# Patient Record
Sex: Male | Born: 1935
Health system: Southern US, Community
[De-identification: ages and names within clinical notes are randomized; demographics above are authoritative.]

## PROBLEM LIST (undated history)

## (undated) DIAGNOSIS — I209 Angina pectoris, unspecified: Secondary | ICD-10-CM

## (undated) DIAGNOSIS — I714 Abdominal aortic aneurysm, without rupture, unspecified: Secondary | ICD-10-CM

## (undated) DIAGNOSIS — G4700 Insomnia, unspecified: Secondary | ICD-10-CM

## (undated) DIAGNOSIS — F419 Anxiety disorder, unspecified: Secondary | ICD-10-CM

## (undated) DIAGNOSIS — M1990 Unspecified osteoarthritis, unspecified site: Secondary | ICD-10-CM

## (undated) DIAGNOSIS — K635 Polyp of colon: Secondary | ICD-10-CM

## (undated) DIAGNOSIS — C801 Malignant (primary) neoplasm, unspecified: Secondary | ICD-10-CM

## (undated) DIAGNOSIS — K4090 Unilateral inguinal hernia, without obstruction or gangrene, not specified as recurrent: Secondary | ICD-10-CM

## (undated) DIAGNOSIS — Z8719 Personal history of other diseases of the digestive system: Secondary | ICD-10-CM

## (undated) DIAGNOSIS — E785 Hyperlipidemia, unspecified: Secondary | ICD-10-CM

## (undated) DIAGNOSIS — K5730 Diverticulosis of large intestine without perforation or abscess without bleeding: Secondary | ICD-10-CM

## (undated) DIAGNOSIS — J449 Chronic obstructive pulmonary disease, unspecified: Secondary | ICD-10-CM

## (undated) HISTORY — DX: Hyperlipidemia, unspecified: E78.5

## (undated) HISTORY — PX: HERNIA REPAIR: SHX51

## (undated) HISTORY — DX: Polyp of colon: K63.5

## (undated) HISTORY — DX: Diverticulosis of large intestine without perforation or abscess without bleeding: K57.30

## (undated) HISTORY — DX: Abdominal aortic aneurysm, without rupture: I71.4

## (undated) HISTORY — DX: Anxiety disorder, unspecified: F41.9

## (undated) HISTORY — DX: Insomnia, unspecified: G47.00

## (undated) HISTORY — PX: OTHER SURGICAL HISTORY: SHX169

## (undated) HISTORY — DX: Abdominal aortic aneurysm, without rupture, unspecified: I71.40

## (undated) HISTORY — DX: Unspecified osteoarthritis, unspecified site: M19.90

---

## 1960-10-23 HISTORY — PX: CHOLECYSTECTOMY: SHX55

## 1960-10-23 HISTORY — PX: VASECTOMY: SHX75

## 2005-10-23 HISTORY — PX: COLON SURGERY: SHX602

## 2009-10-23 HISTORY — PX: NASAL SEPTUM SURGERY: SHX37

## 2011-10-31 DIAGNOSIS — F329 Major depressive disorder, single episode, unspecified: Secondary | ICD-10-CM | POA: Diagnosis not present

## 2011-11-09 DIAGNOSIS — F329 Major depressive disorder, single episode, unspecified: Secondary | ICD-10-CM | POA: Diagnosis not present

## 2011-11-24 DIAGNOSIS — F329 Major depressive disorder, single episode, unspecified: Secondary | ICD-10-CM | POA: Diagnosis not present

## 2011-12-08 DIAGNOSIS — F329 Major depressive disorder, single episode, unspecified: Secondary | ICD-10-CM | POA: Diagnosis not present

## 2011-12-14 DIAGNOSIS — H26499 Other secondary cataract, unspecified eye: Secondary | ICD-10-CM | POA: Diagnosis not present

## 2011-12-22 DIAGNOSIS — F329 Major depressive disorder, single episode, unspecified: Secondary | ICD-10-CM | POA: Diagnosis not present

## 2012-01-05 DIAGNOSIS — F329 Major depressive disorder, single episode, unspecified: Secondary | ICD-10-CM | POA: Diagnosis not present

## 2012-01-12 DIAGNOSIS — F329 Major depressive disorder, single episode, unspecified: Secondary | ICD-10-CM | POA: Diagnosis not present

## 2012-03-28 DIAGNOSIS — F329 Major depressive disorder, single episode, unspecified: Secondary | ICD-10-CM | POA: Diagnosis not present

## 2012-04-08 DIAGNOSIS — I6529 Occlusion and stenosis of unspecified carotid artery: Secondary | ICD-10-CM | POA: Diagnosis not present

## 2012-04-08 DIAGNOSIS — I714 Abdominal aortic aneurysm, without rupture: Secondary | ICD-10-CM | POA: Diagnosis not present

## 2012-04-15 DIAGNOSIS — I2510 Atherosclerotic heart disease of native coronary artery without angina pectoris: Secondary | ICD-10-CM | POA: Diagnosis not present

## 2012-04-23 DIAGNOSIS — S6990XA Unspecified injury of unspecified wrist, hand and finger(s), initial encounter: Secondary | ICD-10-CM | POA: Diagnosis not present

## 2012-04-23 DIAGNOSIS — S6980XA Other specified injuries of unspecified wrist, hand and finger(s), initial encounter: Secondary | ICD-10-CM | POA: Diagnosis not present

## 2012-04-23 DIAGNOSIS — S61409A Unspecified open wound of unspecified hand, initial encounter: Secondary | ICD-10-CM | POA: Diagnosis not present

## 2012-04-25 DIAGNOSIS — Z4801 Encounter for change or removal of surgical wound dressing: Secondary | ICD-10-CM | POA: Diagnosis not present

## 2012-05-03 DIAGNOSIS — Z4802 Encounter for removal of sutures: Secondary | ICD-10-CM | POA: Diagnosis not present

## 2012-06-12 DIAGNOSIS — L219 Seborrheic dermatitis, unspecified: Secondary | ICD-10-CM | POA: Diagnosis not present

## 2012-06-12 DIAGNOSIS — D485 Neoplasm of uncertain behavior of skin: Secondary | ICD-10-CM | POA: Diagnosis not present

## 2012-06-12 DIAGNOSIS — L821 Other seborrheic keratosis: Secondary | ICD-10-CM | POA: Diagnosis not present

## 2012-06-12 DIAGNOSIS — B079 Viral wart, unspecified: Secondary | ICD-10-CM | POA: Diagnosis not present

## 2012-06-13 DIAGNOSIS — H26499 Other secondary cataract, unspecified eye: Secondary | ICD-10-CM | POA: Diagnosis not present

## 2012-06-18 DIAGNOSIS — E7800 Pure hypercholesterolemia, unspecified: Secondary | ICD-10-CM | POA: Diagnosis not present

## 2012-06-18 DIAGNOSIS — N181 Chronic kidney disease, stage 1: Secondary | ICD-10-CM | POA: Diagnosis not present

## 2012-06-27 DIAGNOSIS — Z6832 Body mass index (BMI) 32.0-32.9, adult: Secondary | ICD-10-CM | POA: Diagnosis not present

## 2012-06-27 DIAGNOSIS — Z0000 Encounter for general adult medical examination without abnormal findings: Secondary | ICD-10-CM | POA: Diagnosis not present

## 2012-06-27 DIAGNOSIS — H6120 Impacted cerumen, unspecified ear: Secondary | ICD-10-CM | POA: Diagnosis not present

## 2012-06-27 DIAGNOSIS — R9431 Abnormal electrocardiogram [ECG] [EKG]: Secondary | ICD-10-CM | POA: Diagnosis not present

## 2012-06-28 DIAGNOSIS — M19049 Primary osteoarthritis, unspecified hand: Secondary | ICD-10-CM | POA: Diagnosis not present

## 2012-07-11 DIAGNOSIS — Z23 Encounter for immunization: Secondary | ICD-10-CM | POA: Diagnosis not present

## 2012-07-29 DIAGNOSIS — L821 Other seborrheic keratosis: Secondary | ICD-10-CM | POA: Diagnosis not present

## 2012-07-29 DIAGNOSIS — L219 Seborrheic dermatitis, unspecified: Secondary | ICD-10-CM | POA: Diagnosis not present

## 2012-09-03 DIAGNOSIS — L219 Seborrheic dermatitis, unspecified: Secondary | ICD-10-CM | POA: Diagnosis not present

## 2012-09-04 DIAGNOSIS — M7120 Synovial cyst of popliteal space [Baker], unspecified knee: Secondary | ICD-10-CM | POA: Diagnosis not present

## 2012-09-05 DIAGNOSIS — M79609 Pain in unspecified limb: Secondary | ICD-10-CM | POA: Diagnosis not present

## 2012-11-22 ENCOUNTER — Encounter (HOSPITAL_COMMUNITY): Payer: Self-pay | Admitting: *Deleted

## 2012-11-22 ENCOUNTER — Emergency Department (INDEPENDENT_AMBULATORY_CARE_PROVIDER_SITE_OTHER)
Admission: EM | Admit: 2012-11-22 | Discharge: 2012-11-22 | Disposition: A | Discharge status: Home / Self Care | Payer: Medicare Other | Source: Home / Self Care | Attending: Family Medicine | Admitting: Family Medicine

## 2012-11-22 DIAGNOSIS — S335XXA Sprain of ligaments of lumbar spine, initial encounter: Secondary | ICD-10-CM | POA: Diagnosis not present

## 2012-11-22 MED ORDER — IBUPROFEN 800 MG PO TABS: 800.0000 mg | ORAL_TABLET | Freq: Two times a day (BID) | ORAL | Status: DC

## 2012-11-22 MED ORDER — CYCLOBENZAPRINE HCL 5 MG PO TABS: 5.0000 mg | ORAL_TABLET | Freq: Three times a day (TID) | ORAL | Status: DC | PRN

## 2012-11-22 NOTE — ED Notes (Signed)
Pt  Reports  Low  Back   Pain     X  1  Week   He     Reports   He          Lifted  Heavy  Object  1  Week  Ago  Pain on  Movement  And  posistion

## 2012-11-22 NOTE — ED Provider Notes (Signed)
History     CSN: [id number]  Arrival date & time 11/22/12  1107   First MD Initiated Contact with Patient 11/22/12 1153      Chief Complaint  Patient presents with  . Back Pain    (Consider location/radiation/quality/duration/timing/severity/associated sxs/prior treatment) Patient is a 79 y.o. male presenting with back pain. The history is provided by the patient.  Back Pain  This is a new problem. The current episode started more than 2 days ago. The problem has not changed since onset.The pain is associated with lifting heavy objects (reaching nito closet to lift fireproof box and felt sudden low back pain, no gi or gu sx, no radiation of pain.). The pain is present in the lumbar spine. The quality of the pain is described as stabbing. The pain does not radiate. The pain is mild. The symptoms are aggravated by certain positions, twisting and bending. Pertinent negatives include no chest pain, no fever, no numbness, no abdominal pain, no bowel incontinence, no perianal numbness, no bladder incontinence, no pelvic pain, no leg pain, no paresthesias and no tingling. The treatment provided mild relief. Risk factors include lack of exercise.    History reviewed. No pertinent past medical history.  History reviewed. No pertinent past surgical history.  No family history on file.  History  Substance Use Topics  . Smoking status: Not on file  . Smokeless tobacco: Not on file  . Alcohol Use: Not on file      Review of Systems  Constitutional: Negative.  Negative for fever.  Cardiovascular: Negative for chest pain.  Gastrointestinal: Negative.  Negative for abdominal pain and bowel incontinence.  Genitourinary: Negative.  Negative for bladder incontinence and pelvic pain.  Musculoskeletal: Positive for back pain. Negative for myalgias, joint swelling and gait problem.  Neurological: Negative for tingling, numbness and paresthesias.    Allergies  Review of patient's allergies  indicates no known allergies.  Home Medications   Current Outpatient Rx  Name  Route  Sig  Dispense  Refill  . CITALOPRAM HYDROBROMIDE 10 MG PO TABS   Oral   Take 10 mg by mouth daily.         Marland Kitchen LORAZEPAM PO   Oral   Take by mouth.         Marland Kitchen SIMVASTATIN PO   Oral   Take by mouth.         . CYCLOBENZAPRINE HCL 5 MG PO TABS   Oral   Take 1 tablet (5 mg total) by mouth 3 (three) times daily as needed for muscle spasms.   30 tablet   0   . IBUPROFEN 800 MG PO TABS   Oral   Take 1 tablet (800 mg total) by mouth 2 (two) times daily with a meal.   30 tablet   0     BP 146/84  Pulse 74  Temp 98.5 F (36.9 C) (Oral)  Resp 22  SpO2 96%  Physical Exam  Nursing note and vitals reviewed. Constitutional: He is oriented to person, place, and time. He appears well-developed and well-nourished.  Abdominal: Soft. He exhibits no distension. There is no tenderness. There is no rebound and no guarding.  Musculoskeletal: He exhibits tenderness.       Lumbar back: He exhibits decreased range of motion, tenderness, pain and spasm. He exhibits no bony tenderness, no deformity and normal pulse.  Neurological: He is alert and oriented to person, place, and time.  Skin: Skin is warm and dry.  ED Course  Procedures (including critical care time)  Labs Reviewed - No data to display No results found.   1. Lumbar back sprain       MDM  Pt agrees that x-rays not indicated.        Linna Hoff, MD 11/22/12 1242

## 2012-12-18 DIAGNOSIS — E785 Hyperlipidemia, unspecified: Secondary | ICD-10-CM | POA: Diagnosis not present

## 2013-01-12 ENCOUNTER — Other Ambulatory Visit: Payer: Self-pay | Admitting: Nurse Practitioner

## 2013-01-13 ENCOUNTER — Other Ambulatory Visit: Payer: Self-pay | Admitting: *Deleted

## 2013-01-13 ENCOUNTER — Other Ambulatory Visit: Payer: Self-pay | Admitting: Geriatric Medicine

## 2013-01-13 ENCOUNTER — Other Ambulatory Visit: Payer: Self-pay | Admitting: Nurse Practitioner

## 2013-01-13 MED ORDER — TEMAZEPAM 30 MG PO CAPS: ORAL_CAPSULE | ORAL | Status: DC

## 2013-01-13 MED ORDER — CITALOPRAM HYDROBROMIDE 40 MG PO TABS: ORAL_TABLET | ORAL | Status: DC

## 2013-01-17 ENCOUNTER — Other Ambulatory Visit: Payer: Self-pay | Admitting: *Deleted

## 2013-01-17 DIAGNOSIS — E785 Hyperlipidemia, unspecified: Secondary | ICD-10-CM

## 2013-01-17 DIAGNOSIS — G4700 Insomnia, unspecified: Secondary | ICD-10-CM

## 2013-01-27 ENCOUNTER — Other Ambulatory Visit: Payer: Self-pay

## 2013-01-29 ENCOUNTER — Encounter: Payer: Self-pay | Admitting: Internal Medicine

## 2013-01-29 ENCOUNTER — Ambulatory Visit (INDEPENDENT_AMBULATORY_CARE_PROVIDER_SITE_OTHER): Payer: Medicare Other | Admitting: Internal Medicine

## 2013-01-29 VITALS — BP 118/78 | HR 60 | Temp 97.8°F | Resp 16 | Ht 71.0 in | Wt 197.0 lb

## 2013-01-29 DIAGNOSIS — N62 Hypertrophy of breast: Secondary | ICD-10-CM | POA: Diagnosis not present

## 2013-01-29 DIAGNOSIS — E785 Hyperlipidemia, unspecified: Secondary | ICD-10-CM | POA: Diagnosis not present

## 2013-01-29 DIAGNOSIS — F329 Major depressive disorder, single episode, unspecified: Secondary | ICD-10-CM | POA: Diagnosis not present

## 2013-01-29 DIAGNOSIS — F411 Generalized anxiety disorder: Secondary | ICD-10-CM

## 2013-01-29 DIAGNOSIS — F3289 Other specified depressive episodes: Secondary | ICD-10-CM

## 2013-01-29 NOTE — Progress Notes (Signed)
  Subjective:    Patient ID: Andrew Vega, male    DOB: 1926/01/21, 79 y.o.   MRN: [id number]  Chief Complaint  Patient presents with  . Anxiety  . Hyperlipidemia   HPI Patient here for routine follow up. Follows with Candelaria Celeste, NP.  He has noticed his left breast to be enlarging. Denies any pain or redness. No discharge reported. No known trauma. Denies any other complaints this visit. Reviewed his medications.   Review of Systems  Constitutional: Negative for fever and chills.  HENT: Positive for postnasal drip. Negative for congestion and rhinorrhea.   Eyes: Negative for visual disturbance.  Respiratory: Negative for cough, shortness of breath and wheezing.   Cardiovascular: Negative for chest pain, palpitations and leg swelling.  Gastrointestinal: Negative for nausea, vomiting, abdominal pain and constipation.  Genitourinary: Positive for urgency. Negative for dysuria, frequency and difficulty urinating.       Known history of enlarged prostate  Musculoskeletal: Negative for arthralgias.  Skin: Negative for rash and wound.  Neurological: Negative for dizziness, seizures and syncope.  Hematological: Negative for adenopathy.  Psychiatric/Behavioral: Negative for suicidal ideas, sleep disturbance, self-injury and agitation. The patient is nervous/anxious.        Objective:   Physical Exam  Constitutional: He is oriented to person, place, and time. He appears well-developed and well-nourished. No distress.  HENT:  Head: Normocephalic and atraumatic.  Mouth/Throat: Oropharynx is clear and moist.  Eyes: Conjunctivae are normal. Pupils are equal, round, and reactive to light.  Neck: Normal range of motion. Neck supple.  Cardiovascular: Normal rate and regular rhythm.   Pulmonary/Chest: Effort normal and breath sounds normal.  gynaecomastia in left breast. No redness. No tenderness  Abdominal: Soft. Bowel sounds are normal.  Musculoskeletal: Normal range of motion. He  exhibits no edema and no tenderness.  Neurological: He is alert and oriented to person, place, and time.  Skin: Skin is warm and dry. No rash noted. He is not diaphoretic.  Psychiatric: He has a normal mood and affect.   BP 118/78  Pulse 60  Temp(Src) 97.8 F (36.6 C) (Oral)  Resp 16  Ht 5\' 11"  (1.803 m)  Wt 197 lb (89.359 kg)  BMI 27.49 kg/m2  SpO2 97%       Assessment & Plan:   Anxiety- continue current regimen on temazepam and lorazepam prn.  Depression- on celexa and tolerating it well. Continue current regimen  gynaecomastia- new finding. No medication to contribute to this. Will get left breast usg to rule out mass/ cyst  Hyperlipidemia- continue simvastatin for now. Will need fasting labs prior to next visit for review

## 2013-01-31 NOTE — Addendum Note (Signed)
Addended by: Buena Irish A on: 01/31/2013 04:30 PM   Modules accepted: Orders

## 2013-02-13 ENCOUNTER — Ambulatory Visit
Admission: RE | Admit: 2013-02-13 | Discharge: 2013-02-13 | Disposition: A | Discharge status: Home / Self Care | Payer: Medicare Other | Source: Ambulatory Visit | Attending: Internal Medicine | Admitting: Internal Medicine

## 2013-02-13 DIAGNOSIS — N62 Hypertrophy of breast: Secondary | ICD-10-CM

## 2013-03-03 DIAGNOSIS — F4322 Adjustment disorder with anxiety: Secondary | ICD-10-CM | POA: Diagnosis not present

## 2013-03-12 ENCOUNTER — Other Ambulatory Visit: Payer: Self-pay | Admitting: *Deleted

## 2013-03-12 DIAGNOSIS — F4322 Adjustment disorder with anxiety: Secondary | ICD-10-CM | POA: Diagnosis not present

## 2013-03-12 MED ORDER — SIMVASTATIN 40 MG PO TABS: ORAL_TABLET | ORAL | Status: DC

## 2013-03-19 DIAGNOSIS — F4322 Adjustment disorder with anxiety: Secondary | ICD-10-CM | POA: Diagnosis not present

## 2013-03-20 ENCOUNTER — Ambulatory Visit (INDEPENDENT_AMBULATORY_CARE_PROVIDER_SITE_OTHER): Payer: Medicare Other | Admitting: Nurse Practitioner

## 2013-03-20 ENCOUNTER — Encounter: Payer: Self-pay | Admitting: Nurse Practitioner

## 2013-03-20 VITALS — BP 138/90 | HR 76 | Temp 97.0°F | Resp 14 | Ht 71.0 in | Wt 199.0 lb

## 2013-03-20 DIAGNOSIS — G4700 Insomnia, unspecified: Secondary | ICD-10-CM

## 2013-03-20 DIAGNOSIS — R5381 Other malaise: Secondary | ICD-10-CM

## 2013-03-20 DIAGNOSIS — F411 Generalized anxiety disorder: Secondary | ICD-10-CM

## 2013-03-20 DIAGNOSIS — E785 Hyperlipidemia, unspecified: Secondary | ICD-10-CM | POA: Insufficient documentation

## 2013-03-20 DIAGNOSIS — R5383 Other fatigue: Secondary | ICD-10-CM | POA: Diagnosis not present

## 2013-03-20 DIAGNOSIS — J309 Allergic rhinitis, unspecified: Secondary | ICD-10-CM

## 2013-03-20 DIAGNOSIS — I714 Abdominal aortic aneurysm, without rupture, unspecified: Secondary | ICD-10-CM

## 2013-03-20 DIAGNOSIS — Z1211 Encounter for screening for malignant neoplasm of colon: Secondary | ICD-10-CM

## 2013-03-20 MED ORDER — MOMETASONE FUROATE 50 MCG/ACT NA SUSP: 2.0000 | Freq: Every day | NASAL | Status: DC

## 2013-03-20 MED ORDER — SILDENAFIL CITRATE 50 MG PO TABS: ORAL_TABLET | ORAL | Status: DC

## 2013-03-20 NOTE — Patient Instructions (Signed)
Will order [place] of aneurysm.  Will get referral to GI to do follow up colonoscopy  Will decreased celexa to 1/2 tablet for 1 week then stop Will start Viibryd titration pack for anxiety  To come back and get fasting blood work- nothing to eat or drink except water and black coffee after midnight the night before.   To follow up in 1 month regarding anxiety

## 2013-03-20 NOTE — Assessment & Plan Note (Signed)
History of AAA- however have not seen records from previous office-- will get follow up US to cont to monitor aneurysm

## 2013-03-20 NOTE — Progress Notes (Signed)
Patient ID: Andrew Vega, male   DOB: 03-05-1926, 79 y.o.   MRN: [id number]   No Known Allergies  Chief Complaint  Patient presents with  . Medical Managment of Chronic Issues    sleeping problems    HPI: Patient is a 79 y.o. male seen in the office today for extended visit.  Anxiety is worse due to taking care of wife and her advanced dementia with behaviors. Reports he feels overwhelmed by her and the care. Has caregivers which help. Is now seeing a therapist to help him cope.  Has a history of AA and yearly screening was due jan 2014 Blood pressure normally 120s/80s  Has been having increased seasonal allergies- OTCs were not effective has used nasonex in the past Review of Systems:  Review of Systems  Constitutional: Negative for fever, chills and malaise/fatigue.  HENT: Negative for ear pain, nosebleeds, congestion and sore throat.   Eyes: Negative for blurred vision.  Respiratory: Negative for cough, sputum production and shortness of breath.   Cardiovascular: Negative for chest pain and palpitations.  Gastrointestinal: Negative for heartburn, abdominal pain, diarrhea and constipation.  Genitourinary: Negative for dysuria, urgency and frequency.  Musculoskeletal: Negative for myalgias and joint pain.  Skin: Negative for itching and rash.  Neurological: Negative for dizziness, tingling, tremors, weakness and headaches.  Endo/Heme/Allergies: Positive for environmental allergies.  Psychiatric/Behavioral: Negative for depression. The patient is nervous/anxious and has insomnia.      Past Medical History  Diagnosis Date  . Anxiety   . Hyperlipidemia   . Abdominal aneurysm without mention of rupture   . Insomnia, unspecified    Past Surgical History  Procedure Laterality Date  . Vasectomy    . Cholecystectomy    . Colon surgery     Social History:   reports that he has quit smoking. He does not have any smokeless tobacco history on file. He reports that he drinks about  0.6 ounces of alcohol per week. He reports that he does not use illicit drugs.  No family history on file.  Medications: Patient's Medications  New Prescriptions   MOMETASONE (NASONEX) 50 MCG/ACT NASAL SPRAY    Place 2 sprays into the nose daily.   SILDENAFIL (VIAGRA) 50 MG TABLET    1/2 tablet to 1 tablet as needed daily for ED  Previous Medications   ASPIRIN 81 MG TABLET    Take 81 mg by mouth daily.   CITALOPRAM (CELEXA) 40 MG TABLET    Take one tablet by mouth daily.   LORAZEPAM PO    Take 0.5 mg by mouth.    NIACIN 100 MG TABLET    Take 100 mg by mouth. Take 3 tablets by mouth every morning with breakfast.   SIMVASTATIN (ZOCOR) 40 MG TABLET    Take one tablet once a day at bedtime for cholesterol supplement   TEMAZEPAM (RESTORIL) 30 MG CAPSULE    Take one capsule by mouth once daily at bedtime for rest  Modified Medications   No medications on file  Discontinued Medications   IBUPROFEN (ADVIL,MOTRIN) 800 MG TABLET    Take 1 tablet (800 mg total) by mouth 2 (two) times daily with a meal.     Physical Exam:  Filed Vitals:   03/20/13 1137  BP: 138/90  Pulse: 76  Temp: 97 F (36.1 C)  TempSrc: Oral  Resp: 14  Height: 5\' 11"  (1.803 m)  Weight: 199 lb (90.266 kg)    Physical Exam  Constitutional: He is oriented to person,  place, and time. He appears well-developed and well-nourished. No distress.  HENT:  Head: Normocephalic and atraumatic.  Right Ear: External ear normal.  Left Ear: External ear normal.  Nose: Nose normal.  Mouth/Throat: No oropharyngeal exudate.  Eyes: Conjunctivae and EOM are normal. Pupils are equal, round, and reactive to light.  Neck: Normal range of motion. Neck supple. No thyromegaly present.  Cardiovascular: Normal rate, regular rhythm, normal heart sounds and intact distal pulses.   Pulmonary/Chest: Effort normal and breath sounds normal.  Abdominal: Soft. Bowel sounds are normal. He exhibits no distension. There is no tenderness.   Genitourinary: Rectum normal. Prostate is enlarged.  Musculoskeletal: Normal range of motion. He exhibits no edema and no tenderness.  Neurological: He is alert and oriented to person, place, and time. He has normal reflexes. No cranial nerve deficit. Coordination normal.  Skin: Skin is warm and dry. He is not diaphoretic.  Psychiatric: He has a normal mood and affect.       Assessment/Plan Insomnia Waking up in the middle of the night and watching TV without sound and just staying up for about 30 mins. Worse due to increase in anxiety. Pt can take Melatoin 3 mg qhs for sleep  Generalized anxiety disorder Wife with advanced dementia with behviors and pt has a lot of anxiety and stress due to this-- decrease energy level; started going to therapy to help with the stress and fatigue. He reports his anxiety has improved due to this however would like to try different medications. Will titrate celexa off and start viibryd titration pack. To follow up in 4 weeks  Other and unspecified hyperlipidemia Not fasting- Will have him come for fasting labs next week  AAA (abdominal aortic aneurysm) without rupture History of AAA- however have not seen records from previous office-- will get follow up US to cont to monitor aneurysm   Allergic rhinitis Has uses nasonex in the past with good result. Will give Rx at this time. OTCs were not effective     Other malaise and fatigue 780.79   -- due to caregiver role strain and increase anxiety- will check labs and changed medications to help anxiety  Screening for colon cancer - will refer to GI for follow up colonoscopy   Labs/tests ordered TSH, fasting lipids, CMP, CBC

## 2013-03-20 NOTE — Assessment & Plan Note (Addendum)
Waking up in the middle of the night and watching TV without sound and just staying up for about 30 mins. Worse due to increase in anxiety. Pt can take Melatoin 3 mg qhs for sleep

## 2013-03-20 NOTE — Assessment & Plan Note (Signed)
Has uses nasonex in the past with good result. Will give Rx at this time. OTCs were not effective

## 2013-03-20 NOTE — Assessment & Plan Note (Addendum)
Wife with advanced dementia with behviors and pt has a lot of anxiety and stress due to this-- decrease energy level; started going to therapy to help with the stress and fatigue. He reports his anxiety has improved due to this however would like to try different medications. Will titrate celexa off and start viibryd titration pack. To follow up in 4 weeks

## 2013-03-20 NOTE — Assessment & Plan Note (Signed)
Not fasting- Will have him come for fasting labs next week

## 2013-03-20 NOTE — Progress Notes (Signed)
Passed clock drawing. Given by Salem Caster, RMA.

## 2013-03-21 ENCOUNTER — Encounter: Payer: Self-pay | Admitting: Gastroenterology

## 2013-04-02 ENCOUNTER — Telehealth: Payer: Self-pay

## 2013-04-02 NOTE — Telephone Encounter (Signed)
Pt appt left as scheduled

## 2013-04-02 NOTE — Telephone Encounter (Signed)
Message copied by Donata Duff on Wed Apr 02, 2013 11:45 AM ------      Message from: Rob Bunting P      Created: Wed Apr 02, 2013 11:37 AM       Happy to see him in office to discuss whether or not colon cancer screening is still relevant for him.                  ----- Message -----         From: Donata Duff, CMA         Sent: 04/02/2013  11:27 AM           To: Rachael Fee, MD            Dr Christella Hartigan this pt has been scheduled for eval for screening colon he is 57 does he need this appt?       ------

## 2013-04-03 DIAGNOSIS — F4322 Adjustment disorder with anxiety: Secondary | ICD-10-CM | POA: Diagnosis not present

## 2013-04-07 ENCOUNTER — Encounter (INDEPENDENT_AMBULATORY_CARE_PROVIDER_SITE_OTHER): Payer: Medicare Other

## 2013-04-07 DIAGNOSIS — I714 Abdominal aortic aneurysm, without rupture, unspecified: Secondary | ICD-10-CM

## 2013-04-07 DIAGNOSIS — I723 Aneurysm of iliac artery: Secondary | ICD-10-CM | POA: Diagnosis not present

## 2013-04-10 DIAGNOSIS — F4322 Adjustment disorder with anxiety: Secondary | ICD-10-CM | POA: Diagnosis not present

## 2013-04-11 ENCOUNTER — Other Ambulatory Visit: Payer: Medicare Other

## 2013-04-11 DIAGNOSIS — R5383 Other fatigue: Secondary | ICD-10-CM

## 2013-04-11 DIAGNOSIS — E785 Hyperlipidemia, unspecified: Secondary | ICD-10-CM

## 2013-04-12 LAB — LIPID PANEL
Chol/HDL Ratio: 3.2 ratio units (ref 0.0–5.0)
HDL: 50 mg/dL (ref >39)
LDL Calculated: 86 mg/dL (ref 0–99)
Triglycerides: 121 mg/dL (ref 0–149)
VLDL Cholesterol Cal: 24 mg/dL (ref 5–40)

## 2013-04-12 LAB — CBC WITH DIFFERENTIAL/PLATELET
Basophils Absolute: 0.1 10*3/uL (ref 0.0–0.2)
Eos: 6 % — ABNORMAL HIGH (ref 0–5)
HCT: 40.8 % (ref 37.5–51.0)
Immature Grans (Abs): 0 10*3/uL (ref 0.0–0.1)
Immature Granulocytes: 0 % (ref 0–2)
Lymphocytes Absolute: 1.9 10*3/uL (ref 0.7–3.1)
MCHC: 35.5 g/dL (ref 31.5–35.7)
Monocytes: 9 % (ref 4–12)
Neutrophils Relative %: 59 % (ref 40–74)
RDW: 14.4 % (ref 12.3–15.4)
WBC: 7.6 10*3/uL (ref 3.4–10.8)

## 2013-04-12 LAB — COMPREHENSIVE METABOLIC PANEL
AST: 24 IU/L (ref 0–40)
Albumin: 4.3 g/dL (ref 3.5–4.7)
Alkaline Phosphatase: 73 IU/L (ref 39–117)
BUN/Creatinine Ratio: 12 (ref 10–22)
BUN: 17 mg/dL (ref 8–27)
CO2: 26 mmol/L (ref 18–29)
Calcium: 9.3 mg/dL (ref 8.6–10.2)
Chloride: 104 mmol/L (ref 97–108)
Creatinine, Ser: 1.4 mg/dL — ABNORMAL HIGH (ref 0.76–1.27)
Sodium: 143 mmol/L (ref 134–144)

## 2013-04-14 ENCOUNTER — Ambulatory Visit (INDEPENDENT_AMBULATORY_CARE_PROVIDER_SITE_OTHER): Payer: Medicare Other | Admitting: Gastroenterology

## 2013-04-14 ENCOUNTER — Encounter: Payer: Self-pay | Admitting: Gastroenterology

## 2013-04-14 VITALS — BP 140/80 | HR 70 | Ht 67.0 in | Wt 202.0 lb

## 2013-04-14 DIAGNOSIS — Z86010 Personal history of colonic polyps: Secondary | ICD-10-CM | POA: Diagnosis not present

## 2013-04-14 NOTE — Patient Instructions (Addendum)
We will get records sent from your previous gastroenterologist for review (Dr. Estelle Grumbles in [facility], [place] [phone].  This will include any endoscopic (colonoscopy or upper endoscopy) procedures and any associated pathology reports.   Will decide on timing of colonoscopy after that review. Start fiber supplement powder every day.                                              We are excited to introduce MyChart, a new best-in-class service that provides you online access to important information in your electronic medical record. We want to make it easier for you to view your health information - all in one secure location - when and where you need it. We expect MyChart will enhance the quality of care and service we provide.  When you register for MyChart, you can:    View your test results.    Request appointments and receive appointment reminders via email.    Request medication renewals.    View your medical history, allergies, medications and immunizations.    Communicate with your physician's office through a password-protected site.    Conveniently print information such as your medication lists.  To find out if MyChart is right for you, please talk to a member of our clinical staff today. We will gladly answer your questions about this free health and wellness tool.  If you are age 33 or older and want a member of your family to have access to your record, you must provide written consent by completing a proxy form available at our office. Please speak to our clinical staff about guidelines regarding accounts for patients younger than age 87.  As you activate your MyChart account and need any technical assistance, please call the MyChart technical support line at [phone]) or email your question to [email or web address]. If you email your question(s), please include your name, a return phone number and the best time to reach you.  If you have  non-urgent health-related questions, you can send a message to our office through MyChart at [email or web address]. If you have a medical emergency, call 911.  Thank you for using MyChart as your new health and wellness resource!   MyChart licensed from [employer],  6213-0865. Patents Pending.

## 2013-04-14 NOTE — Progress Notes (Signed)
HPI: This is a   very pleasant 79 year old man whom I am meeting for the first time today.  He recently moved from [place] with his wife who has Alzheimer's disease to be closer to family   He was recently seen by his primary care physician and was recommended to consider colon cancer screening and was sent here. Labs in the last month show that he is not anemic.  He had colonoscopies in   Colon resection surgically, long time ago for polyp that could not be removed endoscopically..  This was about 2007 years ago in [facility].  Multiple colonoscopies, last was 2010, p    Review of systems: Pertinent positive and negative review of systems were noted in the above HPI section. Complete review of systems was performed and was otherwise normal.    Past Medical History  Diagnosis Date  . Anxiety   . Hyperlipidemia   . Abdominal aneurysm without mention of rupture   . Insomnia, unspecified   . Colon polyps   . Arthritis   . Diverticulosis of colon (without mention of hemorrhage)   . Sleep apnea     Past Surgical History  Procedure Laterality Date  . Vasectomy    . Cholecystectomy    . Colon surgery      Current Outpatient Prescriptions  Medication Sig Dispense Refill  . aspirin 81 MG tablet Take 81 mg by mouth daily.      . B Complex-C (SUPER B COMPLEX PO) Take by mouth daily.      . Cholecalciferol (VITAMIN D) 2000 UNITS CAPS Take by mouth daily.      . citalopram (CELEXA) 40 MG tablet Take one tablet by mouth daily.  30 tablet  3  . Glucosamine-Chondroit-Vit C-Mn (GLUCOSAMINE CHONDR 1500 COMPLX PO) Take 1 capsule by mouth daily.      Marland Kitchen LORAZEPAM PO Take 0.5 mg by mouth.       . mometasone (NASONEX) 50 MCG/ACT nasal spray Place 2 sprays into the nose daily.  17 g  12  . Multiple Vitamin (MULTIVITAMIN) tablet Take 1 tablet by mouth daily.      . niacin 100 MG tablet Take 100 mg by mouth. Take 3 tablets by mouth every morning with breakfast.      . Omega-3 Fatty Acids (FISH  OIL) 1000 MG CAPS Take 1 capsule by mouth daily.      . sildenafil (VIAGRA) 50 MG tablet 1/2 tablet to 1 tablet as needed daily for ED  15 tablet  0  . simvastatin (ZOCOR) 40 MG tablet Take one tablet once a day at bedtime for cholesterol supplement  90 tablet  3  . temazepam (RESTORIL) 30 MG capsule Take one capsule by mouth once daily at bedtime for rest  30 capsule  0  . Vilazodone HCl (VIIBRYD) 40 MG TABS Take 40 mg by mouth daily.      . vitamin A 45409 UNIT capsule Take 10,000 Units by mouth daily.      . vitamin E 400 UNIT capsule Take 400 Units by mouth daily.       No current facility-administered medications for this visit.    Allergies as of 04/14/2013 - Review Complete 04/14/2013  Allergen Reaction Noted  . Codeine  04/14/2013    Family History  Problem Relation Age of Onset  . Colon cancer Neg Hx     History   Social History  . Marital Status: Married    Spouse Name: N/A  Number of Children: N/A  . Years of Education: N/A   Occupational History  . Not on file.   Social History Main Topics  . Smoking status: Former Games developer  . Smokeless tobacco: Never Used  . Alcohol Use: 0.6 oz/week    1 Glasses of wine per week  . Drug Use: No  . Sexually Active: Not on file   Other Topics Concern  . Not on file   Social History Narrative  . No narrative on file       Physical Exam: BP 140/80  Pulse 70  Ht 5\' 7"  (1.702 m)  Wt 202 lb (91.627 kg)  BMI 31.63 kg/m2 Constitutional: generally well-appearing Psychiatric: alert and oriented x3 Eyes: extraocular movements intact Mouth: oral pharynx moist, no lesions Neck: supple no lymphadenopathy Cardiovascular: heart regular rate and rhythm Lungs: clear to auscultation bilaterally Abdomen: soft, nontender, nondistended, no obvious ascites, no peritoneal signs, normal bowel sounds Extremities: no lower extremity edema bilaterally Skin: no lesions on visible extremities    Assessment and plan: 79 y.o. male  with  personal history of colon polyps, unclear pathology  We will get records sent over from his previous gastroenterologist. His last colonoscopy was in 2010 and he recalls that it was normal, no polyps were found. He did have previous polyps that I believe were adenomatous however I do not have that documentation here for review. He would not generally be due for polyp surveillance colonoscopy until 5 years after his last procedure which feet 2015. He is 79 years old but he is remarkably vibrant and vital. His mother's side of the family lived well into their 90s usually and so I think colon cancer screening, colon polyp surveillance is still a relevant question for him.

## 2013-04-15 ENCOUNTER — Ambulatory Visit (INDEPENDENT_AMBULATORY_CARE_PROVIDER_SITE_OTHER): Payer: Medicare Other | Admitting: Nurse Practitioner

## 2013-04-15 ENCOUNTER — Encounter: Payer: Self-pay | Admitting: Nurse Practitioner

## 2013-04-15 VITALS — BP 130/88 | HR 67 | Temp 97.7°F | Resp 14 | Ht 67.0 in | Wt 201.0 lb

## 2013-04-15 DIAGNOSIS — J309 Allergic rhinitis, unspecified: Secondary | ICD-10-CM

## 2013-04-15 DIAGNOSIS — H1045 Other chronic allergic conjunctivitis: Secondary | ICD-10-CM

## 2013-04-15 DIAGNOSIS — G4700 Insomnia, unspecified: Secondary | ICD-10-CM

## 2013-04-15 DIAGNOSIS — I714 Abdominal aortic aneurysm, without rupture: Secondary | ICD-10-CM | POA: Diagnosis not present

## 2013-04-15 DIAGNOSIS — F411 Generalized anxiety disorder: Secondary | ICD-10-CM

## 2013-04-15 DIAGNOSIS — H1013 Acute atopic conjunctivitis, bilateral: Secondary | ICD-10-CM

## 2013-04-15 MED ORDER — SILDENAFIL CITRATE 50 MG PO TABS: ORAL_TABLET | ORAL | Status: DC

## 2013-04-15 MED ORDER — OLOPATADINE HCL 0.2 % OP SOLN: 1.0000 [drp] | Freq: Every day | OPHTHALMIC | Status: DC

## 2013-04-15 MED ORDER — VILAZODONE HCL 40 MG PO TABS: 40.0000 mg | ORAL_TABLET | Freq: Every day | ORAL | Status: DC

## 2013-04-15 NOTE — Patient Instructions (Signed)
Cont Viibryd 40 mg daily for anxiety  Melatonin 3 mg at night to help with sleep  For allergies: Start eye drop daily claritin (loratadine) 10 mg daily  mucinex DM 1 tablet twice a day with a full glass of water to help with congestion  Hay Fever  Hay fever is a type of allergy that people have to things like grass, animals, or pollen from plants and flowers. It cannot be passed from one person to another. You cannot cure hay fever, but there are things that may help relieve your problems (symptoms). HOME CARE  Avoid the things that may be causing your problems.  Take all medicine as told by your doctor. GET HELP RIGHT AWAY IF:  You have asthma, a cough, and you start making whistling sounds when breathing (wheezing).  Your tongue or lips are puffy (swollen).  You have trouble breathing.  You feel lightheaded or like you will pass out (faint).  You have a fever.  Your problems are getting worse and your medicine is not helping.  Your treatment was working, but your problems have come back.  You are stuffed up (congested) and have pressure in your face.  You have a headache.  You have cold sweats. MAKE SURE YOU:  Understand these instructions.  Will watch your condition.  Will get help right away if you are not doing well or get worse. Document Released: 02/08/2011 Document Revised: 01/01/2012 Document Reviewed: 02/08/2011 [facility] Patient Information 2014 [place], [place].

## 2013-04-15 NOTE — Progress Notes (Signed)
Patient ID: Andrew Vega, male   DOB: 03-08-1926, 79 y.o.   MRN: [id number]   Allergies  Allergen Reactions  . Codeine     Severe stomach pain     Chief Complaint  Patient presents with  . Medical Managment of Chronic Issues    skin irritation on left ear, eyes burning/discharge, chest congestion    HPI: Patient is a 79 y.o. male seen in the office today for follow up on anxiety however he reports he is having several issues.  Red eye that has been going on several weeks. Eyes slightly burn; no changes in vision; minimal amounts of discharge -- no notable color; minimal itching previously had many allergies but is now having cough that is nonproductive and some nasal and congestion. No sore throat, fevers, chills or shortness of breath  Rough dry skin spot on left ear  Never got melatonin for sleep-- still awaking at night; Started viibryd which has been helping with his anxiety and mood   Review of Systems:  Review of Systems  Constitutional: Negative for fever, chills and malaise/fatigue.  HENT: Positive for congestion. Negative for hearing loss, sore throat and tinnitus.   Eyes: Negative for blurred vision, double vision and pain.  Respiratory: Positive for cough. Negative for sputum production, shortness of breath and wheezing.   Cardiovascular: Negative for chest pain.  Gastrointestinal: Negative for abdominal pain, diarrhea and constipation.  Genitourinary: Negative for dysuria.  Musculoskeletal: Negative for myalgias.  Skin: Positive for itching.       Itchy flaking skin on inside of penna of left ear  Neurological: Negative for dizziness, weakness and headaches.  Psychiatric/Behavioral: Positive for depression. Negative for suicidal ideas. The patient is nervous/anxious and has insomnia.      Past Medical History  Diagnosis Date  . Anxiety   . Hyperlipidemia   . Abdominal aneurysm without mention of rupture   . Insomnia, unspecified   . Colon polyps   .  Arthritis   . Diverticulosis of colon (without mention of hemorrhage)   . Sleep apnea    Past Surgical History  Procedure Laterality Date  . Vasectomy    . Cholecystectomy    . Colon surgery     Social History:   reports that he has quit smoking. He has never used smokeless tobacco. He reports that he drinks about 0.6 ounces of alcohol per week. He reports that he does not use illicit drugs.  Family History  Problem Relation Age of Onset  . Colon cancer Neg Hx     Medications: Patient's Medications  New Prescriptions   No medications on file  Previous Medications   ASPIRIN 81 MG TABLET    Take 81 mg by mouth daily.   B COMPLEX-C (SUPER B COMPLEX PO)    Take by mouth daily.   CHOLECALCIFEROL (VITAMIN D) 2000 UNITS CAPS    Take by mouth daily.   GLUCOSAMINE-CHONDROIT-VIT C-MN (GLUCOSAMINE CHONDR 1500 COMPLX PO)    Take 1 capsule by mouth daily.   LORAZEPAM PO    Take 0.5 mg by mouth.    MOMETASONE (NASONEX) 50 MCG/ACT NASAL SPRAY    Place 2 sprays into the nose daily.   MULTIPLE VITAMIN (MULTIVITAMIN) TABLET    Take 1 tablet by mouth daily.   NIACIN 100 MG TABLET    Take 250 mg by mouth daily with breakfast.    OMEGA-3 FATTY ACIDS (FISH OIL) 1000 MG CAPS    Take 1 capsule by mouth daily.  SIMVASTATIN (ZOCOR) 40 MG TABLET    Take one tablet once a day at bedtime for cholesterol supplement   TEMAZEPAM (RESTORIL) 30 MG CAPSULE    Take one capsule by mouth once daily at bedtime for rest   VILAZODONE HCL (VIIBRYD) 40 MG TABS    Take 40 mg by mouth daily.   VITAMIN A 19147 UNIT CAPSULE    Take 10,000 Units by mouth daily.   VITAMIN E 400 UNIT CAPSULE    Take 400 Units by mouth daily.  Modified Medications   Modified Medication Previous Medication   SILDENAFIL (VIAGRA) 50 MG TABLET sildenafil (VIAGRA) 50 MG tablet      1/2 tablet to 1 tablet as needed daily for ED    1/2 tablet to 1 tablet as needed daily for ED  Discontinued Medications   CITALOPRAM (CELEXA) 40 MG TABLET    Take  one tablet by mouth daily.     Physical Exam:  Filed Vitals:   04/15/13 0856  BP: 130/88  Pulse: 67  Temp: 97.7 F (36.5 C)  TempSrc: Oral  Resp: 14  Height: 5\' 7"  (1.702 m)  Weight: 201 lb (91.173 kg)  SpO2: 96%    Physical Exam  Nursing note and vitals reviewed. Constitutional: He is oriented to person, place, and time and well-developed, well-nourished, and in no distress. No distress.  HENT:  Head: Normocephalic and atraumatic.  Eyes: EOM are normal. Pupils are equal, round, and reactive to light. No foreign bodies found. Right eye exhibits no discharge. Left eye exhibits no discharge. Right conjunctiva is injected. Left conjunctiva is injected.  Neck: Normal range of motion. Neck supple.  Cardiovascular: Normal rate, regular rhythm and normal heart sounds.   Pulmonary/Chest: Effort normal and breath sounds normal. No respiratory distress.  Abdominal: Soft. Bowel sounds are normal.  Neurological: He is alert and oriented to person, place, and time.  Skin: Skin is warm and dry. He is not diaphoretic.  Dry skin on inside of penna  Psychiatric: Affect normal.     Labs reviewed: Basic Metabolic Panel:  Recent Labs  82/95/62 0916  NA 143  K 4.2  CL 104  CO2 26  GLUCOSE 101*  BUN 17  CREATININE 1.40*  CALCIUM 9.3  TSH 1.680   Liver Function Tests:  Recent Labs  04/11/13 0916  AST 24  ALT 21  ALKPHOS 73  BILITOT 0.5  PROT 7.1   No results found for this basename: LIPASE, AMYLASE,  in the last 8760 hours No results found for this basename: AMMONIA,  in the last 8760 hours CBC:  Recent Labs  04/11/13 0916  WBC 7.6  NEUTROABS 4.5  HGB 14.5  HCT 40.8  MCV 90   Lipid Panel:  Recent Labs  04/11/13 0916  HDL 50  LDLCALC 86  TRIG 121  CHOLHDL 3.2     Assessment/Plan    1.   AAA (abdominal aortic aneurysm) without rupture 441.4     Had ultrasound- remains stable- follow up in 6 month   2.   Allergic rhinitis with allergic  conjunctiva  To take claritin 10 mg daily   Pataday eye drops 1 drop into both eyes daily   3.   Generalized anxiety disorder   Improved on viibryd- to cont Viibryd    4.   Insomnia   To get melatonin 3 mg q hs

## 2013-04-17 DIAGNOSIS — F4322 Adjustment disorder with anxiety: Secondary | ICD-10-CM | POA: Diagnosis not present

## 2013-04-28 ENCOUNTER — Other Ambulatory Visit: Payer: Medicare Other

## 2013-04-29 ENCOUNTER — Other Ambulatory Visit: Payer: Self-pay | Admitting: Nurse Practitioner

## 2013-04-30 ENCOUNTER — Ambulatory Visit (INDEPENDENT_AMBULATORY_CARE_PROVIDER_SITE_OTHER): Payer: Medicare Other | Admitting: Nurse Practitioner

## 2013-04-30 ENCOUNTER — Ambulatory Visit: Payer: Medicare Other | Admitting: Internal Medicine

## 2013-04-30 VITALS — BP 130/82 | HR 101 | Temp 98.4°F | Resp 18 | Ht 67.0 in | Wt 197.6 lb

## 2013-04-30 DIAGNOSIS — G4700 Insomnia, unspecified: Secondary | ICD-10-CM | POA: Diagnosis not present

## 2013-04-30 DIAGNOSIS — F411 Generalized anxiety disorder: Secondary | ICD-10-CM | POA: Diagnosis not present

## 2013-04-30 MED ORDER — TEMAZEPAM 15 MG PO CAPS: 15.0000 mg | ORAL_CAPSULE | Freq: Every evening | ORAL | Status: DC | PRN

## 2013-04-30 MED ORDER — TRAZODONE HCL 50 MG PO TABS: 25.0000 mg | ORAL_TABLET | Freq: Every day | ORAL | Status: DC

## 2013-04-30 NOTE — Progress Notes (Signed)
Patient ID: Andrew Vega, male   DOB: 04-Oct-1926, 79 y.o.   MRN: [id number]   Allergies  Allergen Reactions  . Codeine     Severe stomach pain     Chief Complaint  Patient presents with  . Follow-up    medication issue, vein question    HPI: Patient is a 79 y.o. male seen in the office today because he is not sleeping at night He has been on temazepam for many years to help sleep. Started taking this when his wife got diagnosis with dementia.  Still waking up around 2-3 in the morning and be awake for around an hr before he go back to sleep or he gets up at 5 am and can not go back to sleep Increased stress due to progression of wife's dementia and behaviors   viibryd has been helping with his anxiety; he is feeling calmer with increase stress and tension Review of Systems:  Review of Systems  Constitutional: Negative for fever, chills, malaise/fatigue and diaphoresis.  Respiratory: Negative for shortness of breath.   Cardiovascular: Negative for chest pain.  Gastrointestinal: Negative for heartburn, diarrhea and constipation.  Skin: Negative for itching and rash.  Neurological: Negative for weakness.  Psychiatric/Behavioral: The patient is nervous/anxious and has insomnia.      Past Medical History  Diagnosis Date  . Anxiety   . Hyperlipidemia   . Abdominal aneurysm without mention of rupture   . Insomnia, unspecified   . Colon polyps   . Arthritis   . Diverticulosis of colon (without mention of hemorrhage)   . Sleep apnea    Past Surgical History  Procedure Laterality Date  . Vasectomy    . Cholecystectomy    . Colon surgery     Social History:   reports that he has quit smoking. He has never used smokeless tobacco. He reports that he drinks about 0.6 ounces of alcohol per week. He reports that he does not use illicit drugs.  Family History  Problem Relation Age of Onset  . Colon cancer Neg Hx     Medications: Patient's Medications  New Prescriptions    No medications on file  Previous Medications   ASPIRIN 81 MG TABLET    Take 81 mg by mouth daily.   B COMPLEX-C (SUPER B COMPLEX PO)    Take by mouth daily.   CHOLECALCIFEROL (VITAMIN D) 2000 UNITS CAPS    Take by mouth daily.   GLUCOSAMINE-CHONDROIT-VIT C-MN (GLUCOSAMINE CHONDR 1500 COMPLX PO)    Take 1 capsule by mouth daily.   LORAZEPAM PO    Take 0.25 mg by mouth as needed.    MOMETASONE (NASONEX) 50 MCG/ACT NASAL SPRAY    Place 2 sprays into the nose daily.   MULTIPLE VITAMIN (MULTIVITAMIN) TABLET    Take 1 tablet by mouth daily.   NIACIN 100 MG TABLET    Take 250 mg by mouth daily with breakfast.    OLOPATADINE HCL 0.2 % SOLN    Apply 1 drop to eye daily.   OMEGA-3 FATTY ACIDS (FISH OIL) 1000 MG CAPS    Take 1 capsule by mouth daily.   SILDENAFIL (VIAGRA) 50 MG TABLET    1/2 tablet to 1 tablet as needed daily for ED   SIMVASTATIN (ZOCOR) 40 MG TABLET    Take one tablet once a day at bedtime for cholesterol supplement   TEMAZEPAM (RESTORIL) 30 MG CAPSULE    TAKE 1 CAPSULE BY MOUTH EVERY NIGHT AT BEDTIME   VILAZODONE  HCL (VIIBRYD) 40 MG TABS    Take 1 tablet (40 mg total) by mouth daily.   VITAMIN E 400 UNIT CAPSULE    Take 400 Units by mouth daily.  Modified Medications   No medications on file  Discontinued Medications   VITAMIN A 16109 UNIT CAPSULE    Take 10,000 Units by mouth daily.     Physical Exam:  Filed Vitals:   04/30/13 1418  BP: 130/82  Pulse: 101  Temp: 98.4 F (36.9 C)  TempSrc: Oral  Resp: 18  Height: 5\' 7"  (1.702 m)  Weight: 197 lb 9.6 oz (89.631 kg)  SpO2: 95%    Physical Exam  Constitutional: He is oriented to person, place, and time and well-developed, well-nourished, and in no distress. No distress.  HENT:  Head: Normocephalic and atraumatic.  Eyes: Conjunctivae and EOM are normal. Pupils are equal, round, and reactive to light.  Neck: Normal range of motion. Neck supple.  Cardiovascular: Normal rate, regular rhythm and normal heart sounds.    Pulmonary/Chest: Effort normal and breath sounds normal. No respiratory distress.  Abdominal: Soft. Bowel sounds are normal.  Neurological: He is alert and oriented to person, place, and time.  Skin: Skin is warm and dry. He is not diaphoretic.  Psychiatric: Memory and affect normal.     Labs reviewed: Basic Metabolic Panel:  Recent Labs  60/45/40 0916  NA 143  K 4.2  CL 104  CO2 26  GLUCOSE 101*  BUN 17  CREATININE 1.40*  CALCIUM 9.3  TSH 1.680   Liver Function Tests:  Recent Labs  04/11/13 0916  AST 24  ALT 21  ALKPHOS 73  BILITOT 0.5  PROT 7.1   No results found for this basename: LIPASE, AMYLASE,  in the last 8760 hours No results found for this basename: AMMONIA,  in the last 8760 hours CBC:  Recent Labs  04/11/13 0916  WBC 7.6  NEUTROABS 4.5  HGB 14.5  HCT 40.8  MCV 90   Lipid Panel:  Recent Labs  04/11/13 0916  HDL 50  LDLCALC 86  TRIG 121  CHOLHDL 3.2      Assessment/Plan   1.   Insomnia 780.52     Worse; will start trazodone 25 mg qhs and may increase to 50 mg nightly   On temazepam which is not effective- will decrease to 15 mg qhs and then dc after 2 weeks   2.   Generalized anxiety disorder   Improved on viibryd

## 2013-04-30 NOTE — Patient Instructions (Addendum)
Will start trazodone 25-50 mg nightly for sleep (start with 25 mg nightly and if needed increase to 50)   Decrease temazepam down from 30 mg to 15 mg nightly for 2 weeks then stop  Keep follow up with Dr Renato Gails but follow up before if needed

## 2013-05-01 ENCOUNTER — Ambulatory Visit: Payer: Medicare Other | Admitting: Internal Medicine

## 2013-05-05 ENCOUNTER — Other Ambulatory Visit: Payer: Self-pay | Admitting: Geriatric Medicine

## 2013-05-05 DIAGNOSIS — F4322 Adjustment disorder with anxiety: Secondary | ICD-10-CM | POA: Diagnosis not present

## 2013-05-05 MED ORDER — TEMAZEPAM 15 MG PO CAPS: 15.0000 mg | ORAL_CAPSULE | Freq: Every evening | ORAL | Status: DC | PRN

## 2013-05-13 DIAGNOSIS — F4322 Adjustment disorder with anxiety: Secondary | ICD-10-CM | POA: Diagnosis not present

## 2013-05-14 ENCOUNTER — Telehealth: Payer: Self-pay | Admitting: Geriatric Medicine

## 2013-05-14 NOTE — Telephone Encounter (Signed)
What dose is he taking

## 2013-05-14 NOTE — Telephone Encounter (Signed)
Trazodone and temazepam are not helping for sleep. Can you prescribe something else?

## 2013-05-21 DIAGNOSIS — F4322 Adjustment disorder with anxiety: Secondary | ICD-10-CM | POA: Diagnosis not present

## 2013-05-23 ENCOUNTER — Other Ambulatory Visit: Payer: Self-pay | Admitting: Geriatric Medicine

## 2013-05-23 MED ORDER — LORAZEPAM 1 MG PO TABS: 1.0000 mg | ORAL_TABLET | Freq: Every day | ORAL | Status: DC

## 2013-05-26 DIAGNOSIS — F4322 Adjustment disorder with anxiety: Secondary | ICD-10-CM | POA: Diagnosis not present

## 2013-05-30 ENCOUNTER — Telehealth: Payer: Self-pay | Admitting: Geriatric Medicine

## 2013-05-30 NOTE — Progress Notes (Signed)
Patient called and left me a message with some medication questions and concerns. I tried to call him back and I did not get an answer.

## 2013-06-03 ENCOUNTER — Encounter: Payer: Self-pay | Admitting: Internal Medicine

## 2013-06-03 ENCOUNTER — Ambulatory Visit (INDEPENDENT_AMBULATORY_CARE_PROVIDER_SITE_OTHER): Payer: Medicare Other | Admitting: Internal Medicine

## 2013-06-03 VITALS — BP 124/68 | HR 63 | Temp 96.8°F | Resp 14 | Wt 201.0 lb

## 2013-06-03 DIAGNOSIS — F411 Generalized anxiety disorder: Secondary | ICD-10-CM | POA: Diagnosis not present

## 2013-06-03 DIAGNOSIS — G4700 Insomnia, unspecified: Secondary | ICD-10-CM

## 2013-06-03 MED ORDER — LORAZEPAM 1 MG PO TABS: 1.0000 mg | ORAL_TABLET | Freq: Every day | ORAL | Status: DC | PRN

## 2013-06-03 NOTE — Progress Notes (Signed)
Patient ID: Andrew Vega, male   DOB: [birth year], 79 y.o.   MRN: [id number]  Chief Complaint  Patient presents with  . Sleeping Problem    HPI 79 y/o male patient is here with complaints of problem with her sleep Not taking temazepam- tapered off it Not taking vibryid- due to cost issue Taking trazodone for a month now  He goes to bed at 10-10:30 pm. He goes to sleep within half an hour of lying in bed and gets up by 4- 4:30 am. He is able to sleep through but is concerned with early awakening. After he wakes up he feels stressed about his current living situation, taking care of his wife with dementia and other stuffs and this keeps him restless and tired. He would like to sleep for longer duration  ROS Denies fever or chills Denies nausea or vomiting Denies increased urinary frequency Denies abdominal pain  Allergies  Allergen Reactions  . Codeine     Severe stomach pain    Past Medical History  Diagnosis Date  . Anxiety   . Hyperlipidemia   . Abdominal aneurysm without mention of rupture   . Insomnia, unspecified   . Colon polyps   . Arthritis   . Diverticulosis of colon (without mention of hemorrhage)   . Sleep apnea    BP 124/68  Pulse 63  Temp(Src) 96.8 F (36 C) (Oral)  Resp 14  Wt 201 lb (91.173 kg)  BMI 31.47 kg/m2  Physical Exam  Constitutional: He is oriented to person, place, and time and well-developed, well-nourished, and in no distress. No distress.  HENT:   Head: Normocephalic and atraumatic.  Eyes: Conjunctivae and EOM are normal. Pupils are equal, round, and reactive to light.  Neck: Normal range of motion. Neck supple.  Cardiovascular: Normal rate, regular rhythm and normal heart sounds.   Pulmonary/Chest: Effort normal and breath sounds normal. No respiratory distress.  Abdominal: Soft. Bowel sounds are normal.  Neurological: He is alert and oriented to person, place, and time.  Skin: Skin is warm and dry. He is not diaphoretic.   Psychiatric: Memory and affect normal.    Assessment/plan  Early awakening- continue trazodone for now to help with his mood and sleep. Sleep hygiene discussed in detail. Also talked about decreased REM sleep cycle with age and that this is somewhat expected. Will tr to avoid BZD to prevent clouding of his thinking during daytime and fall risk increase  Anxiety- lost his ativan script. Will provide script for ativan 1 mg daily as needed for his anxiety for now

## 2013-06-04 ENCOUNTER — Ambulatory Visit: Payer: Self-pay | Admitting: Nurse Practitioner

## 2013-06-05 DIAGNOSIS — B351 Tinea unguium: Secondary | ICD-10-CM | POA: Diagnosis not present

## 2013-06-05 DIAGNOSIS — M79609 Pain in unspecified limb: Secondary | ICD-10-CM | POA: Diagnosis not present

## 2013-06-09 DIAGNOSIS — F4322 Adjustment disorder with anxiety: Secondary | ICD-10-CM | POA: Diagnosis not present

## 2013-06-16 DIAGNOSIS — F4322 Adjustment disorder with anxiety: Secondary | ICD-10-CM | POA: Diagnosis not present

## 2013-06-25 ENCOUNTER — Other Ambulatory Visit: Payer: Self-pay

## 2013-06-25 MED ORDER — LORAZEPAM 1 MG PO TABS: 1.0000 mg | ORAL_TABLET | Freq: Every day | ORAL | Status: DC | PRN

## 2013-06-25 NOTE — Telephone Encounter (Signed)
Patient stopped by the office to request new rx for Ativan, patient would like dispense number of #90 vs #30, patient states prior to last refill he always received #90.  Patient aware I will send message to PCP to advise on dispense number change of a controlled medication  Please advise (pharmacy verified), last OV 06/03/13, last filled 05/23/2013 #30

## 2013-06-26 DIAGNOSIS — F4322 Adjustment disorder with anxiety: Secondary | ICD-10-CM | POA: Diagnosis not present

## 2013-06-30 DIAGNOSIS — F4322 Adjustment disorder with anxiety: Secondary | ICD-10-CM | POA: Diagnosis not present

## 2013-07-07 DIAGNOSIS — F4322 Adjustment disorder with anxiety: Secondary | ICD-10-CM | POA: Diagnosis not present

## 2013-07-10 ENCOUNTER — Ambulatory Visit (INDEPENDENT_AMBULATORY_CARE_PROVIDER_SITE_OTHER): Payer: Medicare Other | Admitting: Internal Medicine

## 2013-07-10 ENCOUNTER — Encounter: Payer: Self-pay | Admitting: Internal Medicine

## 2013-07-10 VITALS — BP 132/80 | HR 76 | Wt 198.0 lb

## 2013-07-10 DIAGNOSIS — Z23 Encounter for immunization: Secondary | ICD-10-CM | POA: Diagnosis not present

## 2013-07-10 DIAGNOSIS — G4700 Insomnia, unspecified: Secondary | ICD-10-CM

## 2013-07-10 DIAGNOSIS — F411 Generalized anxiety disorder: Secondary | ICD-10-CM

## 2013-07-10 MED ORDER — LORAZEPAM 1 MG PO TABS: ORAL_TABLET | ORAL | Status: DC

## 2013-07-10 MED ORDER — CITALOPRAM HYDROBROMIDE 20 MG PO TABS: 20.0000 mg | ORAL_TABLET | Freq: Every day | ORAL | Status: DC

## 2013-07-10 NOTE — Patient Instructions (Signed)
Stop trazodone Start citalopram (celexa) 20mg  nightly.

## 2013-07-10 NOTE — Progress Notes (Signed)
Patient ID: Andrew Vega, male   DOB: 1926-04-08, 79 y.o.   MRN: [id number] Location:  [facility]   Allergies  Allergen Reactions  . Celebrex [Celecoxib]     Raised B/P   . Codeine     Severe stomach pain     Chief Complaint  Patient presents with  . Medical Managment of Chronic Issues    3 month follow-up   . Sleeping Problem    problem staying asleep, usually wakes up about 4 am     HPI: Patient is a 79 y.o. white male seen in the office today for insomnia.   Has stress of his wife's AD Has interrupted sleep nightly--falls asleep easily at 1030-11am, then awakens at 4am.  Sits and watches tv or sneaks a piece of tylenol 1/4 tablet, then sleeps until 7am.  Energy level is affected dramatically int he day time.   Has just started going to the fitness center where he lives.  Doing a little bit of weights.  Needs more training on this. Does not have sleep apnea diagnosed.  Used to snore, but does not anymore.   Rarely takes naps only if exhausted.   Very limited caffeine--has decaf coffee or soda.   Does not have to get up because of urination.   Takes lorazepam 1mg , trazodone and melatonin 3mg , but still not sleeping.   Viibryd was too expensive. Did take celexa at one point.  Does still have it, but stopped it.    Review of Systems:  ROS   Past Medical History  Diagnosis Date  . Anxiety   . Hyperlipidemia   . Abdominal aneurysm without mention of rupture   . Insomnia, unspecified   . Colon polyps   . Arthritis   . Diverticulosis of colon (without mention of hemorrhage)   . Sleep apnea     Past Surgical History  Procedure Laterality Date  . Vasectomy    . Cholecystectomy    . Colon surgery      Social History:   reports that he has quit smoking. He started smoking about 28 years ago. He has never used smokeless tobacco. He reports that he drinks about 0.6 ounces of alcohol per week. He reports that he does not use  illicit drugs.  Family History  Problem Relation Age of Onset  . Colon cancer Neg Hx     Medications: Patient's Medications  New Prescriptions   No medications on file  Previous Medications   ASPIRIN 81 MG TABLET    Take 81 mg by mouth daily.   B COMPLEX-C (SUPER B COMPLEX PO)    Take by mouth daily.   CHOLECALCIFEROL (VITAMIN D) 2000 UNITS CAPS    Take by mouth daily.   GLUCOSAMINE-CHONDROIT-VIT C-MN (GLUCOSAMINE CHONDR 1500 COMPLX PO)    Take 1 capsule by mouth daily.   MELATONIN 3 MG TABS    Take by mouth at bedtime.   MOMETASONE (NASONEX) 50 MCG/ACT NASAL SPRAY    Place 2 sprays into the nose daily.   MULTIPLE VITAMIN (MULTIVITAMIN) TABLET    Take 1 tablet by mouth daily.   NIACIN 100 MG TABLET    Take 250 mg by mouth daily with breakfast.    OLOPATADINE HCL 0.2 % SOLN    Apply 1 drop to eye daily.   OMEGA-3 FATTY ACIDS (FISH OIL) 1000 MG CAPS    Take 1 capsule by mouth daily.   SILDENAFIL (VIAGRA) 50 MG TABLET  1/2 tablet to 1 tablet as needed daily for ED   SIMVASTATIN (ZOCOR) 40 MG TABLET    Take one tablet once a day at bedtime for cholesterol supplement   TRAZODONE (DESYREL) 50 MG TABLET    Take 0.5-1 tablets (25-50 mg total) by mouth at bedtime.   VITAMIN E 400 UNIT CAPSULE    Take 400 Units by mouth daily.  Modified Medications   Modified Medication Previous Medication   LORAZEPAM (ATIVAN) 1 MG TABLET LORazepam (ATIVAN) 1 MG tablet      Take 1 mg by mouth. 1 by mouth daily at night and during the day "AS NEEDED"    Take 1 tablet (1 mg total) by mouth daily as needed for anxiety.  Discontinued Medications   No medications on file     Physical Exam: Filed Vitals:   07/10/13 0958  BP: 132/80  Pulse: 76  Weight: 198 lb (89.812 kg)  Physical Exam  Labs reviewed: Basic Metabolic Panel:  Recent Labs  16/10/96 0916  NA 143  K 4.2  CL 104  CO2 26  GLUCOSE 101*  BUN 17  CREATININE 1.40*  CALCIUM 9.3  TSH 1.680   Liver Function Tests:  Recent Labs   04/11/13 0916  AST 24  ALT 21  ALKPHOS 73  BILITOT 0.5  PROT 7.1  CBC:  Recent Labs  04/11/13 0916  WBC 7.6  NEUTROABS 4.5  HGB 14.5  HCT 40.8  MCV 90   Lipid Panel:  Recent Labs  04/11/13 0916  HDL 50  LDLCALC 86  TRIG 121  CHOLHDL 3.2    Assessment/Plan 1. Need for prophylactic vaccination and inoculation against influenza -given flu vaccine today  2. Anxiety state, unspecified - LORazepam (ATIVAN) 1 MG tablet; 1 by mouth daily at night and 1/2 during the day "AS NEEDED"  Dispense: 45 tablet; Refill: 0--wants 90 day supply of this when he refills  -script not given today--I just changed instruction to how he takes it - citalopram (CELEXA) 20 MG tablet; Take 1 tablet (20 mg total) by mouth daily.  Dispense: 90 tablet; Refill: 3  3. Early awakening - likely a normal change of aging -explained that this should improve as he works out at [employer] -also resumed citalopram to help with his anxiety and stress from his wife's dementia -advised it may take one month for full effects of citalopram  Next appt: 3 mos with Shanda Bumps

## 2013-07-14 DIAGNOSIS — F4322 Adjustment disorder with anxiety: Secondary | ICD-10-CM | POA: Diagnosis not present

## 2013-07-28 DIAGNOSIS — F4322 Adjustment disorder with anxiety: Secondary | ICD-10-CM | POA: Diagnosis not present

## 2013-08-04 DIAGNOSIS — F4322 Adjustment disorder with anxiety: Secondary | ICD-10-CM | POA: Diagnosis not present

## 2013-08-11 DIAGNOSIS — F4322 Adjustment disorder with anxiety: Secondary | ICD-10-CM | POA: Diagnosis not present

## 2013-08-18 DIAGNOSIS — F4322 Adjustment disorder with anxiety: Secondary | ICD-10-CM | POA: Diagnosis not present

## 2013-08-22 ENCOUNTER — Telehealth: Payer: Self-pay | Admitting: Gastroenterology

## 2013-08-22 NOTE — Telephone Encounter (Signed)
Pt had chest pain and lower abd pain last night that lasted several hours.  Has a history of aneurysm.  No pain today.  He says that Dr Christella Hartigan advised him to let him know if he had any symptoms.

## 2013-08-22 NOTE — Telephone Encounter (Signed)
I reviewed note from this past summer.  Dont see mention of aneurysm discussion. He should go to ER if pain is severe or rov this afternoon with extender here.

## 2013-08-22 NOTE — Telephone Encounter (Signed)
Left message on machine to call back  

## 2013-08-23 ENCOUNTER — Encounter (HOSPITAL_COMMUNITY): Payer: Self-pay | Admitting: Emergency Medicine

## 2013-08-23 ENCOUNTER — Observation Stay (HOSPITAL_COMMUNITY): Payer: Medicare Other

## 2013-08-23 ENCOUNTER — Observation Stay (HOSPITAL_COMMUNITY)
Admission: EM | Admit: 2013-08-23 | Discharge: 2013-08-24 | Disposition: A | Discharge status: Home / Self Care | Payer: Medicare Other | Attending: Internal Medicine | Admitting: Internal Medicine

## 2013-08-23 ENCOUNTER — Emergency Department (HOSPITAL_COMMUNITY): Payer: Medicare Other

## 2013-08-23 DIAGNOSIS — R079 Chest pain, unspecified: Secondary | ICD-10-CM

## 2013-08-23 DIAGNOSIS — G479 Sleep disorder, unspecified: Secondary | ICD-10-CM | POA: Diagnosis not present

## 2013-08-23 DIAGNOSIS — Z888 Allergy status to other drugs, medicaments and biological substances status: Secondary | ICD-10-CM | POA: Diagnosis not present

## 2013-08-23 DIAGNOSIS — Z8719 Personal history of other diseases of the digestive system: Secondary | ICD-10-CM | POA: Diagnosis not present

## 2013-08-23 DIAGNOSIS — F411 Generalized anxiety disorder: Secondary | ICD-10-CM | POA: Diagnosis present

## 2013-08-23 DIAGNOSIS — R0789 Other chest pain: Principal | ICD-10-CM

## 2013-08-23 DIAGNOSIS — I714 Abdominal aortic aneurysm, without rupture, unspecified: Secondary | ICD-10-CM

## 2013-08-23 DIAGNOSIS — IMO0002 Reserved for concepts with insufficient information to code with codable children: Secondary | ICD-10-CM | POA: Insufficient documentation

## 2013-08-23 DIAGNOSIS — Z859 Personal history of malignant neoplasm, unspecified: Secondary | ICD-10-CM | POA: Diagnosis not present

## 2013-08-23 DIAGNOSIS — Z87891 Personal history of nicotine dependence: Secondary | ICD-10-CM | POA: Diagnosis not present

## 2013-08-23 DIAGNOSIS — Z7982 Long term (current) use of aspirin: Secondary | ICD-10-CM | POA: Insufficient documentation

## 2013-08-23 DIAGNOSIS — Z886 Allergy status to analgesic agent status: Secondary | ICD-10-CM | POA: Diagnosis not present

## 2013-08-23 DIAGNOSIS — Z860100 Personal history of colon polyps, unspecified: Secondary | ICD-10-CM | POA: Insufficient documentation

## 2013-08-23 DIAGNOSIS — J449 Chronic obstructive pulmonary disease, unspecified: Secondary | ICD-10-CM | POA: Diagnosis not present

## 2013-08-23 DIAGNOSIS — Z8679 Personal history of other diseases of the circulatory system: Secondary | ICD-10-CM | POA: Diagnosis not present

## 2013-08-23 DIAGNOSIS — J438 Other emphysema: Secondary | ICD-10-CM | POA: Diagnosis not present

## 2013-08-23 DIAGNOSIS — G4730 Sleep apnea, unspecified: Secondary | ICD-10-CM | POA: Diagnosis not present

## 2013-08-23 DIAGNOSIS — Z79899 Other long term (current) drug therapy: Secondary | ICD-10-CM | POA: Insufficient documentation

## 2013-08-23 DIAGNOSIS — J4489 Other specified chronic obstructive pulmonary disease: Secondary | ICD-10-CM | POA: Insufficient documentation

## 2013-08-23 DIAGNOSIS — M129 Arthropathy, unspecified: Secondary | ICD-10-CM | POA: Insufficient documentation

## 2013-08-23 DIAGNOSIS — E785 Hyperlipidemia, unspecified: Secondary | ICD-10-CM | POA: Diagnosis present

## 2013-08-23 HISTORY — DX: Unilateral inguinal hernia, without obstruction or gangrene, not specified as recurrent: K40.90

## 2013-08-23 HISTORY — DX: Malignant (primary) neoplasm, unspecified: C80.1

## 2013-08-23 HISTORY — DX: Chronic obstructive pulmonary disease, unspecified: J44.9

## 2013-08-23 LAB — CBC
HCT: 41.1 % (ref 39.0–52.0)
MCV: 90.9 fL (ref 78.0–100.0)
Platelets: 172 10*3/uL (ref 150–400)
RBC: 4.52 MIL/uL (ref 4.22–5.81)
WBC: 7.5 10*3/uL (ref 4.0–10.5)

## 2013-08-23 LAB — POCT I-STAT TROPONIN I: Troponin i, poc: 0 ng/mL (ref 0.00–0.08)

## 2013-08-23 LAB — BASIC METABOLIC PANEL
CO2: 26 mEq/L (ref 19–32)
Chloride: 103 mEq/L (ref 96–112)
Creatinine, Ser: 1.21 mg/dL (ref 0.50–1.35)
Sodium: 139 mEq/L (ref 135–145)

## 2013-08-23 LAB — TROPONIN I
Troponin I: <0.3 ng/mL (ref <0.30)
Troponin I: <0.3 ng/mL (ref <0.30)

## 2013-08-23 MED ORDER — NITROGLYCERIN 0.4 MG SL SUBL: 0.4000 mg | SUBLINGUAL_TABLET | SUBLINGUAL | Status: DC | PRN

## 2013-08-23 MED ORDER — ASPIRIN 81 MG PO CHEW
324.0000 mg | CHEWABLE_TABLET | Freq: Once | ORAL | Status: AC
  Administered 2013-08-23: 324 mg via ORAL
  Filled 2013-08-23: qty 4

## 2013-08-23 MED ORDER — NITROGLYCERIN 0.4 MG SL SUBL
0.4000 mg | SUBLINGUAL_TABLET | SUBLINGUAL | Status: DC | PRN
  Administered 2013-08-23: 0.4 mg via SUBLINGUAL
  Filled 2013-08-23: qty 25

## 2013-08-23 MED ORDER — ONDANSETRON HCL 4 MG/2ML IJ SOLN: 4.0000 mg | Freq: Four times a day (QID) | INTRAMUSCULAR | Status: DC | PRN

## 2013-08-23 MED ORDER — ADULT MULTIVITAMIN W/MINERALS CH
1.0000 | ORAL_TABLET | Freq: Every day | ORAL | Status: DC
  Administered 2013-08-23 – 2013-08-24 (×2): 1 via ORAL
  Filled 2013-08-23 (×2): qty 1

## 2013-08-23 MED ORDER — SIMVASTATIN 20 MG PO TABS
20.0000 mg | ORAL_TABLET | Freq: Every day | ORAL | Status: DC
  Administered 2013-08-23: 20 mg via ORAL
  Filled 2013-08-23 (×2): qty 1

## 2013-08-23 MED ORDER — ACETAMINOPHEN 650 MG RE SUPP: 650.0000 mg | Freq: Four times a day (QID) | RECTAL | Status: DC | PRN

## 2013-08-23 MED ORDER — SODIUM CHLORIDE 0.9 % IV SOLN
INTRAVENOUS | Status: DC
  Administered 2013-08-23: 12:00:00 via INTRAVENOUS

## 2013-08-23 MED ORDER — ACETAMINOPHEN 325 MG PO TABS: 650.0000 mg | ORAL_TABLET | Freq: Four times a day (QID) | ORAL | Status: DC | PRN

## 2013-08-23 MED ORDER — PANTOPRAZOLE SODIUM 40 MG PO TBEC
40.0000 mg | DELAYED_RELEASE_TABLET | Freq: Two times a day (BID) | ORAL | Status: DC
  Administered 2013-08-23 – 2013-08-24 (×3): 40 mg via ORAL
  Filled 2013-08-23 (×3): qty 1

## 2013-08-23 MED ORDER — SODIUM CHLORIDE 0.9 % IJ SOLN
3.0000 mL | Freq: Two times a day (BID) | INTRAMUSCULAR | Status: DC
  Administered 2013-08-23 – 2013-08-24 (×3): 3 mL via INTRAVENOUS

## 2013-08-23 MED ORDER — IOHEXOL 350 MG/ML SOLN
75.0000 mL | Freq: Once | INTRAVENOUS | Status: AC | PRN
  Administered 2013-08-23: 75 mL via INTRAVENOUS

## 2013-08-23 MED ORDER — ONDANSETRON HCL 4 MG PO TABS: 4.0000 mg | ORAL_TABLET | Freq: Four times a day (QID) | ORAL | Status: DC | PRN

## 2013-08-23 MED ORDER — HYDROCODONE-ACETAMINOPHEN 5-325 MG PO TABS: 1.0000 | ORAL_TABLET | ORAL | Status: DC | PRN

## 2013-08-23 MED ORDER — CITALOPRAM HYDROBROMIDE 20 MG PO TABS
20.0000 mg | ORAL_TABLET | Freq: Every day | ORAL | Status: DC
  Administered 2013-08-23 – 2013-08-24 (×2): 20 mg via ORAL
  Filled 2013-08-23 (×2): qty 1

## 2013-08-23 MED ORDER — MORPHINE SULFATE 2 MG/ML IJ SOLN: 1.0000 mg | INTRAMUSCULAR | Status: DC | PRN

## 2013-08-23 MED ORDER — LORAZEPAM 1 MG PO TABS
1.0000 mg | ORAL_TABLET | Freq: Two times a day (BID) | ORAL | Status: DC | PRN
  Administered 2013-08-23: 1 mg via ORAL
  Filled 2013-08-23 (×2): qty 1

## 2013-08-23 NOTE — ED Notes (Signed)
Pt reports chest pain that started on Thursday, went away and then returned last night. Denies sob or nausea. ekg done at triage.

## 2013-08-23 NOTE — H&P (Signed)
[facility] History and Physical  Paxon Propes WUJ:[id number] DOB: June 09, 1926 DOA: 08/23/2013  Referring physician: Dr Rosario Jacks PCP: Bufford Spikes, DO  Specialists: None  Chief Complaint: Chest pain.   HPI: Andrew Vega is a 79 y.o. male with PMH significant for Hyperlipidemia, AAA, who presents to the ED complaining of chest pain that started 3 days prior to admission, intermittent, last for 1 hour, is a discomfort, pressure in quality, intensity 5/10, non radiating. Not associated with exertion. He has had 2 to 3 episodes. He had chest pain last night and this morning. Is pleuritic. Not associated with dyspnea, nausea or vomiting.   There was a note (10-31) from a phone call to  Dr Christella Hartigan office where Patient during that phone call relay abdominal pain also. Patient was refer to the ED for evaluation. Patient denies abdominal pain.   Review of Systems: Negative except as per HPI.   Past Medical History  Diagnosis Date  . Anxiety   . Hyperlipidemia   . Abdominal aneurysm without mention of rupture   . Insomnia, unspecified   . Colon polyps   . Arthritis   . Diverticulosis of colon (without mention of hemorrhage)   . Sleep apnea    Past Surgical History  Procedure Laterality Date  . Vasectomy    . Cholecystectomy    . Colon surgery     Social History:  reports that he has quit smoking. He started smoking about 28 years ago. He has never used smokeless tobacco. He reports that he drinks about 0.6 ounces of alcohol per week. He reports that he does not use illicit drugs.   Allergies  Allergen Reactions  . Celebrex [Celecoxib]     Raised B/P   . Codeine     Severe stomach pain     Family History  Problem Relation Age of Onset  . Colon cancer Neg Hx     Prior to Admission medications   Medication Sig Start Date End Date Taking? Authorizing Provider  aspirin 81 MG tablet Take 81 mg by mouth daily.   Yes Historical Provider, MD  B Complex-C (SUPER B COMPLEX PO)  Take by mouth daily.   Yes Historical Provider, MD  Cholecalciferol (VITAMIN D) 2000 UNITS CAPS Take by mouth daily.   Yes Historical Provider, MD  citalopram (CELEXA) 20 MG tablet Take 1 tablet (20 mg total) by mouth daily. 07/10/13  Yes Tiffany L Reed, DO  Glucosamine-Chondroit-Vit C-Mn (GLUCOSAMINE CHONDR 1500 COMPLX PO) Take 1 capsule by mouth daily.   Yes Historical Provider, MD  LORazepam (ATIVAN) 1 MG tablet 1 by mouth daily at night and 1/2 during the day "AS NEEDED" 07/10/13  Yes Tiffany L Reed, DO  Melatonin 3 MG TABS Take by mouth at bedtime.   Yes Historical Provider, MD  mometasone (NASONEX) 50 MCG/ACT nasal spray Place 2 sprays into the nose daily. 03/20/13  Yes Claudie Revering, NP  Multiple Vitamin (MULTIVITAMIN) tablet Take 1 tablet by mouth daily.   Yes Historical Provider, MD  niacin 100 MG tablet Take 250 mg by mouth daily with breakfast.    Yes Historical Provider, MD  Olopatadine HCl 0.2 % SOLN Apply 1 drop to eye daily. 04/15/13  Yes Claudie Revering, NP  Omega-3 Fatty Acids (FISH OIL) 1000 MG CAPS Take 1 capsule by mouth daily.   Yes Historical Provider, MD  sildenafil (VIAGRA) 50 MG tablet 1/2 tablet to 1 tablet as needed daily for ED 04/15/13  Yes Claudie Revering, NP  simvastatin (ZOCOR) 40 MG tablet Take one tablet once a day at bedtime for cholesterol supplement 03/12/13  Yes Kimber Relic, MD  vitamin E 400 UNIT capsule Take 400 Units by mouth daily.   Yes Historical Provider, MD   Physical Exam: Filed Vitals:   08/23/13 1026  BP: 111/72  Pulse: 66  Temp:   Resp: 14   General Appearance:    Alert, cooperative, no distress, appears stated age  Head:    Normocephalic, without obvious abnormality, atraumatic  Eyes:    PERRL, conjunctiva/corneas clear, EOM's intact.       Ears:    Normal TM's and external ear canals, both ears  Nose:   Nares normal, septum midline, mucosa normal, no drainage    or sinus tenderness  Throat:   Lips, mucosa, and tongue normal; teeth and  gums normal  Neck:   Supple, symmetrical, trachea midline, no adenopathy;       thyroid:  No enlargement/tenderness/nodules; no carotid   bruit or JVD  Back:     Symmetric, no curvature, ROM normal, no CVA tenderness  Lungs:     Clear to auscultation bilaterally, respirations unlabored  Chest wall:    No tenderness or deformity  Heart:    Regular rate and rhythm, S1 and S2 normal, no murmur, rub   or gallop  Abdomen:     Soft, non-tender, bowel sounds active all four quadrants,    no masses, no organomegaly  Genitalia:    Deferred.   Rectal:    Deferred.   Extremities:   Extremities normal, atraumatic, no cyanosis or edema  Pulses:   2+ and symmetric all extremities  Skin:   Skin color, texture, turgor normal, no rashes or lesions  Lymph nodes:   Cervical, supraclavicular, and axillary nodes normal  Neurologic:   CNII-XII intact. Normal strength, sensation and reflexes      throughout      Labs on Admission:  Basic Metabolic Panel:  Recent Labs Lab 08/23/13 0924  NA 139  K 3.9  CL 103  CO2 26  GLUCOSE 94  BUN 17  CREATININE 1.21  CALCIUM 9.2   Liver Function Tests: No results found for this basename: AST, ALT, ALKPHOS, BILITOT, PROT, ALBUMIN,  in the last 168 hours No results found for this basename: LIPASE, AMYLASE,  in the last 168 hours No results found for this basename: AMMONIA,  in the last 168 hours CBC:  Recent Labs Lab 08/23/13 0924  WBC 7.5  HGB 14.5  HCT 41.1  MCV 90.9  PLT 172   Cardiac Enzymes: No results found for this basename: CKTOTAL, CKMB, CKMBINDEX, TROPONINI,  in the last 168 hours  BNP (last 3 results) No results found for this basename: PROBNP,  in the last 8760 hours CBG: No results found for this basename: GLUCAP,  in the last 168 hours  Radiological Exams on Admission: Dg Chest 2 View  08/23/2013   CLINICAL DATA:  Sternal chest pain. Cardiac history.  EXAM: CHEST  2 VIEW  COMPARISON:  None.  FINDINGS: Emphysema is present. The  cardiopericardial silhouette is within normal limits. Tortuous thoracic aorta. Hyperinflation is noted on the lateral view. Basilar atelectasis and chronic bronchitic changes are noted. There is no airspace disease. No pleural effusion. There is a pulmonary nodule and the scars the calcified granuloma is present in the right middle lobe, seen on both frontal and lateral views. Multilevel thoracic spondylosis.  IMPRESSION: Emphysema without acute cardiopulmonary disease. Old granulomatous disease.  Electronically Signed   By: Andreas Newport M.D.   On: 08/23/2013 10:22    EKG: Independently reviewed. Sinus, incomplete RBBB.    Assessment/Plan Principal Problem:   Chest pain Active Problems:   Generalized anxiety disorder   Other and unspecified hyperlipidemia   AAA (abdominal aortic aneurysm) without rupture  1-Chest pain: pleuritic. Will admit patient to telemetry to rule out ACS. Will cycle cardiac enzymes. ECHO ordered. Nitroglycerin PRN. Repeat EKG in am. EKG with incomplete RBBB, prior EKG 2008: show RBBB. Will order CT chest angio and include abdomen due to prior history of AAA.   2-Hyperlipidemia: Continue with Statin.  3-Generalized anxiety disorder, Continue with Ativan PRN.  4-History of AAA: FU CT angio result.   Code Status: Full Code.  Family Communication: Care discussed with Patient.  Disposition Plan: Admit under observation, expect one midnight.   Time spent: 75 minutes.   REGALADO,BELKYS [facility] Pager [phone]  If 7PM-7AM, please contact night-coverage [email or web address] Password [email or web address] 08/23/2013, 11:40 AM

## 2013-08-23 NOTE — ED Provider Notes (Signed)
CSN: [id number]     Arrival date & time 08/23/13  0849 History   First MD Initiated Contact with Patient 08/23/13 0919     Chief Complaint  Patient presents with  . Chest Pain   (Consider location/radiation/quality/duration/timing/severity/associated sxs/prior Treatment) HPI Comments: 79 year old male presents with intermittent chest pain. He states it started 2 nights ago last about an hour and a half and then resolved. He started having again last night while he is sitting on the couch. He went to bed and then when he woke up he was still having a dull chest pain in his left chest. States does not radiate. He has no nausea or vomiting. He states he does have a history of a 4 cm abdominal aortic aneurysm but states he is not having any abdominal pain or weakness or numbness. States the pain is not in his back and is not sharp in intensity. He states he has not any shortness of breath and has not noticed worsening with exertion. Has not tried anything for the pain. Is currently a 3/10 in intensity. When reviewing his medications he states he's been prescribed Viagra but has not used it.   Past Medical History  Diagnosis Date  . Anxiety   . Hyperlipidemia   . Abdominal aneurysm without mention of rupture   . Insomnia, unspecified   . Colon polyps   . Arthritis   . Diverticulosis of colon (without mention of hemorrhage)   . Sleep apnea    Past Surgical History  Procedure Laterality Date  . Vasectomy    . Cholecystectomy    . Colon surgery     Family History  Problem Relation Age of Onset  . Colon cancer Neg Hx    History  Substance Use Topics  . Smoking status: Former Smoker    Start date: 10/23/1984  . Smokeless tobacco: Never Used  . Alcohol Use: 0.6 oz/week    1 Glasses of wine per week    Review of Systems  Constitutional: Negative for fever.  Respiratory: Negative for shortness of breath.   Cardiovascular: Positive for chest pain. Negative for leg swelling.   Gastrointestinal: Negative for nausea, vomiting and abdominal pain.  Musculoskeletal: Negative for back pain.  Neurological: Negative for weakness.  All other systems reviewed and are negative.    Allergies  Celebrex and Codeine  Home Medications   Current Outpatient Rx  Name  Route  Sig  Dispense  Refill  . aspirin 81 MG tablet   Oral   Take 81 mg by mouth daily.         . B Complex-C (SUPER B COMPLEX PO)   Oral   Take by mouth daily.         . Cholecalciferol (VITAMIN D) 2000 UNITS CAPS   Oral   Take by mouth daily.         . citalopram (CELEXA) 20 MG tablet   Oral   Take 1 tablet (20 mg total) by mouth daily.   90 tablet   3   . Glucosamine-Chondroit-Vit C-Mn (GLUCOSAMINE CHONDR 1500 COMPLX PO)   Oral   Take 1 capsule by mouth daily.         Marland Kitchen LORazepam (ATIVAN) 1 MG tablet      1 by mouth daily at night and 1/2 during the day "AS NEEDED"   45 tablet   0   . Melatonin 3 MG TABS   Oral   Take by mouth at bedtime.         Marland Kitchen  mometasone (NASONEX) 50 MCG/ACT nasal spray   Nasal   Place 2 sprays into the nose daily.   17 g   12   . Multiple Vitamin (MULTIVITAMIN) tablet   Oral   Take 1 tablet by mouth daily.         . niacin 100 MG tablet   Oral   Take 250 mg by mouth daily with breakfast.          . Olopatadine HCl 0.2 % SOLN   Ophthalmic   Apply 1 drop to eye daily.   2.5 mL   3   . Omega-3 Fatty Acids (FISH OIL) 1000 MG CAPS   Oral   Take 1 capsule by mouth daily.         . sildenafil (VIAGRA) 50 MG tablet      1/2 tablet to 1 tablet as needed daily for ED   15 tablet   0   . simvastatin (ZOCOR) 40 MG tablet      Take one tablet once a day at bedtime for cholesterol supplement   90 tablet   3   . vitamin E 400 UNIT capsule   Oral   Take 400 Units by mouth daily.          BP 143/80  Pulse 84  Temp(Src) 98.3 F (36.8 C) (Oral)  Resp 18  Ht 5\' 9"  (1.753 m)  Wt 195 lb (88.451 kg)  BMI 28.78 kg/m2  SpO2  96% Physical Exam  Nursing note and vitals reviewed. Constitutional: He is oriented to person, place, and time. He appears well-developed and well-nourished.  HENT:  Head: Normocephalic and atraumatic.  Right Ear: External ear normal.  Left Ear: External ear normal.  Nose: Nose normal.  Eyes: Right eye exhibits no discharge. Left eye exhibits no discharge.  Neck: Neck supple.  Cardiovascular: Normal rate, regular rhythm, normal heart sounds and intact distal pulses.   Pulmonary/Chest: Effort normal and breath sounds normal.  Abdominal: Soft. There is no tenderness.  Musculoskeletal: He exhibits no edema.  Neurological: He is alert and oriented to person, place, and time.  Skin: Skin is warm and dry.    ED Course  Procedures (including critical care time) Labs Review Labs Reviewed  CBC  BASIC METABOLIC PANEL   Imaging Review No results found.  EKG Interpretation     Ventricular Rate:  77 PR Interval:  166 QRS Duration: 110 QT Interval:  426 QTC Calculation: 482 R Axis:   -34 Text Interpretation:  Normal sinus rhythm Left axis deviation Incomplete right bundle branch block Nonspecific ST abnormality Prolonged QT Abnormal ECG No old tracing to compare            MDM   1. Atypical chest pain    His chest pain is atypical. There is no severe component to it, no back pain, and no neurologic symptoms. He does have a history of a AAA but I feel that an aortic etiology is very unlikely. CP resolved with nitro. Due to risk factors (age, hyperlipidemia) will obs for ACS r/o and risk stratification.    Audree Camel, MD 08/23/13 1321

## 2013-08-24 DIAGNOSIS — R0789 Other chest pain: Secondary | ICD-10-CM | POA: Diagnosis not present

## 2013-08-24 DIAGNOSIS — R079 Chest pain, unspecified: Secondary | ICD-10-CM | POA: Diagnosis not present

## 2013-08-24 DIAGNOSIS — I714 Abdominal aortic aneurysm, without rupture: Secondary | ICD-10-CM | POA: Diagnosis not present

## 2013-08-24 DIAGNOSIS — I369 Nonrheumatic tricuspid valve disorder, unspecified: Secondary | ICD-10-CM | POA: Diagnosis not present

## 2013-08-24 LAB — BASIC METABOLIC PANEL
CO2: 25 mEq/L (ref 19–32)
Calcium: 9.1 mg/dL (ref 8.4–10.5)
Creatinine, Ser: 1.22 mg/dL (ref 0.50–1.35)
GFR calc Af Amer: 61 mL/min — ABNORMAL LOW (ref >90)
GFR calc non Af Amer: 52 mL/min — ABNORMAL LOW (ref >90)
Potassium: 3.7 mEq/L (ref 3.5–5.1)
Sodium: 140 mEq/L (ref 135–145)

## 2013-08-24 LAB — TROPONIN I: Troponin I: <0.3 ng/mL (ref <0.30)

## 2013-08-24 LAB — CBC
Hemoglobin: 14.8 g/dL (ref 13.0–17.0)
Platelets: 161 10*3/uL (ref 150–400)
RBC: 4.57 MIL/uL (ref 4.22–5.81)

## 2013-08-24 MED ORDER — LORAZEPAM 1 MG PO TABS
1.0000 mg | ORAL_TABLET | Freq: Once | ORAL | Status: AC
  Administered 2013-08-24: 1 mg via ORAL

## 2013-08-24 MED ORDER — PANTOPRAZOLE SODIUM 40 MG PO TBEC: 40.0000 mg | DELAYED_RELEASE_TABLET | Freq: Two times a day (BID) | ORAL | Status: DC

## 2013-08-24 NOTE — Discharge Summary (Signed)
Physician Discharge Summary  Andrew Vega AOZ:[id number] DOB: 11-Jan-1926 DOA: 08/23/2013  PCP: Bufford Spikes, DO  Admit date: 08/23/2013 Discharge date: 08/24/2013  Time spent: 35 minutes  Recommendations for Outpatient Follow-up:  1. Needs to follow up with cardio for further evaluation of episode of chest pain and clearance for surgery.  2. Needs to follow up with Vascular   Discharge Diagnoses:    Chest pain    Generalized anxiety disorder   hyperlipidemia   AAA (abdominal aortic aneurysm) without rupture   Discharge Condition: Stable  Diet recommendation: Heart Healthy  Filed Weights   08/23/13 0911 08/24/13 0500  Weight: 88.451 kg (195 lb) 88.361 kg (194 lb 12.8 oz)    History of present illness:  Andrew Vega is a 79 y.o. male with PMH significant for Hyperlipidemia, AAA, who presents to the ED complaining of chest pain that started 3 days prior to admission, intermittent, last for 1 hour, is a discomfort, pressure in quality, intensity 5/10, non radiating. Not associated with exertion. He has had 2 to 3 episodes. He had chest pain last night and this morning. Is pleuritic. Not associated with dyspnea, nausea or vomiting.  There was a note (10-31) from a phone call to Dr Christella Hartigan office where Patient during that phone call relay abdominal pain also. Patient was refer to the ED for evaluation. Patient denies abdominal pain.    Hospital Course:  1-Chest pain: pleuritic; Patient was admitted to telemetry to rule out ACS. cycled cardiac enzymes times 3 negative. Patient is chest pain free. ECHO with EF 60 %, no wall motion abnormalities.  Nitroglycerin PRN. . EKG with old  incomplete RBBB.  CT chest angio negative for PE or aneurism. I will arrange outpatient follow up with cardio for further evaluation.  2-Hyperlipidemia: Continue with Statin.  3-Generalized anxiety disorder, Continue with Ativan PRN.  4-History of AAA: CT angio: No evidence of aortic dissection or other acute  findings.  5.3 cm infrarenal abdominal aortic aneurysm, and bilateral common iliac pseudo or aneurysms measuring 3.0 cm on the left and 2.4 cm on the right. Recommend follow up by abdomen and pelvis CTA in 3-6 months. I have arrange follow up with vascular. Dr Hart [place] office will call patient.    Procedures: ECHO: Left ventricle: The cavity size was normal. Wall thickness was increased in a pattern of moderate LVH. The estimated ejection fraction was 60%. Wall motion was normal; there were no regional wall motion abnormalities. Doppler parameters are consistent with abnormal left ventricular relaxation (grade 1 diastolic dysfunction). - Aortic valve: Sclerosis without stenosis. Trivial regurgitation. - Aorta: Slight dilitation aortic root. Aortic root dimension: 40mm (ED). - Mitral valve: Mildly calcified annulus. - Left atrium: The atrium was mildly dilated. - Pulmonary arteries: PA peak pressure: 35mm Hg (S). Transthoracic echocardiography. M-mode, complete 2D, spectral Doppler, and color Doppler. Height: Height: 175.3cm. Height: 69in. Weight: Weight: 88kg. Weight: 193.6lb. Body mass index: BMI: 28.6kg/m^2. Body surface area: BSA: 2.62m^2. Blood pressure: 116/56. Patient status: Inpatient. Location: Bedside.      Consultations:  none  Discharge Exam: Filed Vitals:   08/24/13 0500  BP: 116/56  Pulse: 62  Temp: 98.2 F (36.8 C)  Resp: 16    General: no distress. Cardiovascular: S 1, S 2 RRR Respiratory: CTA  Discharge Instructions   Future Appointments Provider Department Dept Phone   10/02/2013 10:30 AM Claudie Revering, NP [facility] [phone]       Medication List    ASK your doctor about these  medications       aspirin 81 MG tablet  Take 81 mg by mouth daily.     citalopram 20 MG tablet  Commonly known as:  CELEXA  Take 1 tablet (20 mg total) by mouth daily.     Fish Oil 1000 MG Caps  Take 1 capsule by mouth daily.     GLUCOSAMINE  CHONDR 1500 COMPLX PO  Take 1 capsule by mouth daily.     LORazepam 1 MG tablet  Commonly known as:  ATIVAN  1 by mouth daily at night and 1/2 during the day "AS NEEDED"     Melatonin 3 MG Tabs  Take by mouth at bedtime.     mometasone 50 MCG/ACT nasal spray  Commonly known as:  NASONEX  Place 2 sprays into the nose daily.     multivitamin tablet  Take 1 tablet by mouth daily.     niacin 100 MG tablet  Take 250 mg by mouth daily with breakfast.     Olopatadine HCl 0.2 % Soln  Apply 1 drop to eye daily.     sildenafil 50 MG tablet  Commonly known as:  VIAGRA  1/2 tablet to 1 tablet as needed daily for ED     simvastatin 40 MG tablet  Commonly known as:  ZOCOR  Take one tablet once a day at bedtime for cholesterol supplement     SUPER B COMPLEX PO  Take by mouth daily.     Vitamin D 2000 UNITS Caps  Take by mouth daily.     vitamin E 400 UNIT capsule  Take 400 Units by mouth daily.       Allergies  Allergen Reactions  . Celebrex [Celecoxib]     Raised B/P   . Codeine     Severe stomach pain        Follow-up Information   Follow up with Josephina Gip, MD. (his office will call you. )    Specialty:  Vascular Surgery   Contact information:   [address] [place] [address] [phone]       Follow up with Charlton Haws, MD. (the office will call you with an appointment. )    Specialty:  Cardiology   Contact information:   [address] [place] [address] [phone]        The results of significant diagnostics from this hospitalization (including imaging, microbiology, ancillary and laboratory) are listed below for reference.    Significant Diagnostic Studies: Dg Chest 2 View  08/23/2013   CLINICAL DATA:  Sternal chest pain. Cardiac history.  EXAM: CHEST  2 VIEW  COMPARISON:  None.  FINDINGS: Emphysema is present. The cardiopericardial silhouette is within normal limits. Tortuous thoracic aorta. Hyperinflation is noted on  the lateral view. Basilar atelectasis and chronic bronchitic changes are noted. There is no airspace disease. No pleural effusion. There is a pulmonary nodule and the scars the calcified granuloma is present in the right middle lobe, seen on both frontal and lateral views. Multilevel thoracic spondylosis.  IMPRESSION: Emphysema without acute cardiopulmonary disease. Old granulomatous disease.   Electronically Signed   By: Andreas Newport M.D.   On: 08/23/2013 10:22   Ct Angio Abdomen W/cm &/or Wo Contrast  08/23/2013   CLINICAL DATA:  Severe chest and abdominal pain. Nausea and vomiting. Known aortic aneurysm. Clinical suspicion for aortic dissection.  EXAM: CT ANGIOGRAPHY OF CHEST AND ABDOMEN  TECHNIQUE: Multidetector CT imaging of the chest and abdomen was performed  during bolus injection of intravenous contrast. Multiplanar CT angiographic image reconstructions including MIPs were also generated to evaluate the vascular anatomy.  CONTRAST:  75mL OMNIPAQUE IOHEXOL 350 MG/ML SOLN  COMPARISON:  None.  FINDINGS: CTA CHEST FINDINGS  No evidence of thoracic aortic dissection or aneurysm. No evidence of mediastinal hematoma or mass. No lymphadenopathy identified within the thorax.  No evidence of pleural or pericardial effusion. Mild bibasilar scarring is noted. No evidence of pulmonary infiltrate or central endobronchial lesion. No suspicious pulmonary nodules or masses are identified.  Review of the MIP images confirms the above findings.  CTA ABDOMEN FINDINGS  Infrarenal abdominal aortic aneurysm is seen which measures 5.3 by 4.9 cm in maximum diameter. No evidence of aneurysm leak or rupture. No evidence of abdominal aortic dissection.  Pseudoaneurysms are also seen involving the common iliac arteries bilaterally. These measure 3.0 cm on the left and 2.4 cm on the right.  The liver, spleen, pancreas, and adrenal glands are normal in appearance. Small renal cysts are noted bilaterally, however there is no evidence  of renal masses or hydronephrosis. No evidence of abdominal lymphadenopathy. Colonic diverticulosis is noted, however there is no evidence of inflammatory process or abnormal fluid collections.  Review of the MIP images confirms the above findings.  IMPRESSION: No evidence of aortic dissection or other acute findings.  5.3 cm infrarenal abdominal aortic aneurysm, and bilateral common iliac pseudo or aneurysms measuring 3.0 cm on the left and 2.4 cm on the right. Recommend follow up by abdomen and pelvis CTA in 3-6 months, and consider vascular surgery referral/consultation if not already obtained. This recommendation follows ACR consensus guidelines: White Paper of the ACR Incidental Findings Committee II on Vascular Findings. J Am Coll Radiol 2013; 10:789-794.   Electronically Signed   By: Myles Rosenthal M.D.   On: 08/23/2013 15:53   Ct Angio Chest Aortic Dissect W &/or W/o  08/23/2013   CLINICAL DATA:  Severe chest and abdominal pain. Nausea and vomiting. Known aortic aneurysm. Clinical suspicion for aortic dissection.  EXAM: CT ANGIOGRAPHY OF CHEST AND ABDOMEN  TECHNIQUE: Multidetector CT imaging of the chest and abdomen was performed during bolus injection of intravenous contrast. Multiplanar CT angiographic image reconstructions including MIPs were also generated to evaluate the vascular anatomy.  CONTRAST:  75mL OMNIPAQUE IOHEXOL 350 MG/ML SOLN  COMPARISON:  None.  FINDINGS: CTA CHEST FINDINGS  No evidence of thoracic aortic dissection or aneurysm. No evidence of mediastinal hematoma or mass. No lymphadenopathy identified within the thorax.  No evidence of pleural or pericardial effusion. Mild bibasilar scarring is noted. No evidence of pulmonary infiltrate or central endobronchial lesion. No suspicious pulmonary nodules or masses are identified.  Review of the MIP images confirms the above findings.  CTA ABDOMEN FINDINGS  Infrarenal abdominal aortic aneurysm is seen which measures 5.3 by 4.9 cm in maximum  diameter. No evidence of aneurysm leak or rupture. No evidence of abdominal aortic dissection.  Pseudoaneurysms are also seen involving the common iliac arteries bilaterally. These measure 3.0 cm on the left and 2.4 cm on the right.  The liver, spleen, pancreas, and adrenal glands are normal in appearance. Small renal cysts are noted bilaterally, however there is no evidence of renal masses or hydronephrosis. No evidence of abdominal lymphadenopathy. Colonic diverticulosis is noted, however there is no evidence of inflammatory process or abnormal fluid collections.  Review of the MIP images confirms the above findings.  IMPRESSION: No evidence of aortic dissection or other acute findings.  5.3 cm  infrarenal abdominal aortic aneurysm, and bilateral common iliac pseudo or aneurysms measuring 3.0 cm on the left and 2.4 cm on the right. Recommend follow up by abdomen and pelvis CTA in 3-6 months, and consider vascular surgery referral/consultation if not already obtained. This recommendation follows ACR consensus guidelines: White Paper of the ACR Incidental Findings Committee II on Vascular Findings. J Am Coll Radiol 2013; 10:789-794.   Electronically Signed   By: Myles Rosenthal M.D.   On: 08/23/2013 15:53    Microbiology: No results found for this or any previous visit (from the past 240 hour(s)).   Labs: Basic Metabolic Panel:  Recent Labs Lab 08/23/13 0924 08/24/13 0810  NA 139 140  K 3.9 3.7  CL 103 106  CO2 26 25  GLUCOSE 94 140*  BUN 17 16  CREATININE 1.21 1.22  CALCIUM 9.2 9.1   Liver Function Tests: No results found for this basename: AST, ALT, ALKPHOS, BILITOT, PROT, ALBUMIN,  in the last 168 hours No results found for this basename: LIPASE, AMYLASE,  in the last 168 hours No results found for this basename: AMMONIA,  in the last 168 hours CBC:  Recent Labs Lab 08/23/13 0924 08/24/13 0810  WBC 7.5 6.8  HGB 14.5 14.8  HCT 41.1 41.0  MCV 90.9 89.7  PLT 172 161   Cardiac  Enzymes:  Recent Labs Lab 08/23/13 0020 08/23/13 1205 08/23/13 1810  TROPONINI <0.30 <0.30 <0.30   BNP: BNP (last 3 results) No results found for this basename: PROBNP,  in the last 8760 hours CBG: No results found for this basename: GLUCAP,  in the last 168 hours     Signed:  Santresa Levett  [facility] 08/24/2013, 12:47 PM

## 2013-08-24 NOTE — Progress Notes (Signed)
  Echocardiogram 2D Echocardiogram has been performed.  Andrew Vega 08/24/2013, 9:57 AM

## 2013-08-24 NOTE — Progress Notes (Signed)
Utilization Review Completed.Andrew Vega T11/11/2012  

## 2013-08-24 NOTE — Progress Notes (Signed)
Dr. Sunnie Nielsen requests outpt f/u to determine what further cardiac testing is necessary (ie stress). I have left a message on our office's scheduling voicemail requesting a follow-up appointment, and our office will call the patient with this appointment. Symphany Fleissner PA-C

## 2013-08-24 NOTE — Progress Notes (Signed)
Pt provided with dc instructions and education. Pt verbalized understanding. Pt aware of new medications and hwo to take them. No questions at this time. Wallet returned to patient from security. IV removed with tip itnact. Heart monitor cleaned and returned to front. Levonne Spiller, RN

## 2013-08-25 ENCOUNTER — Telehealth: Payer: Self-pay | Admitting: Cardiovascular Disease

## 2013-08-25 NOTE — Telephone Encounter (Signed)
Please review PA  [facility] schedule this week, two app available.

## 2013-08-25 NOTE — Telephone Encounter (Signed)
New Problem  Per after Hours pt needs an appt this week// No appts available please advise//SR

## 2013-08-25 NOTE — Telephone Encounter (Signed)
Pt aware and has appt with vascular surgeon

## 2013-08-25 NOTE — Telephone Encounter (Signed)
Left message on machine to call back  

## 2013-08-28 DIAGNOSIS — B351 Tinea unguium: Secondary | ICD-10-CM | POA: Diagnosis not present

## 2013-08-28 DIAGNOSIS — M79609 Pain in unspecified limb: Secondary | ICD-10-CM | POA: Diagnosis not present

## 2013-09-01 ENCOUNTER — Other Ambulatory Visit: Payer: Self-pay | Admitting: *Deleted

## 2013-09-01 DIAGNOSIS — F411 Generalized anxiety disorder: Secondary | ICD-10-CM

## 2013-09-01 MED ORDER — LORAZEPAM 1 MG PO TABS: ORAL_TABLET | ORAL | Status: DC

## 2013-09-03 ENCOUNTER — Encounter: Payer: Self-pay | Admitting: Internal Medicine

## 2013-09-03 ENCOUNTER — Ambulatory Visit (INDEPENDENT_AMBULATORY_CARE_PROVIDER_SITE_OTHER): Payer: Medicare Other | Admitting: Internal Medicine

## 2013-09-03 VITALS — BP 138/82 | HR 80 | Ht 69.0 in | Wt 194.7 lb

## 2013-09-03 DIAGNOSIS — E785 Hyperlipidemia, unspecified: Secondary | ICD-10-CM | POA: Insufficient documentation

## 2013-09-03 DIAGNOSIS — I714 Abdominal aortic aneurysm, without rupture: Secondary | ICD-10-CM | POA: Diagnosis not present

## 2013-09-03 DIAGNOSIS — R079 Chest pain, unspecified: Secondary | ICD-10-CM

## 2013-09-03 NOTE — Progress Notes (Signed)
OFFICE NOTE  Chief Complaint:  Chest pain, hospital follow-up  Primary Care Physician: Bufford Spikes, DO  HPI:  Andrew Vega is a pleasant 79 year old male who is originally from [place]. He is wife are living in [place] and recently he moved up here as she developed Alzheimer's. She is unfortunately required full-time care and he has been doing fairly well except for recently when he developed chest pain in the left upper chest. He reported being a dull ache it did not go away after several hours he elected to come to the emergency room. He was admitted and ruled out for MI. He had a CT pulmonary angiogram which was negative for PE. Unfortunately the gating of the contrast filled the coronaries so it was difficult to determine whether he has coronary calcium or not. As mentioned he did not have a PE. He also underwent serial blood tests which were negative for troponin an echocardiogram which demonstrated an EF of 60% with normal wall motion, aortic sclerosis and mild aortic insufficiency.  He also has a known AAA and recently had an abdominal ultrasound showed dilated increased up to 4.9 cm. A CT scan performed on hospital showed at one section it was up to 5.3 cm. He was seen by vascular surgery and has a followup appointment with Dr. Hart [place] in December.  He has no cardiac history that is known to was previously seen by cardiologist named Dr. Adelfa Koh in [facility].  He thinks he had a stress test within the last 2 years.  He also has dyslipidemia on a statin and no significant history of hypertension.  He was a former smoker but quit in 1986. In addition there is heart disease in his father who had heart attack age 41 and died.  PMHx:  Past Medical History  Diagnosis Date  . Anxiety   . Hyperlipidemia   . Abdominal aneurysm without mention of rupture   . Insomnia, unspecified   . Colon polyps   . Arthritis   . Diverticulosis of colon (without mention of hemorrhage)   . COPD  (chronic obstructive pulmonary disease)   . Sleep apnea   . Inguinal hernia   . Cancer     skin cancer removal    Past Surgical History  Procedure Laterality Date  . Vasectomy    . Cholecystectomy    . Colon surgery    . Bilateral shoulder repair      FAMHx:  Family History  Problem Relation Age of Onset  . Colon cancer Neg Hx   . Heart attack Mother   . Heart attack Father     SOCHx:   reports that he quit smoking about 28 years ago. He has never used smokeless tobacco. He reports that he drinks about 0.6 ounces of alcohol per week. He reports that he does not use illicit drugs.  ALLERGIES:  Allergies  Allergen Reactions  . Celebrex [Celecoxib]     Raised B/P   . Codeine     Severe stomach pain     ROS: A comprehensive review of systems was negative except for: Cardiovascular: positive for chest pain  HOME MEDS: Current Outpatient Prescriptions  Medication Sig Dispense Refill  . aspirin 81 MG tablet Take 81 mg by mouth daily.      . B Complex-C (SUPER B COMPLEX PO) Take by mouth daily.      . Cholecalciferol (VITAMIN D) 2000 UNITS CAPS Take by mouth daily.      Marland Kitchen  citalopram (CELEXA) 20 MG tablet Take 1 tablet (20 mg total) by mouth daily.  90 tablet  3  . Glucosamine-Chondroit-Vit C-Mn (GLUCOSAMINE CHONDR 1500 COMPLX PO) Take 1 capsule by mouth daily.      Marland Kitchen LORazepam (ATIVAN) 1 MG tablet 1 by mouth daily at night and 1/2 during the day "AS NEEDED"  45 tablet  0  . Melatonin 3 MG TABS Take by mouth at bedtime.      . mometasone (NASONEX) 50 MCG/ACT nasal spray Place 2 sprays into the nose daily.  17 g  12  . Multiple Vitamin (MULTIVITAMIN) tablet Take 1 tablet by mouth daily.      . niacin 100 MG tablet Take 250 mg by mouth daily with breakfast.       . Olopatadine HCl 0.2 % SOLN Apply 1 drop to eye daily.  2.5 mL  3  . Omega-3 Fatty Acids (FISH OIL) 1000 MG CAPS Take 1 capsule by mouth daily.      . pantoprazole (PROTONIX) 40 MG tablet Take 1 tablet (40 mg  total) by mouth 2 (two) times daily.  30 tablet  0  . simvastatin (ZOCOR) 40 MG tablet Take one tablet once a day at bedtime for cholesterol supplement  90 tablet  3  . vitamin E 400 UNIT capsule Take 400 Units by mouth daily.       No current facility-administered medications for this visit.    LABS/IMAGING: No results found for this or any previous visit (from the past 48 hour(s)). No results found.  VITALS: BP 138/82  Pulse 80  Ht 5\' 9"  (1.753 m)  Wt 194 lb 11.2 oz (88.315 kg)  BMI 28.74 kg/m2  EXAM: General appearance: alert and no distress Neck: no carotid bruit and no JVD Lungs: clear to auscultation bilaterally Heart: regular rate and rhythm, S1, S2 normal and diastolic murmur: mid diastolic 2/6, crescendo at 2nd left intercostal space Abdomen: soft, non-tender; bowel sounds normal; no masses,  no organomegaly and no pulsatile mass appreciated Extremities: extremities normal, atraumatic, no cyanosis or edema Pulses: 2+ and symmetric Skin: Skin color, texture, turgor normal. No rashes or lesions Neurologic: Grossly normal Psych: Mood, affect normal  EKG: Normal sinus rhythm at 80  ASSESSMENT: 1. Chest pain, with cardiac risk factors 2. AAA with recent enlargement 3. Dyslipidemia 4. Family history premature coronary disease  PLAN: 1.   Mr. Ballweg had a recent episode of chest pain which sounded like it could be cardiac in nature. He does have risk factors for coronary disease and may have had a stress test within the last couple of years. He has had enlargement of his abdominal aortic aneurysm and it is getting close to repair. It would be reasonable to consider retesting for ischemia, at least as a preoperative evaluation if he were possibly to undergo abdominal aneurysm surgery in the near future. We'll go ahead and schedule a lax skin of her stress test and obtain results from his cardiologist and primary care provider.  Thank you again for the kind  referral.  Chrystie Nose, MD, [facility] Attending Cardiologist [facility]  Ociel Retherford C 09/03/2013, 6:21 PM

## 2013-09-03 NOTE — Patient Instructions (Signed)
Your physician has requested that you have a lexiscan myoview. For further information please visit [email or web address] Please follow instruction sheet, as given.  Your physician has requested that you have an abdominal aorta duplex. During this test, an ultrasound is used to evaluate the aorta. Allow 30 minutes for this exam. Do not eat after midnight the day before and avoid carbonated beverages  We will call you with the results.   Your physician wants you to follow-up in: 6 months. You will receive a reminder letter in the mail two months in advance. If you don't receive a letter, please call our office to schedule the follow-up appointment.

## 2013-09-04 ENCOUNTER — Encounter: Payer: Self-pay | Admitting: Internal Medicine

## 2013-09-04 ENCOUNTER — Other Ambulatory Visit: Payer: Self-pay | Admitting: *Deleted

## 2013-09-04 DIAGNOSIS — F411 Generalized anxiety disorder: Secondary | ICD-10-CM

## 2013-09-04 DIAGNOSIS — F4322 Adjustment disorder with anxiety: Secondary | ICD-10-CM | POA: Diagnosis not present

## 2013-09-04 MED ORDER — LORAZEPAM 1 MG PO TABS: ORAL_TABLET | ORAL | Status: DC

## 2013-09-05 ENCOUNTER — Telehealth: Payer: Self-pay | Admitting: Internal Medicine

## 2013-09-05 NOTE — Telephone Encounter (Signed)
Has a question regarding the anuersyn he has   Is it on ascending or descending  Please call

## 2013-09-05 NOTE — Telephone Encounter (Signed)
Returned patient's call. Patient stated has been doing research of type of aneurysm and wanted to know if ascending or descending. Informed patient that per records, he has an abdominal aortic aneurysm. Patient verbalized understanding.

## 2013-09-10 ENCOUNTER — Ambulatory Visit (HOSPITAL_COMMUNITY)
Admission: RE | Admit: 2013-09-10 | Discharge: 2013-09-10 | Disposition: A | Discharge status: Home / Self Care | Payer: Medicare Other | Source: Ambulatory Visit | Attending: Internal Medicine | Admitting: Internal Medicine

## 2013-09-10 DIAGNOSIS — I714 Abdominal aortic aneurysm, without rupture, unspecified: Secondary | ICD-10-CM | POA: Insufficient documentation

## 2013-09-10 DIAGNOSIS — F4322 Adjustment disorder with anxiety: Secondary | ICD-10-CM | POA: Diagnosis not present

## 2013-09-10 NOTE — Progress Notes (Signed)
Abdominal Aortic Duplex Completed °Brianna L Mazza,RVT °

## 2013-09-12 ENCOUNTER — Ambulatory Visit (HOSPITAL_COMMUNITY)
Admission: RE | Admit: 2013-09-12 | Discharge: 2013-09-12 | Disposition: A | Discharge status: Home / Self Care | Payer: Medicare Other | Source: Ambulatory Visit | Attending: Internal Medicine | Admitting: Internal Medicine

## 2013-09-12 DIAGNOSIS — R079 Chest pain, unspecified: Secondary | ICD-10-CM | POA: Diagnosis not present

## 2013-09-12 MED ORDER — TECHNETIUM TC 99M SESTAMIBI GENERIC - CARDIOLITE
32.0000 | Freq: Once | INTRAVENOUS | Status: AC | PRN
  Administered 2013-09-12: 32 via INTRAVENOUS

## 2013-09-12 MED ORDER — AMINOPHYLLINE 25 MG/ML IV SOLN
75.0000 mg | Freq: Once | INTRAVENOUS | Status: AC
  Administered 2013-09-12: 75 mg via INTRAVENOUS

## 2013-09-12 MED ORDER — TECHNETIUM TC 99M SESTAMIBI GENERIC - CARDIOLITE
10.3000 | Freq: Once | INTRAVENOUS | Status: AC | PRN
  Administered 2013-09-12: 10 via INTRAVENOUS

## 2013-09-12 MED ORDER — REGADENOSON 0.4 MG/5ML IV SOLN
0.4000 mg | Freq: Once | INTRAVENOUS | Status: AC
  Administered 2013-09-12: 0.4 mg via INTRAVENOUS

## 2013-09-12 NOTE — Procedures (Addendum)
[place] [facility] [address] [place] [address] [phone]  Cardiology Nuclear Med Study  Andrew Vega is a 79 y.o. male     MRN : [id number]     DOB: 1926-06-22  Procedure Date: 09/12/2013  Nuclear Med Background Indication for Stress Test:  Evaluation for Ischemia, Post Hospital and Abnormal EKG History:  COPD and DVT;AAA; Cardiac Risk Factors: Family History - CAD, History of Smoking, Lipids, Overweight, PVD and RBBB  Symptoms:  Chest Pain and Fatigue   Nuclear Pre-Procedure Caffeine/Decaff Intake:  12:00am NPO After: 10AM   IV Site: R Forearm  IV 0.9% NS with Angio Cath:  22g  Chest Size (in):  42"  IV Started by: Emmit Pomfret, RN  Height: 5\' 9"  (1.753 m)  Cup Size: n/a  BMI:  Body mass index is 28.64 kg/(m^2). Weight:  194 lb (87.998 kg)   Tech Comments:  N/A    Nuclear Med Study 1 or 2 day study: 1 day  Stress Test Type:  Lexiscan  Order Authorizing Provider: Zoila Shutter, MD   Resting Radionuclide: Technetium 53m Sestamibi  Resting Radionuclide Dose: 10.3 mCi   Stress Radionuclide:  Technetium 50m Sestamibi  Stress Radionuclide Dose: 32.0 mCi           Stress Protocol Rest HR: 73 Stress HR: 90  Rest BP:138/86 Stress BP: 143/85  Exercise Time (min): n/a METS: n/a          Dose of Adenosine (mg):  n/a Dose of Lexiscan: 0.4 mg  Dose of Atropine (mg): n/a Dose of Dobutamine: n/a mcg/kg/min (at max HR)  Stress Test Technologist: Ernestene Mention, CCT Nuclear Technologist: Gonzella Lex, CNMT   Rest Procedure:  Myocardial perfusion imaging was performed at rest 45 minutes following the intravenous administration of Technetium 46m Sestamibi. Stress Procedure:  The patient received IV Lexiscan 0.4 mg over 15-seconds.  Technetium 26m Sestamibi injected at 30-seconds.  Due to patient's shortness of breath and light-headedness, he was given IV Aminophylline 75 mg. Symptoms were resolved during recovery. There  were no significant changes with Lexiscan.  Quantitative spect images were obtained after a 45 minute delay.  Transient Ischemic Dilatation (Normal <1.22):  1.07 Lung/Heart Ratio (Normal <0.45):  0.25 QGS EDV:  89 ml QGS ESV:  37 ml LV Ejection Fraction: 59%  Rest ECG: NSR - Normal EKG  Stress ECG: No significant change from baseline ECG  QPS Raw Data Images:  There is interference from nuclear activity from structures below the diaphragm. This does not affect the ability to read the study. Stress Images:  There is decreased uptake in the inferior wall. Rest Images:  There is decreased uptake in the inferior wall. Subtraction (SDS):  There is a fixed inferior defect that is most consistent with diaphragmatic attenuation.  Impression Exercise Capacity:  Lexiscan with no exercise. BP Response:  Normal blood pressure response. Clinical Symptoms:  No significant symptoms noted. ECG Impression:  No significant ECG changes with Lexiscan. Comparison with Prior Nuclear Study: No previous nuclear study performed  Overall Impression:  Low risk stress nuclear study inferior bowel attenuation artifact. No ischemia.  LV Wall Motion:  NL LV Function; NL Wall Motion; EF 59%.  Chrystie Nose, MD, [facility] Board Certified in Nuclear Cardiology Attending Cardiologist [facility]  Chrystie Nose, MD  09/12/2013 5:00 PM

## 2013-09-16 ENCOUNTER — Encounter: Payer: Self-pay | Admitting: Internal Medicine

## 2013-09-17 ENCOUNTER — Encounter: Payer: Self-pay | Admitting: Vascular Surgery

## 2013-09-22 ENCOUNTER — Encounter: Payer: Self-pay | Admitting: Vascular Surgery

## 2013-09-22 DIAGNOSIS — F4322 Adjustment disorder with anxiety: Secondary | ICD-10-CM | POA: Diagnosis not present

## 2013-09-23 ENCOUNTER — Ambulatory Visit (INDEPENDENT_AMBULATORY_CARE_PROVIDER_SITE_OTHER): Payer: Medicare Other | Admitting: Vascular Surgery

## 2013-09-23 ENCOUNTER — Other Ambulatory Visit: Payer: Self-pay | Admitting: *Deleted

## 2013-09-23 ENCOUNTER — Encounter: Payer: Self-pay | Admitting: Vascular Surgery

## 2013-09-23 VITALS — BP 125/73 | HR 79 | Resp 16 | Ht 70.0 in | Wt 195.0 lb

## 2013-09-23 DIAGNOSIS — I714 Abdominal aortic aneurysm, without rupture: Secondary | ICD-10-CM

## 2013-09-23 NOTE — Addendum Note (Signed)
Addended by: Adria Dill L on: 09/23/2013 10:31 AM   Modules accepted: Orders

## 2013-09-23 NOTE — Progress Notes (Signed)
Subjective:     Patient ID: Andrew Vega, male   DOB: [birth year], 79 y.o.   MRN: [id number]  HPI this 79 year old pleasant gentleman is evaluated for abdominal aortic aneurysm. He was admitted at 08/23/2013 with severe chest pain and a CT scan of the chest and abdomen that ruled out aortic dissection. He did have a 5.3 cm abdominal aortic aneurysm which extends into the left common iliac artery. I have reviewed the CT scan which did not complete evaluation distal to the mid common iliac arteries. Patient has had a known aortic aneurysm measuring 4.5 cm in the past. He moved from [place] and has not had this evaluated recently in [place] until this hospitalization. He was seen by Dr. Rennis Golden who performed a nuclear medicine stress test which was a low risk study from a cardiac standpoint. Has no history of coronary artery disease.  Past Medical History  Diagnosis Date  . Anxiety   . Hyperlipidemia   . Abdominal aneurysm without mention of rupture   . Insomnia, unspecified   . Colon polyps   . Arthritis   . Diverticulosis of colon (without mention of hemorrhage)   . COPD (chronic obstructive pulmonary disease)   . Sleep apnea   . Inguinal hernia   . Cancer     skin cancer removal    History  Substance Use Topics  . Smoking status: Former Smoker    Quit date: 10/23/1984  . Smokeless tobacco: Never Used  . Alcohol Use: 0.6 oz/week    1 Glasses of wine per week    Family History  Problem Relation Age of Onset  . Colon cancer Neg Hx   . Heart attack Mother   . Heart attack Father     Allergies  Allergen Reactions  . Celebrex [Celecoxib]     Raised B/P   . Codeine     Severe stomach pain     Current outpatient prescriptions:aspirin 81 MG tablet, Take 81 mg by mouth daily., Disp: , Rfl: ;  B Complex-C (SUPER B COMPLEX PO), Take by mouth daily., Disp: , Rfl: ;  Cholecalciferol (VITAMIN D) 2000 UNITS CAPS, Take by mouth daily., Disp: , Rfl: ;  citalopram (CELEXA) 20 MG tablet,  Take 1 tablet (20 mg total) by mouth daily., Disp: 90 tablet, Rfl: 3 Glucosamine-Chondroit-Vit C-Mn (GLUCOSAMINE CHONDR 1500 COMPLX PO), Take 1 capsule by mouth daily., Disp: , Rfl: ;  LORazepam (ATIVAN) 1 MG tablet, Take 1 tablet by mouth daily at night and 1/2 tablet during the day "AS NEEDED", Disp: 45 tablet, Rfl: 0;  Melatonin 3 MG TABS, Take by mouth at bedtime., Disp: , Rfl: ;  mometasone (NASONEX) 50 MCG/ACT nasal spray, Place 2 sprays into the nose daily., Disp: 17 g, Rfl: 12 Multiple Vitamin (MULTIVITAMIN) tablet, Take 1 tablet by mouth daily., Disp: , Rfl: ;  niacin 100 MG tablet, Take 250 mg by mouth daily with breakfast. , Disp: , Rfl: ;  Omega-3 Fatty Acids (FISH OIL) 1000 MG CAPS, Take 1 capsule by mouth daily., Disp: , Rfl: ;  simvastatin (ZOCOR) 40 MG tablet, Take one tablet once a day at bedtime for cholesterol supplement, Disp: 90 tablet, Rfl: 3 vitamin E 400 UNIT capsule, Take 400 Units by mouth daily., Disp: , Rfl: ;  Olopatadine HCl 0.2 % SOLN, Apply 1 drop to eye daily., Disp: 2.5 mL, Rfl: 3;  pantoprazole (PROTONIX) 40 MG tablet, Take 1 tablet (40 mg total) by mouth 2 (two) times daily., Disp: 30 tablet, Rfl: 0  BP 125/73  Pulse 79  Resp 16  Ht 5\' 10"  (1.778 m)  Wt 195 lb (88.451 kg)  BMI 27.98 kg/m2  Body mass index is 27.98 kg/(m^2).          Review of Systems denies active chest pain although did have a history of chest pain as noted in history of present illness. Has productive cough on occasion. Denies dyspnea on exertion, PND, orthopnea, hemoptysis, claudication. Does have a history of sleep apnea. All other systems negative complete review of systems    Objective:   Physical Exam BP 125/73  Pulse 79  Resp 16  Ht 5\' 10"  (1.778 m)  Wt 195 lb (88.451 kg)  BMI 27.98 kg/m2  Gen.-alert and oriented x3 in no apparent distress HEENT normal for age Lungs no rhonchi or wheezing Cardiovascular regular rhythm no murmurs carotid pulses 3+ palpable no bruits  audible Abdomen soft nontender -5 cm pulsatile mass in the midepigastrium Musculoskeletal free of  major deformities Skin clear -no rashes Neurologic normal Lower extremities 3+ femoral and posterior tibial pulses palpable bilaterally with no edema  As noted above I reviewed the CT angiogram performed 08/23/2013. He does appear to be a stent graft candidate although we need more information regarding his iliac artery aneurysm on the left and where it terminates in relation to the hypogastrics       Assessment:     5.3 cm infrarenal abdominal aortic aneurysm with left common iliac artery aneurysm No evidence of coronary artery disease by nuclear medicine stress test    Plan:     Will repeat CT angiogram of abdomen and pelvis to get more complete look at proximal neck and iliac systems down to the femoral level Patient will return in 2 weeks for further discussion Have tentatively scheduled aortic stent grafting on Wednesday, January 7

## 2013-09-29 DIAGNOSIS — F4322 Adjustment disorder with anxiety: Secondary | ICD-10-CM | POA: Diagnosis not present

## 2013-10-02 ENCOUNTER — Ambulatory Visit (INDEPENDENT_AMBULATORY_CARE_PROVIDER_SITE_OTHER): Payer: Medicare Other | Admitting: Nurse Practitioner

## 2013-10-02 ENCOUNTER — Encounter: Payer: Self-pay | Admitting: Nurse Practitioner

## 2013-10-02 VITALS — BP 130/74 | HR 83 | Resp 12 | Wt 199.0 lb

## 2013-10-02 DIAGNOSIS — G4700 Insomnia, unspecified: Secondary | ICD-10-CM

## 2013-10-02 DIAGNOSIS — I714 Abdominal aortic aneurysm, without rupture, unspecified: Secondary | ICD-10-CM

## 2013-10-02 DIAGNOSIS — F411 Generalized anxiety disorder: Secondary | ICD-10-CM

## 2013-10-02 DIAGNOSIS — E785 Hyperlipidemia, unspecified: Secondary | ICD-10-CM

## 2013-10-02 MED ORDER — CITALOPRAM HYDROBROMIDE 20 MG PO TABS: 20.0000 mg | ORAL_TABLET | Freq: Every day | ORAL | Status: DC

## 2013-10-02 NOTE — Progress Notes (Signed)
Patient ID: Andrew Vega, male   DOB: 1926/03/31, 79 y.o.   MRN: [id number]    Allergies  Allergen Reactions  . Celebrex [Celecoxib]     Raised B/P   . Codeine     Severe stomach pain     Chief Complaint  Patient presents with  . Medical Managment of Chronic Issues    3 month follow-up     HPI: Patient is a 79 y.o. male seen in the office today for routine follow Went to ed for chest pain; Had stress test and this was good Vascular following him for AAA- now is a candidate for surgery and will have this January 7th via stent Reports he has been mental preparing for this for some years  Has not been taking celexa (had it filled but does not know what happened after that) Does not need Rx eye drop  Questions why he has been diagnosis with Generalized Anxiety Disorder; did research online and reports he does not match this criteria, has stress and anxiety but not a disorder  Also questions other diagnosis and wondering what they mean.   Pt has not been taking protonix; does not have acid reflux or chest pain Review of Systems:  Review of Systems  Constitutional: Negative for fever, chills, malaise/fatigue and diaphoresis.  Respiratory: Negative for cough and shortness of breath.   Cardiovascular: Negative for chest pain and leg swelling.  Gastrointestinal: Negative for heartburn, diarrhea and constipation.  Genitourinary: Negative for dysuria.  Musculoskeletal: Negative for myalgias.  Skin: Negative for itching and rash.  Neurological: Negative for weakness.  Psychiatric/Behavioral: Negative for depression. The patient is nervous/anxious and has insomnia.      Past Medical History  Diagnosis Date  . Anxiety   . Hyperlipidemia   . Abdominal aneurysm without mention of rupture   . Insomnia, unspecified   . Colon polyps   . Arthritis   . Diverticulosis of colon (without mention of hemorrhage)   . COPD (chronic obstructive pulmonary disease)   . Sleep apnea   .  Inguinal hernia   . Cancer     skin cancer removal   Past Surgical History  Procedure Laterality Date  . Vasectomy    . Cholecystectomy    . Colon surgery    . Bilateral shoulder repair     Social History:   reports that he quit smoking about 28 years ago. He has never used smokeless tobacco. He reports that he drinks about 0.6 ounces of alcohol per week. He reports that he does not use illicit drugs.  Family History  Problem Relation Age of Onset  . Colon cancer Neg Hx   . Heart attack Father 41    Medications: Patient's Medications  New Prescriptions   No medications on file  Previous Medications   ASPIRIN 81 MG TABLET    Take 81 mg by mouth daily.   B COMPLEX-C (SUPER B COMPLEX PO)    Take by mouth daily.   CHOLECALCIFEROL (VITAMIN D) 2000 UNITS CAPS    Take by mouth daily.   CITALOPRAM (CELEXA) 20 MG TABLET    Take 1 tablet (20 mg total) by mouth daily.   GLUCOSAMINE-CHONDROIT-VIT C-MN (GLUCOSAMINE CHONDR 1500 COMPLX PO)    Take 1 capsule by mouth daily.   LORAZEPAM (ATIVAN) 1 MG TABLET    Take 1 tablet by mouth daily at night and 1/2 tablet during the day "AS NEEDED"   MELATONIN 3 MG TABS    Take by mouth at  bedtime.   MOMETASONE (NASONEX) 50 MCG/ACT NASAL SPRAY    Place 2 sprays into the nose daily.   MULTIPLE VITAMIN (MULTIVITAMIN) TABLET    Take 1 tablet by mouth daily.   NIACIN 100 MG TABLET    Take 250 mg by mouth daily with breakfast.    OLOPATADINE HCL 0.2 % SOLN    Apply 1 drop to eye daily.   OMEGA-3 FATTY ACIDS (FISH OIL) 1000 MG CAPS    Take 1 capsule by mouth daily.   PANTOPRAZOLE (PROTONIX) 40 MG TABLET    Take 1 tablet (40 mg total) by mouth 2 (two) times daily.   SIMVASTATIN (ZOCOR) 40 MG TABLET    Take one tablet once a day at bedtime for cholesterol supplement   VITAMIN E 400 UNIT CAPSULE    Take 400 Units by mouth daily.  Modified Medications   No medications on file  Discontinued Medications   No medications on file     Physical Exam:  Filed  Vitals:   10/02/13 1046  BP: 130/74  Pulse: 83  Resp: 12  Weight: 199 lb (90.266 kg)  SpO2: 95%    Physical Exam  Constitutional: He is oriented to person, place, and time and well-developed, well-nourished, and in no distress. No distress.  HENT:  Head: Normocephalic and atraumatic.  Eyes: Conjunctivae and EOM are normal. Pupils are equal, round, and reactive to light.  Neck: Normal range of motion. Neck supple.  Cardiovascular: Normal rate, regular rhythm and normal heart sounds.   Pulmonary/Chest: Effort normal and breath sounds normal. No respiratory distress.  Abdominal: Soft. Bowel sounds are normal.  Musculoskeletal: He exhibits no edema and no tenderness.  Neurological: He is alert and oriented to person, place, and time.  Skin: Skin is warm and dry. He is not diaphoretic.  Psychiatric: Affect normal.     Labs reviewed: Basic Metabolic Panel:  Recent Labs  40/98/11 0916 08/23/13 0924 08/24/13 0810  NA 143 139 140  K 4.2 3.9 3.7  CL 104 103 106  CO2 26 26 25   GLUCOSE 101* 94 140*  BUN 17 17 16   CREATININE 1.40* 1.21 1.22  CALCIUM 9.3 9.2 9.1  TSH 1.680  --   --    Liver Function Tests:  Recent Labs  04/11/13 0916  AST 24  ALT 21  ALKPHOS 73  BILITOT 0.5  PROT 7.1   No results found for this basename: LIPASE, AMYLASE,  in the last 8760 hours No results found for this basename: AMMONIA,  in the last 8760 hours CBC:  Recent Labs  04/11/13 0916 08/23/13 0924 08/24/13 0810  WBC 7.6 7.5 6.8  NEUTROABS 4.5  --   --   HGB 14.5 14.5 14.8  HCT 40.8 41.1 41.0  MCV 90 90.9 89.7  PLT  --  172 161   Lipid Panel:  Recent Labs  04/11/13 0916  HDL 50  LDLCALC 86  TRIG 121  CHOLHDL 3.2   TSH:  Recent Labs  04/11/13 0916  TSH 1.680    Assessment/Plan 1. AAA (abdominal aortic aneurysm) without rupture -conts to follow up with vascular, scheduled for surgery 10/29/13  2. Other and unspecified hyperlipidemia -cholesterol is well controlled  on current medications, last lipid panel in June 2014 - tolerating zocor without side effect  3. Early awakening -remains unchanged has not started exercising   4. Anxiety state, unspecified -reports this is stable; able to handle stressors better -no side effects from medications - citalopram (CELEXA) 20  MG tablet; Take 1 tablet (20 mg total) by mouth daily.  Dispense: 90 tablet; Refill: 3  45 mins Time TOTAL:  time greater than 50% of total time spent doing pt counseled and coordination of care regarding chronic conditions, diagnoses

## 2013-10-02 NOTE — Patient Instructions (Signed)
Claritin OTC for allergies (genertic brand)  To follow up with Dr Renato Gails after surgery

## 2013-10-03 ENCOUNTER — Other Ambulatory Visit: Payer: Self-pay | Admitting: Vascular Surgery

## 2013-10-03 LAB — BUN: BUN: 19 mg/dL (ref 6–23)

## 2013-10-06 ENCOUNTER — Encounter: Payer: Self-pay | Admitting: Vascular Surgery

## 2013-10-07 ENCOUNTER — Ambulatory Visit
Admission: RE | Admit: 2013-10-07 | Discharge: 2013-10-07 | Disposition: A | Discharge status: Home / Self Care | Payer: Medicare Other | Source: Ambulatory Visit | Attending: Vascular Surgery | Admitting: Vascular Surgery

## 2013-10-07 ENCOUNTER — Encounter: Payer: Self-pay | Admitting: Vascular Surgery

## 2013-10-07 ENCOUNTER — Other Ambulatory Visit: Payer: Medicare Other

## 2013-10-07 ENCOUNTER — Ambulatory Visit (INDEPENDENT_AMBULATORY_CARE_PROVIDER_SITE_OTHER): Payer: Medicare Other | Admitting: Vascular Surgery

## 2013-10-07 ENCOUNTER — Encounter: Payer: Self-pay | Admitting: *Deleted

## 2013-10-07 VITALS — BP 133/82 | HR 72 | Resp 18 | Ht 70.0 in | Wt 199.8 lb

## 2013-10-07 DIAGNOSIS — I714 Abdominal aortic aneurysm, without rupture, unspecified: Secondary | ICD-10-CM

## 2013-10-07 MED ORDER — IOHEXOL 350 MG/ML SOLN
80.0000 mL | Freq: Once | INTRAVENOUS | Status: AC | PRN
  Administered 2013-10-07: 80 mL via INTRAVENOUS

## 2013-10-07 NOTE — Progress Notes (Signed)
Subjective:     Patient ID: Andrew Vega, male   DOB: 03/19/1926, 79 y.o.   MRN: [id number]  HPI this 79-year-old male returns today with a repeat CT angiogram having been done earlier today to discuss this and his upcoming endovascular stent graft repair of his abdominal aortic aneurysm. He has had a Cardiolite nuclear stress test done which was low risk with an EF of 59% and no ischemia.   Past Medical History  Diagnosis Date  . Anxiety   . Hyperlipidemia   . Abdominal aneurysm without mention of rupture   . Insomnia, unspecified   . Colon polyps   . Arthritis   . Diverticulosis of colon (without mention of hemorrhage)   . COPD (chronic obstructive pulmonary disease)   . Sleep apnea   . Inguinal hernia   . Cancer     skin cancer removal    History  Substance Use Topics  . Smoking status: Former Smoker    Types: Cigarettes    Quit date: 10/23/1984  . Smokeless tobacco: Never Used  . Alcohol Use: 0.6 oz/week    1 Glasses of wine per week    Family History  Problem Relation Age of Onset  . Colon cancer Neg Hx   . Heart attack Father 67    Allergies  Allergen Reactions  . Celebrex [Celecoxib]     Raised B/P   . Codeine     Severe stomach pain     Current outpatient prescriptions:aspirin 81 MG tablet, Take 81 mg by mouth daily., Disp: , Rfl: ;  B Complex-C (SUPER B COMPLEX PO), Take by mouth daily., Disp: , Rfl: ;  Cholecalciferol (VITAMIN D) 2000 UNITS CAPS, Take by mouth daily., Disp: , Rfl: ;  citalopram (CELEXA) 20 MG tablet, Take 1 tablet (20 mg total) by mouth daily., Disp: 90 tablet, Rfl: 3 Glucosamine-Chondroit-Vit C-Mn (GLUCOSAMINE CHONDR 1500 COMPLX PO), Take 1 capsule by mouth daily., Disp: , Rfl: ;  LORazepam (ATIVAN) 1 MG tablet, Take 1 tablet by mouth daily at night and 1/2 tablet during the day "AS NEEDED", Disp: 45 tablet, Rfl: 0;  Melatonin 3 MG TABS, Take by mouth at bedtime., Disp: , Rfl: ;  mometasone (NASONEX) 50 MCG/ACT nasal spray, Place 2 sprays  into the nose daily., Disp: 17 g, Rfl: 12 Multiple Vitamin (MULTIVITAMIN) tablet, Take 1 tablet by mouth daily., Disp: , Rfl: ;  niacin 100 MG tablet, Take 250 mg by mouth daily with breakfast. , Disp: , Rfl: ;  Omega-3 Fatty Acids (FISH OIL) 1000 MG CAPS, Take 1 capsule by mouth daily., Disp: , Rfl: ;  simvastatin (ZOCOR) 40 MG tablet, Take one tablet once a day at bedtime for cholesterol supplement, Disp: 90 tablet, Rfl: 3 vitamin E 400 UNIT capsule, Take 400 Units by mouth daily., Disp: , Rfl:   BP 133/82  Pulse 72  Resp 18  Ht 5' 10" (1.778 m)  Wt 199 lb 12.8 oz (90.629 kg)  BMI 28.67 kg/m2  Body mass index is 28.67 kg/(m^2).           Review of Systems denies chest pain, dyspnea on exertion, PND, orthopnea, hemoptysis, claudication     Objective:   Physical Exam BP 133/82  Pulse 72  Resp 18  Ht 5' 10" (1.778 m)  Wt 199 lb 12.8 oz (90.629 kg)  BMI 28.67 kg/m2  Gen.-alert and oriented x3 in no apparent distress HEENT normal for age Lungs no rhonchi or wheezing Cardiovascular regular rhythm no murmurs carotid   pulses 3+ palpable no bruits audible Abdomen soft nontender-5 cm pulsatile mass  Musculoskeletal free of  major deformities Skin clear -no rashes Neurologic normal Lower extremities 3+ femoral and posterior tibial pulses palpable bilaterally with no edema  Today I reviewed the CT angiogram of the abdomen and pelvis by computer. There does to appear to be a satisfactory neck proximally. The aneurysm is approximately 5.4 cm in maximum diameter. 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        Assessment:     Infrarenal, aortic aneurysm-plan EVAR on Wednesday, January 7-will review CTA with Dr. Brabham regarding possible need for embolization of left internal iliac. I discussed this possibility with patient.    Plan:     Plan endovascular stent graft repair of bowel aortic aneurysm on  Wednesday, January 7. Risk and benefits thoroughly discussed with patient he would like to proceed      

## 2013-10-08 ENCOUNTER — Encounter: Payer: Self-pay | Admitting: *Deleted

## 2013-10-08 DIAGNOSIS — F4322 Adjustment disorder with anxiety: Secondary | ICD-10-CM | POA: Diagnosis not present

## 2013-10-10 ENCOUNTER — Other Ambulatory Visit: Payer: Self-pay

## 2013-10-10 DIAGNOSIS — L570 Actinic keratosis: Secondary | ICD-10-CM | POA: Diagnosis not present

## 2013-10-10 DIAGNOSIS — L821 Other seborrheic keratosis: Secondary | ICD-10-CM | POA: Diagnosis not present

## 2013-10-21 ENCOUNTER — Encounter (HOSPITAL_COMMUNITY): Payer: Self-pay

## 2013-10-21 ENCOUNTER — Encounter (HOSPITAL_COMMUNITY)
Admission: RE | Admit: 2013-10-21 | Discharge: 2013-10-21 | Disposition: A | Discharge status: Home / Self Care | Payer: Medicare Other | Source: Ambulatory Visit | Attending: Vascular Surgery | Admitting: Vascular Surgery

## 2013-10-21 DIAGNOSIS — Z01812 Encounter for preprocedural laboratory examination: Secondary | ICD-10-CM | POA: Insufficient documentation

## 2013-10-21 DIAGNOSIS — Z01818 Encounter for other preprocedural examination: Secondary | ICD-10-CM | POA: Diagnosis not present

## 2013-10-21 HISTORY — DX: Personal history of other diseases of the digestive system: Z87.19

## 2013-10-21 HISTORY — DX: Angina pectoris, unspecified: I20.9

## 2013-10-21 LAB — URINALYSIS, ROUTINE W REFLEX MICROSCOPIC
Bilirubin Urine: NEGATIVE
Glucose, UA: NEGATIVE mg/dL
Hgb urine dipstick: NEGATIVE
Ketones, ur: NEGATIVE mg/dL
Leukocytes, UA: NEGATIVE
Nitrite: NEGATIVE
Protein, ur: NEGATIVE mg/dL
Specific Gravity, Urine: 1.02 (ref 1.005–1.030)
Urobilinogen, UA: 0.2 mg/dL (ref 0.0–1.0)
pH: 5.5 (ref 5.0–8.0)

## 2013-10-21 LAB — BLOOD GAS, ARTERIAL
Acid-base deficit: 0.6 mmol/L (ref 0.0–2.0)
Bicarbonate: 23.2 meq/L (ref 20.0–24.0)
Drawn by: 344381
O2 Saturation: 94.4 %
Patient temperature: 98.6
TCO2: 24.3 mmol/L (ref 0–100)
pCO2 arterial: 36.1 mmHg (ref 35.0–45.0)
pH, Arterial: 7.424 (ref 7.350–7.450)
pO2, Arterial: 68 mmHg — ABNORMAL LOW (ref 80.0–100.0)

## 2013-10-21 LAB — APTT: aPTT: 27 s (ref 24–37)

## 2013-10-21 LAB — PROTIME-INR
INR: 1.08 (ref 0.00–1.49)
Prothrombin Time: 13.8 seconds (ref 11.6–15.2)

## 2013-10-21 LAB — COMPREHENSIVE METABOLIC PANEL WITH GFR
ALT: 27 U/L (ref 0–53)
AST: 24 U/L (ref 0–37)
Albumin: 3.6 g/dL (ref 3.5–5.2)
Alkaline Phosphatase: 63 U/L (ref 39–117)
BUN: 23 mg/dL (ref 6–23)
CO2: 21 meq/L (ref 19–32)
Calcium: 8.8 mg/dL (ref 8.4–10.5)
Chloride: 105 meq/L (ref 96–112)
Creatinine, Ser: 1.18 mg/dL (ref 0.50–1.35)
GFR calc Af Amer: 63 mL/min — ABNORMAL LOW
GFR calc non Af Amer: 54 mL/min — ABNORMAL LOW
Glucose, Bld: 82 mg/dL (ref 70–99)
Potassium: 4.2 meq/L (ref 3.7–5.3)
Sodium: 140 meq/L (ref 137–147)
Total Bilirubin: 0.5 mg/dL (ref 0.3–1.2)
Total Protein: 7.3 g/dL (ref 6.0–8.3)

## 2013-10-21 LAB — ABO/RH: ABO/RH(D): A POS

## 2013-10-21 LAB — SURGICAL PCR SCREEN
MRSA, PCR: NEGATIVE
Staphylococcus aureus: POSITIVE — AB

## 2013-10-21 LAB — CBC
HCT: 41 % (ref 39.0–52.0)
Hemoglobin: 14.3 g/dL (ref 13.0–17.0)
MCH: 31.8 pg (ref 26.0–34.0)
Platelets: 153 10*3/uL (ref 150–400)
RBC: 4.49 MIL/uL (ref 4.22–5.81)
WBC: 7 10*3/uL (ref 4.0–10.5)

## 2013-10-21 NOTE — Pre-Procedure Instructions (Signed)
Andrew Vega  10/21/2013   Your procedure is scheduled on:  10/29/13  Report to [facility]  2 * 3 at 630 AM.  Call this number if you have problems the morning of surgery: [phone]   Remember:   Do not eat food or drink liquids after midnight.   Take these medicines the morning of surgery with A SIP OF WATER: celexa,ativan   Do not wear jewelry, make-up or nail polish.  Do not wear lotions, powders, or perfumes. You may wear deodorant.  Do not shave 48 hours prior to surgery. Men may shave face and neck.  Do not bring valuables to the hospital.  [facility] is not responsible                  for any belongings or valuables.               Contacts, dentures or bridgework may not be worn into surgery.  Leave suitcase in the car. After surgery it may be brought to your room.  For patients admitted to the hospital, discharge time is determined by your                treatment team.               Patients discharged the day of surgery will not be allowed to drive  home.  Name and phone number of your driver:   Special Instructions: Shower using CHG 2 nights before surgery and the night before surgery.  If you shower the day of surgery use CHG.  Use special wash - you have one bottle of CHG for all showers.  You should use approximately 1/3 of the bottle for each shower.   Please read over the following fact sheets that you were given: Pain Booklet, Coughing and Deep Breathing, Blood Transfusion Information, MRSA Information and Surgical Site Infection Prevention

## 2013-10-22 ENCOUNTER — Other Ambulatory Visit: Payer: Self-pay

## 2013-10-22 DIAGNOSIS — Z22321 Carrier or suspected carrier of Methicillin susceptible Staphylococcus aureus: Secondary | ICD-10-CM

## 2013-10-22 MED ORDER — MUPIROCIN 2 % EX OINT: TOPICAL_OINTMENT | CUTANEOUS | Status: DC

## 2013-10-22 NOTE — Progress Notes (Signed)
Received phone call from [place], [place] @ [facility] with report of pt's pre-op nasal swab tested positive for Staph Aureus.  Rx for Mupirocin Ointment sent to pt's. pharmacy per hospital protocol.  Will notify pt. via phone to start medication as directed.

## 2013-10-28 MED ORDER — DEXTROSE 5 % IV SOLN
1.5000 g | INTRAVENOUS | Status: AC
  Administered 2013-10-29: 1.5 g via INTRAVENOUS
  Filled 2013-10-28: qty 1.5

## 2013-10-29 ENCOUNTER — Inpatient Hospital Stay (HOSPITAL_COMMUNITY)
Admission: RE | Admit: 2013-10-29 | Discharge: 2013-10-30 | DRG: 238 | Disposition: A | Discharge status: Home / Self Care | Payer: Medicare Other | Source: Ambulatory Visit | Attending: Surgery | Admitting: Surgery

## 2013-10-29 ENCOUNTER — Encounter (HOSPITAL_COMMUNITY): Payer: Self-pay | Admitting: *Deleted

## 2013-10-29 ENCOUNTER — Other Ambulatory Visit: Payer: Self-pay | Admitting: *Deleted

## 2013-10-29 ENCOUNTER — Inpatient Hospital Stay (HOSPITAL_COMMUNITY): Payer: Medicare Other

## 2013-10-29 ENCOUNTER — Encounter (HOSPITAL_COMMUNITY): Payer: Medicare Other | Admitting: Certified Registered Nurse Anesthetist

## 2013-10-29 ENCOUNTER — Encounter (HOSPITAL_COMMUNITY)
Admission: RE | Disposition: A | Discharge status: Home / Self Care | Payer: Self-pay | Source: Ambulatory Visit | Attending: Surgery

## 2013-10-29 ENCOUNTER — Inpatient Hospital Stay (HOSPITAL_COMMUNITY): Payer: Medicare Other | Admitting: Certified Registered Nurse Anesthetist

## 2013-10-29 DIAGNOSIS — J9819 Other pulmonary collapse: Secondary | ICD-10-CM | POA: Diagnosis not present

## 2013-10-29 DIAGNOSIS — I714 Abdominal aortic aneurysm, without rupture, unspecified: Secondary | ICD-10-CM

## 2013-10-29 DIAGNOSIS — J449 Chronic obstructive pulmonary disease, unspecified: Secondary | ICD-10-CM | POA: Diagnosis present

## 2013-10-29 DIAGNOSIS — K5730 Diverticulosis of large intestine without perforation or abscess without bleeding: Secondary | ICD-10-CM | POA: Diagnosis present

## 2013-10-29 DIAGNOSIS — Z860100 Personal history of colon polyps, unspecified: Secondary | ICD-10-CM

## 2013-10-29 DIAGNOSIS — G4730 Sleep apnea, unspecified: Secondary | ICD-10-CM

## 2013-10-29 DIAGNOSIS — Z7982 Long term (current) use of aspirin: Secondary | ICD-10-CM | POA: Diagnosis not present

## 2013-10-29 DIAGNOSIS — E785 Hyperlipidemia, unspecified: Secondary | ICD-10-CM | POA: Diagnosis not present

## 2013-10-29 DIAGNOSIS — F411 Generalized anxiety disorder: Secondary | ICD-10-CM | POA: Diagnosis present

## 2013-10-29 DIAGNOSIS — Z8249 Family history of ischemic heart disease and other diseases of the circulatory system: Secondary | ICD-10-CM

## 2013-10-29 DIAGNOSIS — G4700 Insomnia, unspecified: Secondary | ICD-10-CM | POA: Diagnosis present

## 2013-10-29 DIAGNOSIS — Z87891 Personal history of nicotine dependence: Secondary | ICD-10-CM | POA: Diagnosis not present

## 2013-10-29 DIAGNOSIS — Z79899 Other long term (current) drug therapy: Secondary | ICD-10-CM

## 2013-10-29 DIAGNOSIS — J4489 Other specified chronic obstructive pulmonary disease: Secondary | ICD-10-CM | POA: Diagnosis present

## 2013-10-29 DIAGNOSIS — D126 Benign neoplasm of colon, unspecified: Secondary | ICD-10-CM | POA: Diagnosis not present

## 2013-10-29 HISTORY — PX: ABDOMINAL AORTIC ENDOVASCULAR STENT GRAFT: SHX5707

## 2013-10-29 LAB — BASIC METABOLIC PANEL
BUN: 20 mg/dL (ref 6–23)
CO2: 24 mEq/L (ref 19–32)
Calcium: 8 mg/dL — ABNORMAL LOW (ref 8.4–10.5)
Chloride: 108 mEq/L (ref 96–112)
Creatinine, Ser: 1.08 mg/dL (ref 0.50–1.35)
GFR, EST AFRICAN AMERICAN: 70 mL/min — AB (ref >90)
GFR, EST NON AFRICAN AMERICAN: 61 mL/min — AB (ref >90)
Glucose, Bld: 98 mg/dL (ref 70–99)
Potassium: 4 mEq/L (ref 3.7–5.3)
Sodium: 143 mEq/L (ref 137–147)

## 2013-10-29 LAB — CBC
HEMATOCRIT: 36.1 % — AB (ref 39.0–52.0)
Hemoglobin: 12.9 g/dL — ABNORMAL LOW (ref 13.0–17.0)
MCH: 32.3 pg (ref 26.0–34.0)
MCHC: 35.7 g/dL (ref 30.0–36.0)
MCV: 90.3 fL (ref 78.0–100.0)
Platelets: 122 10*3/uL — ABNORMAL LOW (ref 150–400)
RBC: 4 MIL/uL — ABNORMAL LOW (ref 4.22–5.81)
RDW: 13.3 % (ref 11.5–15.5)
WBC: 5.6 10*3/uL (ref 4.0–10.5)

## 2013-10-29 LAB — APTT: aPTT: 33 seconds (ref 24–37)

## 2013-10-29 LAB — PROTIME-INR
INR: 1.21 (ref 0.00–1.49)
Prothrombin Time: 15 seconds (ref 11.6–15.2)

## 2013-10-29 LAB — MAGNESIUM: MAGNESIUM: 2 mg/dL (ref 1.5–2.5)

## 2013-10-29 SURGERY — INSERTION, ENDOVASCULAR STENT GRAFT, AORTA, ABDOMINAL
Anesthesia: General | Site: Abdomen

## 2013-10-29 MED ORDER — SENNOSIDES-DOCUSATE SODIUM 8.6-50 MG PO TABS
1.0000 | ORAL_TABLET | Freq: Every evening | ORAL | Status: DC | PRN
  Filled 2013-10-29: qty 1

## 2013-10-29 MED ORDER — ONDANSETRON HCL 4 MG/2ML IJ SOLN
4.0000 mg | Freq: Four times a day (QID) | INTRAMUSCULAR | Status: DC | PRN
  Administered 2013-10-29: 4 mg via INTRAVENOUS
  Filled 2013-10-29: qty 2

## 2013-10-29 MED ORDER — DEXTROSE 5 % IV SOLN
1.5000 g | Freq: Two times a day (BID) | INTRAVENOUS | Status: AC
  Administered 2013-10-29 – 2013-10-30 (×2): 1.5 g via INTRAVENOUS
  Filled 2013-10-29 (×2): qty 1.5

## 2013-10-29 MED ORDER — PANTOPRAZOLE SODIUM 40 MG PO TBEC
40.0000 mg | DELAYED_RELEASE_TABLET | Freq: Every day | ORAL | Status: DC
  Administered 2013-10-29 – 2013-10-30 (×2): 40 mg via ORAL
  Filled 2013-10-29 (×2): qty 1

## 2013-10-29 MED ORDER — PHENYLEPHRINE HCL 10 MG/ML IJ SOLN
10.0000 mg | INTRAVENOUS | Status: DC | PRN
  Administered 2013-10-29: 25 ug/min via INTRAVENOUS

## 2013-10-29 MED ORDER — SODIUM CHLORIDE 0.9 % IV SOLN
INTRAVENOUS | Status: DC
Start: 2013-10-29 — End: 2013-10-30
  Administered 2013-10-29 (×3): via INTRAVENOUS

## 2013-10-29 MED ORDER — HYDROMORPHONE HCL PF 1 MG/ML IJ SOLN: 0.5000 mg | INTRAMUSCULAR | Status: DC | PRN

## 2013-10-29 MED ORDER — NEOSTIGMINE METHYLSULFATE 1 MG/ML IJ SOLN
INTRAMUSCULAR | Status: DC | PRN
  Administered 2013-10-29: 4 mg via INTRAVENOUS

## 2013-10-29 MED ORDER — LACTATED RINGERS IV SOLN
INTRAVENOUS | Status: DC | PRN
  Administered 2013-10-29: 08:00:00 via INTRAVENOUS

## 2013-10-29 MED ORDER — EPHEDRINE SULFATE 50 MG/ML IJ SOLN
INTRAMUSCULAR | Status: DC | PRN
  Administered 2013-10-29: 10 mg via INTRAVENOUS
  Administered 2013-10-29: 5 mg via INTRAVENOUS

## 2013-10-29 MED ORDER — SODIUM CHLORIDE 0.9 % IV SOLN
INTRAVENOUS | Status: DC
Start: 2013-10-29 — End: 2013-10-29

## 2013-10-29 MED ORDER — SODIUM CHLORIDE 0.9 % IV SOLN: 500.0000 mL | Freq: Once | INTRAVENOUS | Status: AC | PRN

## 2013-10-29 MED ORDER — POTASSIUM CHLORIDE CRYS ER 20 MEQ PO TBCR: 20.0000 meq | EXTENDED_RELEASE_TABLET | Freq: Every day | ORAL | Status: DC | PRN

## 2013-10-29 MED ORDER — DOCUSATE SODIUM 100 MG PO CAPS
100.0000 mg | ORAL_CAPSULE | Freq: Every day | ORAL | Status: DC
  Administered 2013-10-30: 100 mg via ORAL
  Filled 2013-10-29: qty 1

## 2013-10-29 MED ORDER — DROPERIDOL 2.5 MG/ML IJ SOLN: 0.6250 mg | INTRAMUSCULAR | Status: DC | PRN

## 2013-10-29 MED ORDER — REMIFENTANIL HCL 1 MG IV SOLR
INTRAVENOUS | Status: DC | PRN
  Administered 2013-10-29: .375 ug/kg/min via INTRAVENOUS

## 2013-10-29 MED ORDER — ONDANSETRON HCL 4 MG/2ML IJ SOLN
INTRAMUSCULAR | Status: DC | PRN
  Administered 2013-10-29: 4 mg via INTRAVENOUS

## 2013-10-29 MED ORDER — OXYCODONE-ACETAMINOPHEN 5-325 MG PO TABS: 1.0000 | ORAL_TABLET | ORAL | Status: DC | PRN

## 2013-10-29 MED ORDER — PHENOL 1.4 % MT LIQD: 1.0000 | OROMUCOSAL | Status: DC | PRN

## 2013-10-29 MED ORDER — LABETALOL HCL 5 MG/ML IV SOLN
10.0000 mg | INTRAVENOUS | Status: DC | PRN
Start: 2013-10-29 — End: 2013-10-30

## 2013-10-29 MED ORDER — HYDRALAZINE HCL 20 MG/ML IJ SOLN
10.0000 mg | INTRAMUSCULAR | Status: DC | PRN
Start: 2013-10-29 — End: 2013-10-30
  Administered 2013-10-29 (×2): 10 mg via INTRAVENOUS
  Filled 2013-10-29: qty 1

## 2013-10-29 MED ORDER — SIMVASTATIN 40 MG PO TABS
40.0000 mg | ORAL_TABLET | Freq: Every day | ORAL | Status: DC
  Administered 2013-10-29: 40 mg via ORAL
  Filled 2013-10-29 (×2): qty 1

## 2013-10-29 MED ORDER — ASPIRIN 81 MG PO CHEW
81.0000 mg | CHEWABLE_TABLET | Freq: Every day | ORAL | Status: DC
  Administered 2013-10-29 – 2013-10-30 (×2): 81 mg via ORAL
  Filled 2013-10-29 (×2): qty 1

## 2013-10-29 MED ORDER — NIACIN 250 MG PO TABS
250.0000 mg | ORAL_TABLET | Freq: Every day | ORAL | Status: DC
Start: 2013-10-30 — End: 2013-10-30
  Administered 2013-10-30: 250 mg via ORAL
  Filled 2013-10-29 (×2): qty 1

## 2013-10-29 MED ORDER — ENOXAPARIN SODIUM 30 MG/0.3ML ~~LOC~~ SOLN
30.0000 mg | SUBCUTANEOUS | Status: DC
  Administered 2013-10-30: 30 mg via SUBCUTANEOUS
  Filled 2013-10-29 (×2): qty 0.3

## 2013-10-29 MED ORDER — DOPAMINE-DEXTROSE 3.2-5 MG/ML-% IV SOLN: 3.0000 ug/kg/min | INTRAVENOUS | Status: DC

## 2013-10-29 MED ORDER — ACETAMINOPHEN 325 MG PO TABS: 325.0000 mg | ORAL_TABLET | ORAL | Status: DC | PRN

## 2013-10-29 MED ORDER — HEPARIN SODIUM (PORCINE) 1000 UNIT/ML IJ SOLN
INTRAMUSCULAR | Status: DC | PRN
  Administered 2013-10-29: 8000 [IU] via INTRAVENOUS

## 2013-10-29 MED ORDER — METOPROLOL TARTRATE 1 MG/ML IV SOLN: 2.0000 mg | INTRAVENOUS | Status: DC | PRN

## 2013-10-29 MED ORDER — PROPOFOL 10 MG/ML IV BOLUS
INTRAVENOUS | Status: DC | PRN
  Administered 2013-10-29: 200 mg via INTRAVENOUS

## 2013-10-29 MED ORDER — HYDRALAZINE HCL 20 MG/ML IJ SOLN
INTRAMUSCULAR | Status: AC
  Administered 2013-10-29: 14:00:00 10 mg via INTRAVENOUS
  Filled 2013-10-29: qty 1

## 2013-10-29 MED ORDER — FLUTICASONE PROPIONATE 50 MCG/ACT NA SUSP
1.0000 | Freq: Every day | NASAL | Status: DC
  Filled 2013-10-29: qty 16

## 2013-10-29 MED ORDER — LIDOCAINE HCL (CARDIAC) 20 MG/ML IV SOLN
INTRAVENOUS | Status: DC | PRN
  Administered 2013-10-29: 40 mg via INTRAVENOUS

## 2013-10-29 MED ORDER — GUAIFENESIN-DM 100-10 MG/5ML PO SYRP: 15.0000 mL | ORAL_SOLUTION | ORAL | Status: DC | PRN

## 2013-10-29 MED ORDER — FENTANYL CITRATE 0.05 MG/ML IJ SOLN
25.0000 ug | INTRAMUSCULAR | Status: DC | PRN
  Administered 2013-10-29 (×3): 25 ug via INTRAVENOUS

## 2013-10-29 MED ORDER — FENTANYL CITRATE 0.05 MG/ML IJ SOLN
INTRAMUSCULAR | Status: AC
  Administered 2013-10-29: 25 ug via INTRAVENOUS
  Filled 2013-10-29: qty 2

## 2013-10-29 MED ORDER — 0.9 % SODIUM CHLORIDE (POUR BTL) OPTIME
TOPICAL | Status: DC | PRN
  Administered 2013-10-29: 1000 mL

## 2013-10-29 MED ORDER — MUPIROCIN 2 % EX OINT
TOPICAL_OINTMENT | Freq: Two times a day (BID) | CUTANEOUS | Status: DC
  Administered 2013-10-29 – 2013-10-30 (×2): via TOPICAL
  Filled 2013-10-29 (×2): qty 22

## 2013-10-29 MED ORDER — IODIXANOL 320 MG/ML IV SOLN
INTRAVENOUS | Status: DC | PRN
  Administered 2013-10-29: 71.2 mL via INTRAVENOUS

## 2013-10-29 MED ORDER — GLYCOPYRROLATE 0.2 MG/ML IJ SOLN
INTRAMUSCULAR | Status: DC | PRN
  Administered 2013-10-29: 0.6 mg via INTRAVENOUS

## 2013-10-29 MED ORDER — PROTAMINE SULFATE 10 MG/ML IV SOLN
INTRAVENOUS | Status: DC | PRN
  Administered 2013-10-29: 5 mg via INTRAVENOUS
  Administered 2013-10-29: 45 mg via INTRAVENOUS

## 2013-10-29 MED ORDER — ROCURONIUM BROMIDE 100 MG/10ML IV SOLN
INTRAVENOUS | Status: DC | PRN
  Administered 2013-10-29: 50 mg via INTRAVENOUS
  Administered 2013-10-29 (×3): 10 mg via INTRAVENOUS

## 2013-10-29 MED ORDER — CITALOPRAM HYDROBROMIDE 20 MG PO TABS
20.0000 mg | ORAL_TABLET | Freq: Every day | ORAL | Status: DC
  Administered 2013-10-29 – 2013-10-30 (×2): 20 mg via ORAL
  Filled 2013-10-29 (×2): qty 1

## 2013-10-29 MED ORDER — FENTANYL CITRATE 0.05 MG/ML IJ SOLN
INTRAMUSCULAR | Status: DC | PRN
  Administered 2013-10-29: 50 ug via INTRAVENOUS

## 2013-10-29 MED ORDER — LABETALOL HCL 5 MG/ML IV SOLN
INTRAVENOUS | Status: DC | PRN
  Administered 2013-10-29: 5 mg via INTRAVENOUS

## 2013-10-29 MED ORDER — ACETAMINOPHEN 650 MG RE SUPP: 325.0000 mg | RECTAL | Status: DC | PRN

## 2013-10-29 MED ORDER — MAGNESIUM SULFATE 40 MG/ML IJ SOLN
2.0000 g | Freq: Every day | INTRAMUSCULAR | Status: DC | PRN
  Filled 2013-10-29: qty 50

## 2013-10-29 MED ORDER — ALUM & MAG HYDROXIDE-SIMETH 200-200-20 MG/5ML PO SUSP
15.0000 mL | ORAL | Status: DC | PRN
  Administered 2013-10-29: 30 mL via ORAL
  Filled 2013-10-29: qty 30

## 2013-10-29 MED ORDER — SODIUM CHLORIDE 0.9 % IR SOLN
Status: DC | PRN
  Administered 2013-10-29: 08:00:00

## 2013-10-29 SURGICAL SUPPLY — 77 items
BAG BANDED W/RUBBER/TAPE 36X54 (MISCELLANEOUS) ×3 IMPLANT
BAG SNAP BAND KOVER 36X36 (MISCELLANEOUS) ×9 IMPLANT
BLADE SURG ROTATE 9660 (MISCELLANEOUS) ×3 IMPLANT
CANISTER SUCTION 2500CC (MISCELLANEOUS) ×3 IMPLANT
CATH BEACON 5.038 65CM KMP-01 (CATHETERS) ×3 IMPLANT
CATH OMNI FLUSH .035X70CM (CATHETERS) ×3 IMPLANT
CLIP TI MEDIUM 24 (CLIP) IMPLANT
CLIP TI WIDE RED SMALL 24 (CLIP) IMPLANT
COVER DOME SNAP 22 D (MISCELLANEOUS) ×3 IMPLANT
COVER MAYO STAND STRL (DRAPES) ×3 IMPLANT
COVER PROBE W GEL 5X96 (DRAPES) ×3 IMPLANT
COVER SURGICAL LIGHT HANDLE (MISCELLANEOUS) ×3 IMPLANT
DERMABOND ADVANCED (GAUZE/BANDAGES/DRESSINGS) ×4
DERMABOND ADVANCED .7 DNX12 (GAUZE/BANDAGES/DRESSINGS) ×2 IMPLANT
DEVICE CLOSURE PERCLS PRGLD 6F (VASCULAR PRODUCTS) ×4 IMPLANT
DRAPE BILATERAL SPLIT (DRAPES) ×3 IMPLANT
DRAPE TABLE COVER HEAVY DUTY (DRAPES) ×3 IMPLANT
DRESSING OPSITE X SMALL 2X3 (GAUZE/BANDAGES/DRESSINGS) ×6 IMPLANT
DRYSEAL FLEXSHEATH 12FR 33CM (SHEATH) ×2
DRYSEAL FLEXSHEATH 18FR 33CM (SHEATH) ×2
ELECT CAUTERY BLADE 6.4 (BLADE) IMPLANT
ELECT REM PT RETURN 9FT ADLT (ELECTROSURGICAL) ×6
ELECTRODE REM PT RTRN 9FT ADLT (ELECTROSURGICAL) ×2 IMPLANT
EXCLUDER TNK 28X14.5MMX12CM (Endovascular Graft) ×1 IMPLANT
EXCLUDER TRUNK 28X14.5MMX12CM (Endovascular Graft) ×3 IMPLANT
GAUZE SPONGE 2X2 8PLY NS (GAUZE/BANDAGES/DRESSINGS) ×6 IMPLANT
GAUZE SPONGE 2X2 8PLY STRL LF (GAUZE/BANDAGES/DRESSINGS) ×1 IMPLANT
GLOVE BIOGEL PI IND STRL 7.5 (GLOVE) ×2 IMPLANT
GLOVE BIOGEL PI INDICATOR 7.5 (GLOVE) ×4
GLOVE SS BIOGEL STRL SZ 7 (GLOVE) ×1 IMPLANT
GLOVE SUPERSENSE BIOGEL SZ 7 (GLOVE) ×2
GLOVE SURG SS PI 7.5 STRL IVOR (GLOVE) ×3 IMPLANT
GOWN STRL NON-REIN LRG LVL3 (GOWN DISPOSABLE) ×9 IMPLANT
GRAFT BALLN CATH 65CM (STENTS) ×1 IMPLANT
GRAFT EXCLUDER AORTIC 28X3.3CM (Endovascular Graft) ×3 IMPLANT
GRAFT EXCLUDER LEG 16X12 (Endovascular Graft) ×3 IMPLANT
KIT BASIN OR (CUSTOM PROCEDURE TRAY) ×3 IMPLANT
KIT ROOM TURNOVER OR (KITS) ×3 IMPLANT
LEG CONTRALATERAL 16X20X13.5 (Vascular Products) ×2 IMPLANT
LEG CONTRALATERAL 16X20X9.5 (Endovascular Graft) ×2 IMPLANT
NEEDLE PERC 18GX7CM (NEEDLE) ×3 IMPLANT
NS IRRIG 1000ML POUR BTL (IV SOLUTION) ×3 IMPLANT
PACK AORTA (CUSTOM PROCEDURE TRAY) ×3 IMPLANT
PAD ARMBOARD 7.5X6 YLW CONV (MISCELLANEOUS) ×6 IMPLANT
PENCIL BUTTON HOLSTER BLD 10FT (ELECTRODE) IMPLANT
PERCLOSE PROGLIDE 6F (VASCULAR PRODUCTS) ×12
PROTECTION STATION PRESSURIZED (MISCELLANEOUS) ×3
SET MICROPUNCTURE 5F STIFF (MISCELLANEOUS) ×3 IMPLANT
SHEATH AVANTI 11CM 8FR (MISCELLANEOUS) ×3 IMPLANT
SHEATH BRITE TIP 8FR 23CM (MISCELLANEOUS) ×3 IMPLANT
SHEATH DRYSEAL FLEX 12FR 33CM (SHEATH) ×1 IMPLANT
SHEATH DRYSEAL FLEX 18FR 33CM (SHEATH) ×1 IMPLANT
SPONGE GAUZE 2X2 STER 10/PKG (GAUZE/BANDAGES/DRESSINGS) ×2
STATION PROTECTION PRESSURIZED (MISCELLANEOUS) ×1 IMPLANT
STENT GRAFT BALLN CATH 65CM (STENTS) ×2
STENT GRAFT CONTRALAT 16X20X9. (Endovascular Graft) ×1 IMPLANT
STENT GRAFT CONTRALAT 20X13.5 (Vascular Products) ×1 IMPLANT
STOPCOCK MORSE 400PSI 3WAY (MISCELLANEOUS) ×3 IMPLANT
SUT PROLENE 5 0 C 1 24 (SUTURE) IMPLANT
SUT PROLENE 5 0 CC 1 (SUTURE) IMPLANT
SUT PROLENE 6 0 C 1 30 (SUTURE) IMPLANT
SUT VIC AB 2-0 CT1 27 (SUTURE)
SUT VIC AB 2-0 CT1 TAPERPNT 27 (SUTURE) IMPLANT
SUT VIC AB 3-0 SH 27 (SUTURE)
SUT VIC AB 3-0 SH 27X BRD (SUTURE) IMPLANT
SUT VICRYL 4-0 PS2 18IN ABS (SUTURE) ×6 IMPLANT
SYR 20CC LL (SYRINGE) ×6 IMPLANT
SYR 30ML LL (SYRINGE) ×3 IMPLANT
SYR 5ML LL (SYRINGE) IMPLANT
SYR MEDRAD MARK V 150ML (SYRINGE) ×3 IMPLANT
SYRINGE 10CC LL (SYRINGE) ×9 IMPLANT
TOWEL OR 17X24 6PK STRL BLUE (TOWEL DISPOSABLE) ×6 IMPLANT
TOWEL OR 17X26 10 PK STRL BLUE (TOWEL DISPOSABLE) ×6 IMPLANT
TRAY FOLEY CATH 16FRSI W/METER (SET/KITS/TRAYS/PACK) ×3 IMPLANT
TUBING HIGH PRESSURE 120CM (CONNECTOR) ×3 IMPLANT
WIRE AMPLATZ SS-J .035X180CM (WIRE) ×6 IMPLANT
WIRE BENTSON .035X145CM (WIRE) ×12 IMPLANT

## 2013-10-29 NOTE — Anesthesia Postprocedure Evaluation (Signed)
  Anesthesia Post-op Note  Patient: Andrew Vega  Procedure(s) Performed: Procedure(s): ABDOMINAL AORTIC ENDOVASCULAR STENT GRAFT (N/A)  Patient Location: PACU  Anesthesia Type:General  Level of Consciousness: awake, alert , oriented and patient cooperative  Airway and Oxygen Therapy: Patient Spontanous Breathing and Patient connected to nasal cannula oxygen  Post-op Pain: mild  Post-op Assessment: Post-op Vital signs reviewed, Patient's Cardiovascular Status Stable, Respiratory Function Stable, Patent Airway, No signs of Nausea or vomiting and Pain level controlled  Post-op Vital Signs: Reviewed and stable  Complications: No apparent anesthesia complications

## 2013-10-29 NOTE — Op Note (Signed)
Patient name: Andrew Vega MRN: [id number] DOB: [birth year] Sex: male  10/29/2013 Pre-operative Diagnosis: Abdominal aortic aneurysm Post-operative diagnosis:  Same Surgeon:  Eldridge Abrahams Assistants:  Lennie Muckle, PA Procedure:   #1: Endovascular repair of abdominal aortic aneurysm   #2: Ultrasound-guided access, bilateral common femoral artery   #3: Abdominal aortogram   #4: Catheter in aorta x2   #5: Distal extension x2   #6: Proximal extension x1 Anesthesia:  Gen. Blood Loss:  See anesthesia record Specimens:  None  Findings:  Successful exclusion of the aneurysm.  Following initial deployment of the graft there was a type IA leak which was treated with a proximal extension  Device use: Main body was primary left Gore 28 x 14 x 12.  Ipsilateral left extension was a Gore 20 x 9.5.  Contralateral right was a Gore 12 x 7, followed by a Gore 20 x 13.5.  Proximal extension was a Gore 28 x 3.3  Indications:  The patient has been followed for a progressively enlarging aneurysm.  He is now in the size needing repair.  He was seen preoperatively by Dr. Kellie Simmering.  Due to external circumstances, Dr. Kellie Simmering was not able to be here for the procedure.  I discussed that with the patient on the telephone the night prior to the operation and he was willing to proceed with me as the primary surgeon.  Procedure:  The patient was identified in the holding area and taken to [place] 16  The patient was then placed supine on the table. general anesthesia was administered.  The patient was prepped and draped in the usual sterile fashion.  A time out was called and antibiotics were administered.  Ultrasound was used to violate bilateral common femoral arteries which are widely patent with minimal calcification.  A #11 blade was used to make a skin neck bilaterally.  Each common femoral artery was then cannulated under ultrasound guidance with an 18-gauge needle.  An 035 wire was advanced without  resistance.  Subcutaneous tract was dilated with an 8 [place] dilator.  Pro-glide devices were deployed at the 11:00 and 1:00 position for pre-closure.  8 French sheaths were placed bilaterally.  The patient was fully heparinized.  A Amplatz superstiff wire was then exchanged to on the left.  A 18 French sheath was then placed.  The main body device was then prepared on the back table.  This was a Dealer 28 x 14 x 12.  It was advanced through the sheath and placed at the anticipated level of the renal arteries.  A Omni flush catheter was then advanced up the right side.  An abdominal aortogram was performed, locating the renal arteries.  The device was then successfully deployed down to the contralateral gate.  Next, using a Kumpe catheter and a Benson wire the contralateral gate was cannulated.  A Omni flush catheter was freely rotated within the main body of the device, confirming successful cannulation.  With the image detected and a left anterior oblique position a retrograde injection was performed, locating the right hypogastric artery.  A 12 French sheath was then inserted into the contralateral gate.  The first iliac extension was a Gore 12 x 7 device.  This was deployed with adequate overlap.  The second extension was a Gore 20 x 3.5.  This was deployed, landing at the level of the right hypogastric artery.  Next, the remaining portion of the ipsilateral M. was deployed and the main  body delivery system was removed.  The image detected was rotated to a right anterior oblique position and a retrograde injection was performed through the sheath, locating the left hypogastric artery.  A left-sided extension was then prepared on the back table.  This was a Gore 20 x 9.5 device.  It was deployed landing at the level a left hypogastric artery.  Next, the proximal and distal attachment sites as well as device overlap areas were molded with a Q-50 balloon.  Completion arteriogram was performed.  I felt that  there was most likely a type I-a endoleak.  I elected to treat this by placing a proximal cuff.  A 28 x 3.3 device was then inserted.  It was molded with a balloon and a completion arteriogram revealed resolution of the endoleak.  At this point [employer] wires were placed bilaterally.  The sheaths were removed and the pro-glide devices were successfully utilized to close the cannulation sites.  The patient's heparin was reversed with 50 mg of protamine.  Both groins were hemostatic.  The skin nick was closed with 4-0 Vicryl.  The patient has palpable pedal pulses.  There were no complications.   Disposition:  To PACU in stable condition.   Theotis Burrow, M.D. [facility] Office: [phone] Pager:  [phone]

## 2013-10-29 NOTE — H&P (View-Only) (Signed)
Subjective:     Patient ID: Andrew Vega, male   DOB: August 22, 1926, 80 y.o.   MRN: [id number]  HPI this 80 year old male returns today with a repeat CT angiogram having been done earlier today to discuss this and his upcoming endovascular stent graft repair of his abdominal aortic aneurysm. He has had a Cardiolite nuclear stress test done which was low risk with an EF of 59% and no ischemia.   Past Medical History  Diagnosis Date  . Anxiety   . Hyperlipidemia   . Abdominal aneurysm without mention of rupture   . Insomnia, unspecified   . Colon polyps   . Arthritis   . Diverticulosis of colon (without mention of hemorrhage)   . COPD (chronic obstructive pulmonary disease)   . Sleep apnea   . Inguinal hernia   . Cancer     skin cancer removal    History  Substance Use Topics  . Smoking status: Former Smoker    Types: Cigarettes    Quit date: 10/23/1984  . Smokeless tobacco: Never Used  . Alcohol Use: 0.6 oz/week    1 Glasses of wine per week    Family History  Problem Relation Age of Onset  . Colon cancer Neg Hx   . Heart attack Father 42    Allergies  Allergen Reactions  . Celebrex [Celecoxib]     Raised B/P   . Codeine     Severe stomach pain     Current outpatient prescriptions:aspirin 81 MG tablet, Take 81 mg by mouth daily., Disp: , Rfl: ;  B Complex-C (SUPER B COMPLEX PO), Take by mouth daily., Disp: , Rfl: ;  Cholecalciferol (VITAMIN D) 2000 UNITS CAPS, Take by mouth daily., Disp: , Rfl: ;  citalopram (CELEXA) 20 MG tablet, Take 1 tablet (20 mg total) by mouth daily., Disp: 90 tablet, Rfl: 3 Glucosamine-Chondroit-Vit C-Mn (GLUCOSAMINE CHONDR 1500 COMPLX PO), Take 1 capsule by mouth daily., Disp: , Rfl: ;  LORazepam (ATIVAN) 1 MG tablet, Take 1 tablet by mouth daily at night and 1/2 tablet during the day "AS NEEDED", Disp: 45 tablet, Rfl: 0;  Melatonin 3 MG TABS, Take by mouth at bedtime., Disp: , Rfl: ;  mometasone (NASONEX) 50 MCG/ACT nasal spray, Place 2 sprays  into the nose daily., Disp: 17 g, Rfl: 12 Multiple Vitamin (MULTIVITAMIN) tablet, Take 1 tablet by mouth daily., Disp: , Rfl: ;  niacin 100 MG tablet, Take 250 mg by mouth daily with breakfast. , Disp: , Rfl: ;  Omega-3 Fatty Acids (FISH OIL) 1000 MG CAPS, Take 1 capsule by mouth daily., Disp: , Rfl: ;  simvastatin (ZOCOR) 40 MG tablet, Take one tablet once a day at bedtime for cholesterol supplement, Disp: 90 tablet, Rfl: 3 vitamin E 400 UNIT capsule, Take 400 Units by mouth daily., Disp: , Rfl:   BP 133/82  Pulse 72  Resp 18  Ht 5\' 10"  (1.778 m)  Wt 199 lb 12.8 oz (90.629 kg)  BMI 28.67 kg/m2  Body mass index is 28.67 kg/(m^2).           Review of Systems denies chest pain, dyspnea on exertion, PND, orthopnea, hemoptysis, claudication     Objective:   Physical Exam BP 133/82  Pulse 72  Resp 18  Ht 5\' 10"  (1.778 m)  Wt 199 lb 12.8 oz (90.629 kg)  BMI 28.67 kg/m2  Gen.-alert and oriented x3 in no apparent distress HEENT normal for age Lungs no rhonchi or wheezing Cardiovascular regular rhythm no murmurs carotid  pulses 3+ palpable no bruits audible Abdomen soft nontender-5 cm pulsatile mass  Musculoskeletal free of  major deformities Skin clear -no rashes Neurologic normal Lower extremities 3+ femoral and posterior tibial pulses palpable bilaterally with no edema  Today I reviewed the CT angiogram of the abdomen and pelvis by computer. There does to appear to be a satisfactory neck proximally. The aneurysm is approximately 5.4 cm in maximum diameter. 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        Assessment:     Infrarenal, aortic aneurysm-plan EVAR on Wednesday, January 7-will review CTA with Dr. Trula Slade regarding possible need for embolization of left internal iliac. I discussed this possibility with patient.    Plan:     Plan endovascular stent graft repair of bowel aortic aneurysm on  Wednesday, January 7. Risk and benefits thoroughly discussed with patient he would like to proceed

## 2013-10-29 NOTE — Anesthesia Procedure Notes (Addendum)
Procedure Name: Intubation Date/Time: 10/29/2013 8:51 AM Performed by: Trixie Deis A Pre-anesthesia Checklist: Patient identified, Emergency Drugs available, Suction available and Patient being monitored Patient Re-evaluated:Patient Re-evaluated prior to inductionOxygen Delivery Method: Circle system utilized Preoxygenation: Pre-oxygenation with 100% oxygen Intubation Type: IV induction Ventilation: Mask ventilation without difficulty Grade View: Grade I Tube type: Oral Tube size: 7.5 mm Number of attempts: 1 Airway Equipment and Method: Stylet and Video-laryngoscopy Placement Confirmation: ETT inserted through vocal cords under direct vision,  positive ETCO2 and breath sounds checked- equal and bilateral Secured at: 20 cm Tube secured with: Tape Dental Injury: Teeth and Oropharynx as per pre-operative assessment  Comments: Intubation performed by Melene Plan

## 2013-10-29 NOTE — Anesthesia Preprocedure Evaluation (Addendum)
Anesthesia Evaluation  Patient identified by MRN, date of birth, ID band Patient awake    Reviewed: Allergy & Precautions, H&P , NPO status , Patient's Chart, lab work & pertinent test results  History of Anesthesia Complications Negative for: history of anesthetic complications  Airway Mallampati: II TM Distance: >3 FB Neck ROM: Full    Dental  (+) Chipped and Dental Advisory Given   Pulmonary COPDformer smoker (quit '86),  breath sounds clear to auscultation  Pulmonary exam normal       Cardiovascular - angina+ Peripheral Vascular Disease (AAA) Rhythm:Regular Rate:Normal  11/14 ECHO: EF 82%, grade 1 diastolic dysfunction, aortic sclerosis without stenosis 11/14 stress test: normal LVF, normal perfusion, EF 59%   Neuro/Psych    GI/Hepatic Neg liver ROS, GERD-  Controlled,  Endo/Other  negative endocrine ROS  Renal/GU negative Renal ROS     Musculoskeletal   Abdominal (+) + obese,   Peds  Hematology negative hematology ROS (+)   Anesthesia Other Findings   Reproductive/Obstetrics                         Anesthesia Physical Anesthesia Plan  ASA: III  Anesthesia Plan: General   Post-op Pain Management:    Induction: Intravenous  Airway Management Planned: Oral ETT  Additional Equipment: Arterial line  Intra-op Plan:   Post-operative Plan: Extubation in OR  Informed Consent: I have reviewed the patients History and Physical, chart, labs and discussed the procedure including the risks, benefits and alternatives for the proposed anesthesia with the patient or authorized representative who has indicated his/her understanding and acceptance.   Dental advisory given  Plan Discussed with: Surgeon and CRNA  Anesthesia Plan Comments: (Plan routine monitors, A line, GETA)        Anesthesia Quick Evaluation

## 2013-10-29 NOTE — Interval H&P Note (Signed)
History and Physical Interval Note:  10/29/2013 7:16 AM  Andrew Vega  has presented today for surgery, with the diagnosis of Abdominal Aortic Aneurysm  The various methods of treatment have been discussed with the patient and family. After consideration of risks, benefits and other options for treatment, the patient has consented to  Procedure(s): ABDOMINAL AORTIC ENDOVASCULAR STENT GRAFT (N/A) as a surgical intervention .  The patient's history has been reviewed, patient examined, no change in status, stable for surgery.  I have reviewed the patient's chart and labs.  Questions were answered to the patient's satisfaction.     BRABHAM IV, V. WELLS

## 2013-10-29 NOTE — Transfer of Care (Signed)
Immediate Anesthesia Transfer of Care Note  Patient: Andrew Vega  Procedure(s) Performed: Procedure(s): ABDOMINAL AORTIC ENDOVASCULAR STENT GRAFT (N/A)  Patient Location: PACU  Anesthesia Type:General  Level of Consciousness: awake, alert  and oriented  Airway & Oxygen Therapy: Patient Spontanous Breathing and Patient connected to nasal cannula oxygen  Post-op Assessment: Report given to PACU RN, Post -op Vital signs reviewed and stable and Patient moving all extremities  Post vital signs: stable  Complications: No apparent anesthesia complications

## 2013-10-29 NOTE — Progress Notes (Signed)
Utilization review completed.  

## 2013-10-30 ENCOUNTER — Other Ambulatory Visit: Payer: Self-pay | Admitting: *Deleted

## 2013-10-30 ENCOUNTER — Telehealth: Payer: Self-pay | Admitting: Surgery

## 2013-10-30 DIAGNOSIS — I714 Abdominal aortic aneurysm, without rupture, unspecified: Secondary | ICD-10-CM

## 2013-10-30 DIAGNOSIS — Z48812 Encounter for surgical aftercare following surgery on the circulatory system: Secondary | ICD-10-CM

## 2013-10-30 LAB — BASIC METABOLIC PANEL
BUN: 15 mg/dL (ref 6–23)
CO2: 22 mEq/L (ref 19–32)
Calcium: 8.2 mg/dL — ABNORMAL LOW (ref 8.4–10.5)
Chloride: 103 mEq/L (ref 96–112)
Creatinine, Ser: 1.13 mg/dL (ref 0.50–1.35)
GFR calc Af Amer: 66 mL/min — ABNORMAL LOW (ref >90)
GFR, EST NON AFRICAN AMERICAN: 57 mL/min — AB (ref >90)
GLUCOSE: 107 mg/dL — AB (ref 70–99)
Potassium: 3.7 mEq/L (ref 3.7–5.3)
Sodium: 140 mEq/L (ref 137–147)

## 2013-10-30 LAB — TYPE AND SCREEN
ABO/RH(D): A POS
Antibody Screen: NEGATIVE
Unit division: 0
Unit division: 0

## 2013-10-30 LAB — CBC
HCT: 32.9 % — ABNORMAL LOW (ref 39.0–52.0)
HEMOGLOBIN: 11.6 g/dL — AB (ref 13.0–17.0)
MCH: 32.1 pg (ref 26.0–34.0)
MCHC: 35.3 g/dL (ref 30.0–36.0)
MCV: 91.1 fL (ref 78.0–100.0)
Platelets: 153 10*3/uL (ref 150–400)
RBC: 3.61 MIL/uL — ABNORMAL LOW (ref 4.22–5.81)
RDW: 13.5 % (ref 11.5–15.5)
WBC: 7.9 10*3/uL (ref 4.0–10.5)

## 2013-10-30 MED ORDER — OXYCODONE-ACETAMINOPHEN 5-325 MG PO TABS: 1.0000 | ORAL_TABLET | Freq: Four times a day (QID) | ORAL | Status: DC | PRN

## 2013-10-30 MED ORDER — LORAZEPAM 1 MG PO TABS
1.0000 mg | ORAL_TABLET | Freq: Every day | ORAL | Status: DC
  Administered 2013-10-30: 1 mg via ORAL
  Filled 2013-10-30: qty 1

## 2013-10-30 NOTE — Progress Notes (Signed)
Patient discharged to home via wheelchair at 1350 with son. Patient alert and oriented and verbalized understanding of discharge instructions after review

## 2013-10-30 NOTE — Telephone Encounter (Signed)
Message copied by Berniece Salines on Thu Oct 30, 2013  2:47 PM ------      Message from: [place], [place] M      Created: Thu Oct 30, 2013  1:58 PM       F/U with Dr. Trula Slade in 4 weeks S/P EVAR needs CT protocol prior to F/U. ------

## 2013-10-30 NOTE — Telephone Encounter (Signed)
Spoke with son. Asked him to have pt call us (son in a meeting) to get post op appt info and new home phone #, listed # dc - [phone]

## 2013-10-30 NOTE — Discharge Summary (Signed)
[facility] Discharge Summary   Patient ID:  Andrew Vega MRN: [id number] DOB/AGE: June 25, 1926 80 y.o.  Admit date: 10/29/2013 Discharge date: 10/30/2013 Date of Surgery: 10/29/2013 Surgeon: Surgeon(s): [name], MD  Admission Diagnosis: Abdominal Aortic Aneurysm  Discharge Diagnoses:  Abdominal Aortic Aneurysm  Secondary Diagnoses: Past Medical History  Diagnosis Date  . Anxiety   . Hyperlipidemia   . Abdominal aneurysm without mention of rupture   . Insomnia, unspecified   . Colon polyps   . Arthritis   . Diverticulosis of colon (without mention of hemorrhage)   . COPD (chronic obstructive pulmonary disease)   . Inguinal hernia   . Cancer     skin cancer removal  . Anginal pain     recent hospitalization  . H/O hiatal hernia     Procedure(s): ABDOMINAL AORTIC ENDOVASCULAR STENT GRAFT  Discharged Condition: good  HPI: HPI this 80 year old male returns today with a repeat CT angiogram having been done earlier today to discuss this and his upcoming endovascular stent graft repair of his abdominal aortic aneurysm. He has had a Cardiolite nuclear stress test done which was low risk with an EF of 59% and no ischemia. The CT angiogram of the abdomen and pelvis by computer. There does to appear to be a satisfactory neck proximally. The aneurysm is approximately 5.4 cm in maximum diameter. 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   He underwent EVAR by Dr. Trula Slade 10/29/2013 and was admitted to 3300 over night.  His stay was uneventful.  This am he is ambulating, taking PO's well, and pain is well controlled.        Hospital Course:  Andrew Vega is a 80 y.o. male is S/P Procedure(s): ABDOMINAL AORTIC ENDOVASCULAR STENT GRAFT Extubated: POD # 0 Physical exam: Abdomen is soft and nontender  Palpable dorsalis pedis pulse bilaterally  Bilateral groin incisions are soft   Post-op wounds healing well Pt. Ambulating, voiding and taking PO diet without difficulty. Pt pain controlled with PO pain meds. Labs as below Complications:none  Consults:     Significant Diagnostic Studies: CBC Lab Results  Component Value Date   WBC 7.9 10/30/2013   HGB 11.6* 10/30/2013   HCT 32.9* 10/30/2013   MCV 91.1 10/30/2013   PLT 153 10/30/2013    BMET    Component Value Date/Time   NA 140 10/30/2013 0630   NA 143 04/11/2013 0916   K 3.7 10/30/2013 0630   CL 103 10/30/2013 0630   CO2 22 10/30/2013 0630   GLUCOSE 107* 10/30/2013 0630   GLUCOSE 101* 04/11/2013 0916   BUN 15 10/30/2013 0630   BUN 17 04/11/2013 0916   CREATININE 1.13 10/30/2013 0630   CREATININE 1.24 10/03/2013 1000   CALCIUM 8.2* 10/30/2013 0630   GFRNONAA 57* 10/30/2013 0630   GFRAA 66* 10/30/2013 0630   COAG Lab Results  Component Value Date   INR 1.21 10/29/2013   INR 1.08 10/21/2013     Disposition:  Discharge to :Home Discharge Orders   Future Orders Complete By Expires   Call MD for:  redness, tenderness, or signs of infection (pain, swelling, bleeding, redness, odor or green/yellow discharge around incision site)  As directed    Call MD for:  severe or increased pain, loss or decreased feeling  in affected limb(s)  As directed    Call MD for:  temperature >100.5  As directed    Driving Restrictions  As directed    Comments:     No driving for 2 weeks   Increase activity slowly  As directed    Comments:     Walk with assistance use walker or cane as needed   Lifting restrictions  As directed    Comments:     No lifting for 6 weeks   May shower   As directed    Scheduling Instructions:     May shower tomorrow.   Resume previous diet  As directed        Medication List         aspirin 81 MG tablet  Take 81 mg by mouth daily.     citalopram 20 MG tablet  Commonly known as:  CELEXA  Take 1 tablet (20 mg total) by mouth daily.     Fish Oil 1000 MG Caps  Take 1 capsule by mouth daily.      GLUCOSAMINE CHONDR 1500 COMPLX PO  Take 1 capsule by mouth daily.     LORazepam 1 MG tablet  Commonly known as:  ATIVAN  Take 1 tablet by mouth daily at night and 1/2 tablet during the day "AS NEEDED"     Melatonin 3 MG Tabs  Take 3 tablets by mouth at bedtime.     mometasone 50 MCG/ACT nasal spray  Commonly known as:  NASONEX  Place 2 sprays into the nose daily.     multivitamin tablet  Take 1 tablet by mouth daily.     mupirocin ointment 2 %  Commonly known as:  BACTROBAN  1 Application, nasally, 2 times daily for 5 days prior to surgery     niacin 100 MG tablet  Take 250 mg by mouth daily with breakfast.     oxyCODONE-acetaminophen 5-325 MG per tablet  Commonly known as:  PERCOCET/ROXICET  Take 1 tablet by mouth every 6 (six) hours as needed for moderate pain.     simvastatin 40 MG tablet  Commonly known as:  ZOCOR  Take one tablet once a day at bedtime for cholesterol supplement     SUPER B COMPLEX PO  Take by mouth daily.     Vitamin D 2000 UNITS Caps  Take by mouth daily.     vitamin E 400 UNIT capsule  Take 400 Units by mouth daily.       Verbal and written Discharge instructions given to the patient. Wound care per Discharge AVS     Follow-up Information   Follow up with Trula Slade IV, [place], MD In 4 weeks. (sent)    Specialty:  Vascular Surgery   Contact information:   [address] [place] [address] [phone]       Signed: Laurence Slate [facility] 10/30/2013, 2:00 PM  - For [facility] Registry use --- Instructions: Press F2 to tab through selections.  Delete question if not applicable.   Post-op:  Time to Extubation: [x ] In OR, [ ]  < 12 hrs, [ ]  12-24 hrs, [ ]  >=24 hrs Vasopressors Req. Post-op: No MI: [x ] No, [ ]  Troponin only, [ ]  EKG or Clinical New Arrhythmia: No CHF: No ICU Stay: 1 days Transfusion: No  If yes, 0 units given  Complications: Resp failure: [x ] none, [ ]  Pneumonia, [ ]  Ventilator Chg in renal function: [x ] none, [ ]   Inc. Cr > 0.5, [ ]  Temp. Dialysis, [ ]  Permanent dialysis Leg ischemia: [ x] No, [ ]  Yes, no Surgery needed, [ ]  Yes, Surgery  needed, [ ]  Amputation Bowel ischemia: [x ] No, [ ]  Medical Rx, [ ]  Surgical Rx Wound complication: [x ] No, [ ]  Superficial separation/infection, [ ]  Return to OR Return to OR: No  Return to OR for bleeding: No Stroke: [x ] None, [ ]  Minor, [ ]  Major  Discharge medications: Statin use:  Yes ASA use:  Yes Plavix use:  No  for medical reason   Beta blocker use:  No  for medical reason

## 2013-10-30 NOTE — Progress Notes (Signed)
    Subjective  - POD #1, status post endovascular aneurysm repair  No complaints today Tolerating oral foods. Able to spontaneously void after Foley removed Able family within the halls   Physical Exam:  Abdomen is soft and nontender Palpable dorsalis pedis pulse bilaterally Bilateral groin incisions are soft       Assessment/Plan:  POD #1  The patient will be able to be discharged home today.  He will followup in one month with a CT and 2 g of the abdomen and pelvis  Ramaya Guile IV, V. WELLS 10/30/2013 12:57 PM --  Filed Vitals:   10/30/13 1130  BP:   Pulse:   Temp: 99.4 F (37.4 C)  Resp:     Intake/Output Summary (Last 24 hours) at 10/30/13 1257 Last data filed at 10/30/13 1130  Gross per 24 hour  Intake 2227.5 ml  Output   1300 ml  Net  927.5 ml     Laboratory CBC    Component Value Date/Time   WBC 7.9 10/30/2013 0630   WBC 7.6 04/11/2013 0916   HGB 11.6* 10/30/2013 0630   HCT 32.9* 10/30/2013 0630   PLT 153 10/30/2013 0630    BMET    Component Value Date/Time   NA 140 10/30/2013 0630   NA 143 04/11/2013 0916   K 3.7 10/30/2013 0630   CL 103 10/30/2013 0630   CO2 22 10/30/2013 0630   GLUCOSE 107* 10/30/2013 0630   GLUCOSE 101* 04/11/2013 0916   BUN 15 10/30/2013 0630   BUN 17 04/11/2013 0916   CREATININE 1.13 10/30/2013 0630   CREATININE 1.24 10/03/2013 1000   CALCIUM 8.2* 10/30/2013 0630   GFRNONAA 57* 10/30/2013 0630   GFRAA 66* 10/30/2013 0630    COAG Lab Results  Component Value Date   INR 1.21 10/29/2013   INR 1.08 10/21/2013   No results found for this basename: PTT    Antibiotics Anti-infectives   Start     Dose/Rate Route Frequency Ordered Stop   10/29/13 1615  cefUROXime (ZINACEF) 1.5 g in dextrose 5 % 50 mL IVPB     1.5 g 100 mL/hr over 30 Minutes Intravenous Every 12 hours 10/29/13 1610 10/30/13 0517   10/28/13 1433  cefUROXime (ZINACEF) 1.5 g in dextrose 5 % 50 mL IVPB     1.5 g 100 mL/hr over 30 Minutes Intravenous 30 min pre-op 10/28/13 1433  10/29/13 0854       V. Leia Alf, M.D. [facility] [place] Office: [phone] Pager:  [phone]

## 2013-10-31 NOTE — Discharge Summary (Signed)
I agree with the above  Andrew Vega 

## 2013-11-03 ENCOUNTER — Encounter (HOSPITAL_COMMUNITY): Payer: Self-pay | Admitting: Surgery

## 2013-11-10 ENCOUNTER — Other Ambulatory Visit: Payer: Self-pay | Admitting: Internal Medicine

## 2013-11-11 ENCOUNTER — Other Ambulatory Visit: Payer: Self-pay | Admitting: *Deleted

## 2013-11-11 MED ORDER — LORAZEPAM 1 MG PO TABS: ORAL_TABLET | ORAL | Status: DC

## 2013-11-12 ENCOUNTER — Other Ambulatory Visit: Payer: Self-pay | Admitting: *Deleted

## 2013-11-12 DIAGNOSIS — F4322 Adjustment disorder with anxiety: Secondary | ICD-10-CM | POA: Diagnosis not present

## 2013-11-12 MED ORDER — LORAZEPAM 1 MG PO TABS: ORAL_TABLET | ORAL | Status: DC

## 2013-11-18 DIAGNOSIS — M79609 Pain in unspecified limb: Secondary | ICD-10-CM | POA: Diagnosis not present

## 2013-11-18 DIAGNOSIS — B351 Tinea unguium: Secondary | ICD-10-CM | POA: Diagnosis not present

## 2013-11-19 ENCOUNTER — Ambulatory Visit (INDEPENDENT_AMBULATORY_CARE_PROVIDER_SITE_OTHER): Payer: Medicare Other | Admitting: Nurse Practitioner

## 2013-11-19 ENCOUNTER — Encounter: Payer: Self-pay | Admitting: Nurse Practitioner

## 2013-11-19 VITALS — BP 130/82 | HR 72 | Temp 97.6°F | Wt 193.0 lb

## 2013-11-19 DIAGNOSIS — I714 Abdominal aortic aneurysm, without rupture, unspecified: Secondary | ICD-10-CM

## 2013-11-19 DIAGNOSIS — E785 Hyperlipidemia, unspecified: Secondary | ICD-10-CM | POA: Diagnosis not present

## 2013-11-19 DIAGNOSIS — F411 Generalized anxiety disorder: Secondary | ICD-10-CM | POA: Diagnosis not present

## 2013-11-19 DIAGNOSIS — F4322 Adjustment disorder with anxiety: Secondary | ICD-10-CM | POA: Diagnosis not present

## 2013-11-19 DIAGNOSIS — J309 Allergic rhinitis, unspecified: Secondary | ICD-10-CM | POA: Diagnosis not present

## 2013-11-19 DIAGNOSIS — G4700 Insomnia, unspecified: Secondary | ICD-10-CM

## 2013-11-19 NOTE — Progress Notes (Signed)
Patient ID: Andrew Vega, male   DOB: 1926-01-11, 80 y.o.   MRN: [id number]    Allergies  Allergen Reactions  . Celebrex [Celecoxib]     Raised B/P   . Codeine     Severe stomach pain     Chief Complaint  Patient presents with  . Follow-up    post-surgery f/u   . other    c/o having post-nasal drip & having to clear throat sometimes with a cough.  using Nasonex with no relief.    HPI: Patient is a 80 y.o. male seen in the office today for follow up on AAA repair and routine follow up on chronic conditions. He underwent EVAR by Dr. Trula Slade 10/29/2013 and hospital stay was uneventful.  Pt had no pain but became tired easy; was using a cane but now has not needed it; reports he is not at 100% but energy is slowly getting better; has follow up with vascular next week.   -still getting therapy for anxiety and due wife having advanced dementia  -taking 1/2 of celexa 20 mg therefore taking 10 mg at night   Taking nasonex 2 sprays in each nostril but is not working good; throat has been scratchy-- has been having cough at times; sometimes with productive sputum sometimes dry; with nasal congestion; no fevers or chills    Review of Systems:  Review of Systems  Constitutional: Negative for fever, chills, malaise/fatigue and diaphoresis.  Respiratory: Negative for cough and shortness of breath.   Cardiovascular: Negative for chest pain and leg swelling.  Gastrointestinal: Negative for heartburn, diarrhea and constipation.  Genitourinary: Negative for dysuria.  Musculoskeletal: Negative for myalgias.  Skin: Negative for itching and rash.  Neurological: Negative for weakness and headaches.  Psychiatric/Behavioral: Negative for depression. The patient is nervous/anxious and has insomnia.      Past Medical History  Diagnosis Date  . Anxiety   . Hyperlipidemia   . Abdominal aneurysm without mention of rupture   . Insomnia, unspecified   . Colon polyps   . Arthritis   . Diverticulosis  of colon (without mention of hemorrhage)   . COPD (chronic obstructive pulmonary disease)   . Inguinal hernia   . Cancer     skin cancer removal  . Anginal pain     recent hospitalization  . H/O hiatal hernia    Past Surgical History  Procedure Laterality Date  . Vasectomy  1962  . Cholecystectomy  1962  . Colon surgery  2007  . Bilateral shoulder repair    . Nasal septum surgery  2011  . Hernia repair    . Abdominal aortic endovascular stent graft N/A 10/29/2013    Procedure: ABDOMINAL AORTIC ENDOVASCULAR STENT GRAFT;  Surgeon: [name], MD;  Location: [facility] OR;  Service: Vascular;  Laterality: N/A;   Social History:   reports that he quit smoking about 29 years ago. His smoking use included Cigarettes. He smoked 0.00 packs per day. He has never used smokeless tobacco. He reports that he drinks about 0.6 ounces of alcohol per week. He reports that he does not use illicit drugs.  Family History  Problem Relation Age of Onset  . Colon cancer Neg Hx   . Heart attack Father 23    Medications: Patient's Medications  New Prescriptions   No medications on file  Previous Medications   ASPIRIN 81 MG TABLET    Take 81 mg by mouth daily.   B COMPLEX-C (SUPER B COMPLEX PO)  Take by mouth daily.   CHOLECALCIFEROL (VITAMIN D) 2000 UNITS CAPS    Take by mouth daily.   CITALOPRAM (CELEXA) 20 MG TABLET    Take 1 tablet (20 mg total) by mouth daily.   GLUCOSAMINE-CHONDROIT-VIT C-MN (GLUCOSAMINE CHONDR 1500 COMPLX PO)    Take 1 capsule by mouth daily.   LORAZEPAM (ATIVAN) 1 MG TABLET    Take one tablet by mouth at bedtime and take 1/2 tablet by mouth during the day as needed   MELATONIN 3 MG TABS    Take 3 tablets by mouth at bedtime.    MOMETASONE (NASONEX) 50 MCG/ACT NASAL SPRAY    Place 2 sprays into the nose daily.   MULTIPLE VITAMIN (MULTIVITAMIN) TABLET    Take 1 tablet by mouth daily.   NIACIN 100 MG TABLET    Take 250 mg by mouth daily with breakfast.    OMEGA-3 FATTY ACIDS  (FISH OIL) 1000 MG CAPS    Take 1 capsule by mouth daily.   OXYCODONE-ACETAMINOPHEN (PERCOCET/ROXICET) 5-325 MG PER TABLET    Take 1 tablet by mouth every 6 (six) hours as needed for moderate pain.   SIMVASTATIN (ZOCOR) 40 MG TABLET    Take one tablet once a day at bedtime for cholesterol supplement   VITAMIN E 400 UNIT CAPSULE    Take 400 Units by mouth daily.  Modified Medications   No medications on file  Discontinued Medications   MUPIROCIN OINTMENT (BACTROBAN) 2 %    1 Application, nasally, 2 times daily for 5 days prior to surgery     Physical Exam:  Filed Vitals:   11/19/13 1453  BP: 130/82  Pulse: 72  Temp: 97.6 F (36.4 C)  TempSrc: Oral  Weight: 193 lb (87.544 kg)  SpO2: 95%    Physical Exam  Constitutional: He is oriented to person, place, and time and well-developed, well-nourished, and in no distress. No distress.  HENT:  Head: Normocephalic and atraumatic.  Eyes: Conjunctivae and EOM are normal. Pupils are equal, round, and reactive to light.  Neck: Normal range of motion. Neck supple.  Cardiovascular: Normal rate, regular rhythm and normal heart sounds.   Pulmonary/Chest: Effort normal and breath sounds normal. No respiratory distress.  Abdominal: Soft. Bowel sounds are normal. He exhibits no distension.  Musculoskeletal: He exhibits no edema and no tenderness.  Neurological: He is alert and oriented to person, place, and time.  Skin: Skin is warm and dry. He is not diaphoretic.  2 well heeled lap sites to bilateral groin   Psychiatric: Affect normal.     Labs reviewed: Basic Metabolic Panel:  Recent Labs  04/11/13 0916  10/21/13 1524 10/29/13 1135 10/30/13 0630  NA 143  < > 140 143 140  K 4.2  < > 4.2 4.0 3.7  CL 104  < > 105 108 103  CO2 26  < > 21 24 22   GLUCOSE 101*  < > 82 98 107*  BUN 17  < > 23 20 15   CREATININE 1.40*  < > 1.18 1.08 1.13  CALCIUM 9.3  < > 8.8 8.0* 8.2*  MG  --   --   --  2.0  --   TSH 1.680  --   --   --   --   < > =  values in this interval not displayed. Liver Function Tests:  Recent Labs  04/11/13 0916 10/21/13 1524  AST 24 24  ALT 21 27  ALKPHOS 73 63  BILITOT 0.5 0.5  PROT 7.1 7.3  ALBUMIN  --  3.6   No results found for this basename: LIPASE, AMYLASE,  in the last 8760 hours No results found for this basename: AMMONIA,  in the last 8760 hours CBC:  Recent Labs  04/11/13 0916  10/21/13 1524 10/29/13 1135 10/30/13 0630  WBC 7.6  < > 7.0 5.6 7.9  NEUTROABS 4.5  --   --   --   --   HGB 14.5  < > 14.3 12.9* 11.6*  HCT 40.8  < > 41.0 36.1* 32.9*  MCV 90  < > 91.3 90.3 91.1  PLT  --   < > 153 122* 153  < > = values in this interval not displayed. Lipid Panel:  Recent Labs  04/11/13 0916  HDL 50  LDLCALC 86  TRIG 121  CHOLHDL 3.2   TSH:  Recent Labs  04/11/13 0916  TSH 1.680   A1C: No components found with this basename: A1C,    Assessment/Plan 1. Anxiety state, unspecified -unchaged; seems to be stable -conts on ativan at night and as needed -taking celexa 10 mg but Rx is for 20 mg-- pt to increase to whole tablet at night  2. Hyperlipidemia -conts on fish oil, zocor, Niacin  - Comprehensive metabolic panel; Future - Lipid panel; Future - CBC With differential/Platelet; Future  3. AAA (abdominal aortic aneurysm) S/p repair; doing well, has follow up with vascular  4. Insomnia -conts to have problem with staying asleep  -conts on melatonin, ativian  And now with celexa which pt reports helps his sleep- to cont this   5. Allergic rhinitis -to start Claritin 10 mg daily -conts nasonex  Follow up in 6 month for EV with lab work prior to visit

## 2013-11-19 NOTE — Patient Instructions (Signed)
loratadine (claritin) daily for allergies   -increase Celexa to 1 tablet daily (at night)  -follow up in 6 months for physical and fasting lab work before that visit

## 2013-11-28 ENCOUNTER — Encounter: Payer: Self-pay | Admitting: Surgery

## 2013-12-01 ENCOUNTER — Ambulatory Visit
Admission: RE | Admit: 2013-12-01 | Discharge: 2013-12-01 | Disposition: A | Discharge status: Home / Self Care | Payer: Medicare Other | Source: Ambulatory Visit | Attending: Surgery | Admitting: Surgery

## 2013-12-01 ENCOUNTER — Encounter: Payer: Self-pay | Admitting: Surgery

## 2013-12-01 ENCOUNTER — Ambulatory Visit (INDEPENDENT_AMBULATORY_CARE_PROVIDER_SITE_OTHER): Payer: Medicare Other | Admitting: Surgery

## 2013-12-01 VITALS — BP 136/77 | HR 86 | Ht 70.0 in | Wt 194.9 lb

## 2013-12-01 DIAGNOSIS — I714 Abdominal aortic aneurysm, without rupture, unspecified: Secondary | ICD-10-CM

## 2013-12-01 DIAGNOSIS — F4322 Adjustment disorder with anxiety: Secondary | ICD-10-CM | POA: Diagnosis not present

## 2013-12-01 DIAGNOSIS — I728 Aneurysm of other specified arteries: Secondary | ICD-10-CM | POA: Diagnosis not present

## 2013-12-01 DIAGNOSIS — I723 Aneurysm of iliac artery: Secondary | ICD-10-CM | POA: Diagnosis not present

## 2013-12-01 DIAGNOSIS — Z48812 Encounter for surgical aftercare following surgery on the circulatory system: Secondary | ICD-10-CM

## 2013-12-01 MED ORDER — IOHEXOL 350 MG/ML SOLN
100.0000 mL | Freq: Once | INTRAVENOUS | Status: AC | PRN
  Administered 2013-12-01: 100 mL via INTRAVENOUS

## 2013-12-01 NOTE — Addendum Note (Signed)
Addended by: Dorthula Rue L on: 12/01/2013 04:24 PM   Modules accepted: Orders

## 2013-12-01 NOTE — Progress Notes (Signed)
The patient is back today for his first postoperative visit.  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  Maximum aortic size was 5.3 cm. I placed a proximal extension for a type I endoleak.  This was originally scheduled to be performed by Dr. Kellie Simmering, however he had a family emergency and therefore I did the procedure.  The patient's postoperative course was uncomplicated.  He is back today without complaints.  Bilateral femoral cannulation sites are well healed.  Extremity is warm well perfused.  Abdomen is soft.  I have reviewed his CT angiogram.  The stent graft is in excellent position.  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  The aneurysm sac size has been unchanged or slightly decreased.  I discussed the findings of the endoleak with the patient.  I have scheduled him to come back in 6 months with a repeat CT angiogram.  In addition, his outside documentation was reviewed and showed a 50% left carotid stenosis 2012.  I will follow this with a carotid ultrasound in 6 months when he returns.

## 2013-12-17 DIAGNOSIS — F4322 Adjustment disorder with anxiety: Secondary | ICD-10-CM | POA: Diagnosis not present

## 2013-12-25 ENCOUNTER — Encounter: Payer: Self-pay | Admitting: *Deleted

## 2013-12-26 ENCOUNTER — Encounter: Payer: Self-pay | Admitting: Internal Medicine

## 2013-12-26 ENCOUNTER — Ambulatory Visit (INDEPENDENT_AMBULATORY_CARE_PROVIDER_SITE_OTHER): Payer: Medicare Other | Admitting: Internal Medicine

## 2013-12-26 VITALS — BP 122/74 | HR 66 | Resp 10 | Wt 200.6 lb

## 2013-12-26 DIAGNOSIS — R059 Cough, unspecified: Secondary | ICD-10-CM

## 2013-12-26 DIAGNOSIS — R05 Cough: Secondary | ICD-10-CM

## 2013-12-26 DIAGNOSIS — G4700 Insomnia, unspecified: Secondary | ICD-10-CM | POA: Diagnosis not present

## 2013-12-26 DIAGNOSIS — I714 Abdominal aortic aneurysm, without rupture, unspecified: Secondary | ICD-10-CM | POA: Diagnosis not present

## 2013-12-26 DIAGNOSIS — E785 Hyperlipidemia, unspecified: Secondary | ICD-10-CM

## 2013-12-26 DIAGNOSIS — R151 Fecal smearing: Secondary | ICD-10-CM

## 2013-12-26 DIAGNOSIS — I6529 Occlusion and stenosis of unspecified carotid artery: Secondary | ICD-10-CM

## 2013-12-26 DIAGNOSIS — J309 Allergic rhinitis, unspecified: Secondary | ICD-10-CM

## 2013-12-26 DIAGNOSIS — R058 Other specified cough: Secondary | ICD-10-CM

## 2013-12-26 DIAGNOSIS — F411 Generalized anxiety disorder: Secondary | ICD-10-CM

## 2013-12-26 NOTE — Progress Notes (Signed)
Patient ID: Camauri Honts, male   DOB: Aug 11, 1926, 80 y.o.   MRN: [id number]   Location:  [facility]    Allergies  Allergen Reactions  . Celebrex [Celecoxib]     Raised B/P   . Codeine     Severe stomach pain     Chief Complaint  Patient presents with  . COPD    Patient c/o COPD worsening   . Referral    Patient requesting referral     HPI: Patient is a 80 y.o.  seen in the office today for medical mgt of chronic diseases.  He is requesting a pulmonary referral for COPD.    Susan's bed alarm went off, but she hadn't moved--he took it off and kept his arm around her the rest of the night.  When caregiver arrived--it had come apart.    COPD is worsening.  Did not know he had it until after his AAA surgery.  Thought he had a continuing postnasal drip.  Then cough developed.  Is taking otc nondrowsy antihistamine and nasonex.  Didn't take anything this morning.  Feels some congestion in his lower chest when coughing.  Antihistamine and nasonex decrease the frequency of the coughing and it is not as prolonged, but still there.  Denies shortness of breath.  Is not very active though.  Quit smoking in 1986--smoked less than a pack a day age [age], then picked up again for 10 years, started again when 45 until 1986.  The CXR from 08/23/13 shows emphysema and old granulomatous disease.  Again, he is not very active and admits to this so he may not exert himself enough to develop any dyspnea.  Sats today 96%.  Feels like his coughing has developed more since incentive spirometer use after his surgery.    Still wakes up in the middle of the night.  Takes a tylenol then (plain tylenol) with benefit.  Only using the lorazepam at bedtime with citalopram.  No longer using the 1/2 tab during the day.  Using melatonin also and simvastatin at hs.  Does take them about an hour before he goes to bed.  Had an extensive discussion about benzo use esp temazepam and why  it was stopped.  Wants to continue lorazepam just at hs.  Also c/o fecal smearing ever since his colonoscopy several years ago.  Discussed bulking stools some.  Requests to see GI specialist, not surgeon b/c he does not want surgery.  Review of Systems:  Review of Systems  Constitutional: Positive for malaise/fatigue. Negative for fever.  HENT: Positive for congestion.   Eyes: Negative for blurred vision.  Respiratory: Positive for cough. Negative for shortness of breath.   Cardiovascular: Negative for chest pain.  Gastrointestinal: Positive for diarrhea.  Genitourinary: Negative for dysuria.  Skin: Negative for rash.  Neurological: Negative for dizziness.  Psychiatric/Behavioral: Positive for depression. The patient is nervous/anxious and has insomnia.     Past Medical History  Diagnosis Date  . Anxiety   . Hyperlipidemia   . Abdominal aneurysm without mention of rupture   . Insomnia, unspecified   . Colon polyps   . Arthritis   . Diverticulosis of colon (without mention of hemorrhage)   . COPD (chronic obstructive pulmonary disease)   . Inguinal hernia   . Cancer     skin cancer removal  . Anginal pain     recent hospitalization  . H/O hiatal hernia     Past Surgical History  Procedure Laterality Date  . Vasectomy  1962  . Cholecystectomy  1962  . Colon surgery  2007  . Bilateral shoulder repair    . Nasal septum surgery  2011  . Hernia repair    . Abdominal aortic endovascular stent graft N/A 10/29/2013    Procedure: ABDOMINAL AORTIC ENDOVASCULAR STENT GRAFT;  Surgeon: [name], MD;  Location: [facility] OR;  Service: Vascular;  Laterality: N/A;    Social History:   reports that he quit smoking about 29 years ago. His smoking use included Cigarettes. He smoked 0.00 packs per day. He has never used smokeless tobacco. He reports that he drinks about 0.6 ounces of alcohol per week. He reports that he does not use illicit drugs.  Family History  Problem Relation Age of  Onset  . Colon cancer Neg Hx   . Heart attack Father 48    Medications: Patient's Medications  New Prescriptions   No medications on file  Previous Medications   ASPIRIN 81 MG TABLET    Take 81 mg by mouth daily.   B COMPLEX-C (SUPER B COMPLEX PO)    Take by mouth daily.   CHOLECALCIFEROL (VITAMIN D) 2000 UNITS CAPS    Take by mouth daily.   CITALOPRAM (CELEXA) 20 MG TABLET    Take 1 tablet (20 mg total) by mouth daily.   GLUCOSAMINE-CHONDROIT-VIT C-MN (GLUCOSAMINE CHONDR 1500 COMPLX PO)    Take 1 capsule by mouth daily.   LORAZEPAM (ATIVAN) 1 MG TABLET    Take one tablet by mouth at bedtime and take 1/2 tablet by mouth during the day as needed   MELATONIN 3 MG TABS    Take 3 tablets by mouth at bedtime.    MOMETASONE (NASONEX) 50 MCG/ACT NASAL SPRAY    Place 2 sprays into the nose daily.   MULTIPLE VITAMIN (MULTIVITAMIN) TABLET    Take 1 tablet by mouth daily.   NIACIN 100 MG TABLET    Take 250 mg by mouth daily with breakfast.    OMEGA-3 FATTY ACIDS (FISH OIL) 1000 MG CAPS    Take 1 capsule by mouth daily.   OXYCODONE-ACETAMINOPHEN (PERCOCET/ROXICET) 5-325 MG PER TABLET    Take 1 tablet by mouth every 6 (six) hours as needed for moderate pain.   SIMVASTATIN (ZOCOR) 40 MG TABLET    Take one tablet once a day at bedtime for cholesterol supplement   VITAMIN E 400 UNIT CAPSULE    Take 400 Units by mouth daily.  Modified Medications   No medications on file  Discontinued Medications   No medications on file     Physical Exam: Filed Vitals:   12/26/13 0849  BP: 122/74  Pulse: 66  Resp: 10  Weight: 200 lb 9.6 oz (90.992 kg)  SpO2: 96%  Physical Exam  Constitutional: He is oriented to person, place, and time. He appears well-developed and well-nourished.  Cardiovascular: Normal rate, regular rhythm, normal heart sounds and intact distal pulses.   Pulmonary/Chest: Effort normal and breath sounds normal.  Abdominal: Soft. Bowel sounds are normal.  Neurological: He is alert and  oriented to person, place, and time.  Skin: Skin is warm and dry.    Labs reviewed: Basic Metabolic Panel:  Recent Labs  04/11/13 0916  10/21/13 1524 10/29/13 1135 10/30/13 0630  NA 143  < > 140 143 140  K 4.2  < > 4.2 4.0 3.7  CL 104  < > 105 108 103  CO2 26  < > 21 24  22  GLUCOSE 101*  < > 82 98 107*  BUN 17  < > 23 20 15   CREATININE 1.40*  < > 1.18 1.08 1.13  CALCIUM 9.3  < > 8.8 8.0* 8.2*  MG  --   --   --  2.0  --   TSH 1.680  --   --   --   --   < > = values in this interval not displayed. Liver Function Tests:  Recent Labs  04/11/13 0916 10/21/13 1524  AST 24 24  ALT 21 27  ALKPHOS 73 63  BILITOT 0.5 0.5  PROT 7.1 7.3  ALBUMIN  --  3.6   No results found for this basename: LIPASE, AMYLASE,  in the last 8760 hours No results found for this basename: AMMONIA,  in the last 8760 hours CBC:  Recent Labs  04/11/13 0916  10/21/13 1524 10/29/13 1135 10/30/13 0630  WBC 7.6  < > 7.0 5.6 7.9  NEUTROABS 4.5  --   --   --   --   HGB 14.5  < > 14.3 12.9* 11.6*  HCT 40.8  < > 41.0 36.1* 32.9*  MCV 90  < > 91.3 90.3 91.1  PLT  --   < > 153 122* 153  < > = values in this interval not displayed. Lipid Panel:  Recent Labs  04/11/13 0916  HDL 50  LDLCALC 86  TRIG 121  CHOLHDL 3.2    Assessment/Plan 1. Productive cough - requests to see pulmonary, needs PFTs  - Ambulatory referral to Pulmonology  2. AAA (abdominal aortic aneurysm) without rupture -s/p surgery, has done well  3. Hyperlipidemia -lipids at goal with meds  4. Early awakening -I would prefer he come off benzos which are affecting his cognition  5. Generalized anxiety disorder -insists on continuing benzos  6. Allergic rhinitis - seems this is truly the cause with PND - Ambulatory referral to Pulmonology  7. Fecal smearing - requests to see GI about this -recommend fiber supplement to bulk stools - Ambulatory referral to Gastroenterology  Labs/tests ordered: do labs today that  were ordered by Janett Billow  Next appt:  3 mos

## 2013-12-31 ENCOUNTER — Other Ambulatory Visit: Payer: Self-pay | Admitting: Nurse Practitioner

## 2014-01-01 DIAGNOSIS — F4322 Adjustment disorder with anxiety: Secondary | ICD-10-CM | POA: Diagnosis not present

## 2014-01-05 ENCOUNTER — Institutional Professional Consult (permissible substitution): Payer: Medicare Other | Admitting: Pulmonary Disease

## 2014-01-13 ENCOUNTER — Encounter: Payer: Self-pay | Admitting: Gastroenterology

## 2014-01-13 ENCOUNTER — Encounter: Payer: Self-pay | Admitting: Pulmonary Disease

## 2014-01-13 ENCOUNTER — Ambulatory Visit (INDEPENDENT_AMBULATORY_CARE_PROVIDER_SITE_OTHER): Payer: Medicare Other | Admitting: Pulmonary Disease

## 2014-01-13 ENCOUNTER — Ambulatory Visit (INDEPENDENT_AMBULATORY_CARE_PROVIDER_SITE_OTHER): Payer: Medicare Other | Admitting: Gastroenterology

## 2014-01-13 VITALS — BP 138/80 | HR 71 | Temp 98.2°F | Ht 68.0 in | Wt 200.8 lb

## 2014-01-13 VITALS — BP 123/70 | HR 88 | Ht 68.0 in | Wt 199.0 lb

## 2014-01-13 DIAGNOSIS — R05 Cough: Secondary | ICD-10-CM | POA: Insufficient documentation

## 2014-01-13 DIAGNOSIS — R151 Fecal smearing: Secondary | ICD-10-CM | POA: Diagnosis not present

## 2014-01-13 DIAGNOSIS — R059 Cough, unspecified: Secondary | ICD-10-CM | POA: Diagnosis not present

## 2014-01-13 DIAGNOSIS — J449 Chronic obstructive pulmonary disease, unspecified: Secondary | ICD-10-CM

## 2014-01-13 DIAGNOSIS — R053 Chronic cough: Secondary | ICD-10-CM | POA: Insufficient documentation

## 2014-01-13 NOTE — Patient Instructions (Signed)
Please start taking citrucel (orange flavored) powder fiber supplement.  This may cause some bloating at first but that usually goes away. Begin with a small spoonful and work your way up to a large, heaping spoonful daily over a week. Kegel excercises 5-10 minutes once daily. Please return to see Dr. Ardis Hughs in  6 weeks, sooner if needed.

## 2014-01-13 NOTE — Assessment & Plan Note (Signed)
Breathing test is [employer] do NOT have COPD Treat sequentially for upper airway cough first, then GERD (if no better)  OK to use DELSYM cough syrup twice daily Use chlortrimeton 4-8 mg instead of claritin daily x 4 weeks

## 2014-01-13 NOTE — Progress Notes (Signed)
HPI: This is a   very pleasant 80 year old man whom I last saw several months ago.  Bowel movements vary, can be diet dependent at times.  Lives at [place].  Between bowel movements, he can get seepage.  Wonders if he has a loose anal sphincter.    This occurred to him 5 years ago after colonoscopy.  He has BM about once per day.  Sometimes solid, sometimes looser.    Will have liquid staining underpants most days per week.  Never sees blood in his stool.  Issue has been building for about a year.  Takes fiber supplement every once in a while   Past Medical History  Diagnosis Date  . Anxiety   . Hyperlipidemia   . Abdominal aneurysm without mention of rupture   . Insomnia, unspecified   . Colon polyps   . Arthritis   . Diverticulosis of colon (without mention of hemorrhage)   . COPD (chronic obstructive pulmonary disease)   . Inguinal hernia   . Cancer     skin cancer removal  . Anginal pain     recent hospitalization  . H/O hiatal hernia     Past Surgical History  Procedure Laterality Date  . Vasectomy  1962  . Cholecystectomy  1962  . Colon surgery  2007  . Bilateral shoulder repair    . Nasal septum surgery  2011  . Hernia repair    . Abdominal aortic endovascular stent graft N/A 10/29/2013    Procedure: ABDOMINAL AORTIC ENDOVASCULAR STENT GRAFT;  Surgeon: [name], MD;  Location: [place];  Service: Vascular;  Laterality: N/A;    Current Outpatient Prescriptions  Medication Sig Dispense Refill  . aspirin 81 MG tablet Take 81 mg by mouth daily.      . B Complex-C (SUPER B COMPLEX PO) Take by mouth daily.      . Cholecalciferol (VITAMIN D) 2000 UNITS CAPS Take by mouth daily.      . citalopram (CELEXA) 20 MG tablet Take 1 tablet (20 mg total) by mouth daily.  90 tablet  3  . citalopram (CELEXA) 40 MG tablet TAKE 1 TABLET BY MOUTH EVERY DAY  30 tablet  5  . Glucosamine-Chondroit-Vit C-Mn (GLUCOSAMINE CHONDR 1500 COMPLX PO) Take 1 capsule by mouth daily.       Marland Kitchen LORazepam (ATIVAN) 1 MG tablet Take one tablet by mouth at bedtime and take 1/2 tablet by mouth during the day as needed  45 tablet  0  . Melatonin 3 MG TABS Take 3 tablets by mouth at bedtime.       . mometasone (NASONEX) 50 MCG/ACT nasal spray Place 2 sprays into the nose daily.  17 g  12  . Multiple Vitamin (MULTIVITAMIN) tablet Take 1 tablet by mouth daily.      . niacin 100 MG tablet Take 250 mg by mouth daily with breakfast.       . Omega-3 Fatty Acids (FISH OIL) 1000 MG CAPS Take 1 capsule by mouth daily.      . simvastatin (ZOCOR) 40 MG tablet Take one tablet once a day at bedtime for cholesterol supplement  90 tablet  3  . vitamin E 400 UNIT capsule Take 400 Units by mouth daily.       No current facility-administered medications for this visit.    Allergies as of 01/13/2014 - Review Complete 01/13/2014  Allergen Reaction Noted  . Celebrex [celecoxib]  07/10/2013  . Codeine  04/14/2013    Family History  Problem Relation Age of Onset  . Colon cancer Neg Hx   . Heart attack Father 48    History   Social History  . Marital Status: Married    Spouse Name: N/A    Number of Children: N/A  . Years of Education: N/A   Occupational History  . Not on file.   Social History Main Topics  . Smoking status: Former Smoker    Types: Cigarettes    Quit date: 10/23/1984  . Smokeless tobacco: Never Used  . Alcohol Use: 0.6 oz/week    1 Glasses of wine per week     Comment: weekly  . Drug Use: No  . Sexual Activity: No   Other Topics Concern  . Not on file   Social History Narrative  . No narrative on file      Physical Exam: BP 123/70  Pulse 88  Ht 5\' 8"  (1.727 m)  Wt 199 lb (90.266 kg)  BMI 30.26 kg/m2 Constitutional: generally well-appearing Psychiatric: alert and oriented x3 Abdomen: soft, nontender, nondistended, no obvious ascites, no peritoneal signs, normal bowel sounds Rectal examination: Small external anal hemorrhoid, nonthrombosed. No fissures.  Anal tone was decreased resting and also on Valsalva. No distal rectal masses, stool is brown and Hemoccult negative    Assessment and plan: 80 y.o. male with fecal soilage  He does have decreased anal tone to my digital examination. I educated him on Kegel exercises and recommended that he do these 5-10 minutes per day. I will also vault his stool with fiber supplements and he'll return to see me in 6 weeks and sooner if needed.

## 2014-01-13 NOTE — Patient Instructions (Addendum)
Breathing test is [employer] do NOT have COPD OK to use DELSYM cough syrup twice daily Use chlortrimeton 4-8 mg instead of claritin daily x 4 weeks

## 2014-01-13 NOTE — Progress Notes (Signed)
Subjective:    Patient ID: Andrew Vega, male    DOB: 03-16-1926, 80 y.o.   MRN: [id number]  HPI 80 year old remote smoker presents for evaluation of chronic cough. He moved from [place] to [place] about a year ago. He reports a deep cough minimally productive of clear phlegm for about a year. He reports postnasal drip which is worse in the mornings and was not relieved by Nasonex and Claritin. He has not tried any over-the-counter cough syrups. He was told by a cardiologist that he may have COPD and is worried about this. He denies wheezing, paroxysmal nocturnal dyspnea, orthopnea or pedal edema. He denies heartburn or difficulty swallowing  Quit smoking in 1986--smoked less than a pack a day age [age],  started again when 35 until 42 - about 6 pyrs.  CXR from 08/23/13 shows emphysema and old granulomatous disease.   Post endovascular repair of infrarenal abdominal aortic aneurysm & bilateral common iliac artery aneurysms  Spirometry - no airway obstruction, ratio 80, FEV1 3.06- 106%, FVC 3.81 - 98% Echo showed normal LV function with RVSP 35  Past Medical History  Diagnosis Date  . Anxiety   . Hyperlipidemia   . Abdominal aneurysm without mention of rupture   . Insomnia, unspecified   . Colon polyps   . Arthritis   . Diverticulosis of colon (without mention of hemorrhage)   . COPD (chronic obstructive pulmonary disease)   . Inguinal hernia   . Cancer     skin cancer removal  . Anginal pain     recent hospitalization  . H/O hiatal hernia    Past Surgical History  Procedure Laterality Date  . Vasectomy  1962  . Cholecystectomy  1962  . Colon surgery  2007  . Bilateral shoulder repair    . Nasal septum surgery  2011  . Hernia repair    . Abdominal aortic endovascular stent graft N/A 10/29/2013    Procedure: ABDOMINAL AORTIC ENDOVASCULAR STENT GRAFT;  Surgeon: [name], MD;  Location: [facility] OR;  Service: Vascular;  Laterality: N/A;    Allergies  Allergen  Reactions  . Celebrex [Celecoxib]     Raised B/P   . Codeine     Severe stomach pain     History   Social History  . Marital Status: Married    Spouse Name: N/A    Number of Children: N/A  . Years of Education: N/A   Occupational History  . retired    Social History Main Topics  . Smoking status: Former Smoker -- 0.75 packs/day for 25 years    Types: Cigarettes    Quit date: 10/23/1984  . Smokeless tobacco: Never Used  . Alcohol Use: 0.6 oz/week    1 Glasses of wine per week     Comment: social  . Drug Use: No  . Sexual Activity: No   Other Topics Concern  . Not on file   Social History Narrative  . No narrative on file    Family History  Problem Relation Age of Onset  . Colon cancer Neg Hx   . Heart attack Father 46     Review of Systems  Constitutional: Negative for fever and unexpected weight change.  HENT: Positive for congestion and postnasal drip. Negative for dental problem, ear pain, nosebleeds, rhinorrhea, sinus pressure, sneezing, sore throat and trouble swallowing.   Eyes: Positive for redness. Negative for itching.  Respiratory: Positive for cough. Negative for chest tightness, shortness of breath and wheezing.  Cardiovascular: Negative for palpitations and leg swelling.  Gastrointestinal: Negative for nausea and vomiting.  Genitourinary: Negative for dysuria.  Musculoskeletal: Negative for joint swelling.  Skin: Negative for rash.  Neurological: Negative for headaches.  Hematological: Does not bruise/bleed easily.  Psychiatric/Behavioral: Negative for dysphoric mood. The patient is not nervous/anxious.        Objective:   Physical Exam  Gen. Pleasant,elderly, well-nourished, in no distress, normal affect ENT - no lesions, no post nasal drip Neck: No JVD, no thyromegaly, no carotid bruits Lungs: no use of accessory muscles, no dullness to percussion, clear without rales or rhonchi  Cardiovascular: Rhythm regular, heart sounds  normal, no  murmurs or gallops, no peripheral edema Abdomen: soft and non-tender, no hepatosplenomegaly, BS normal. Musculoskeletal: No deformities, no cyanosis or clubbing Neuro:  alert, non focal       Assessment & Plan:

## 2014-01-15 DIAGNOSIS — F4322 Adjustment disorder with anxiety: Secondary | ICD-10-CM | POA: Diagnosis not present

## 2014-01-29 DIAGNOSIS — F411 Generalized anxiety disorder: Secondary | ICD-10-CM | POA: Diagnosis not present

## 2014-02-10 DIAGNOSIS — M79609 Pain in unspecified limb: Secondary | ICD-10-CM | POA: Diagnosis not present

## 2014-02-10 DIAGNOSIS — B351 Tinea unguium: Secondary | ICD-10-CM | POA: Diagnosis not present

## 2014-02-12 ENCOUNTER — Ambulatory Visit (INDEPENDENT_AMBULATORY_CARE_PROVIDER_SITE_OTHER): Payer: Medicare Other | Admitting: Adult Health

## 2014-02-12 ENCOUNTER — Encounter: Payer: Self-pay | Admitting: Adult Health

## 2014-02-12 VITALS — BP 108/66 | HR 85 | Temp 98.1°F | Ht 68.0 in | Wt 202.8 lb

## 2014-02-12 DIAGNOSIS — R05 Cough: Secondary | ICD-10-CM

## 2014-02-12 DIAGNOSIS — R059 Cough, unspecified: Secondary | ICD-10-CM

## 2014-02-12 DIAGNOSIS — R053 Chronic cough: Secondary | ICD-10-CM

## 2014-02-12 NOTE — Patient Instructions (Signed)
Take Chlortrimeton 4mg  2 tabs At bedtime   Take Allegra 180mg  daily in am  Deslym 2 tsp Twice daily  For cough  NO mints Sugarless candy to help with cough and throat clearing along with sips of water.  Hold fish oil for 1 month then restart -place in freezer.  follow up Dr. Elsworth Soho  In 2 months and As needed   Please contact office for sooner follow up if symptoms do not improve or worsen or seek emergency care

## 2014-02-12 NOTE — Progress Notes (Signed)
   Subjective:    Patient ID: Andrew Vega, male    DOB: [birth year], 80 y.o.   MRN: [id number]  HPI 80 year old remote smoker presents for evaluation of chronic cough.  01/13/14 IOV  He moved from [place] to [place] about a year ago. He reports a deep cough minimally productive of clear phlegm for about a year. He reports postnasal drip which is worse in the mornings and was not relieved by Nasonex and Claritin. He has not tried any over-the-counter cough syrups. He was told by a cardiologist that he may have COPD and is worried about this. He denies wheezing, paroxysmal nocturnal dyspnea, orthopnea or pedal edema. He denies heartburn or difficulty swallowing   Quit smoking in 1986--smoked less than a pack a day age [age],  started again when 45 until 14 - about 6 pyrs.  CXR from 08/23/13 shows emphysema and old granulomatous disease.    02/12/2014 Follow up Cough  Returns for follow up for cough. Felt to have an upper airway cough .  Placed on GERD /AR/Cough regimen.  Deslym really helped. But ran out of it and did not restart.  Last ov , Spirometry ok, no evidence of COPD.  No chest pain , orthopnea, edema , fever, or discolored mucus.  CXR 10/2013 mild basilar atx.     Review of Systems Constitutional:   No  weight loss, night sweats,  Fevers, chills, fatigue, or  lassitude.  HEENT:   No headaches,  Difficulty swallowing,  Tooth/dental problems, or  Sore throat,                No sneezing, itching, ear ache, +nasal congestion, post nasal drip,   CV:  No chest pain,  Orthopnea, PND, swelling in lower extremities, anasarca, dizziness, palpitations, syncope.   GI  No heartburn, indigestion, abdominal pain, nausea, vomiting, diarrhea, change in bowel habits, loss of appetite, bloody stools.   Resp:  No chest wall deformity  Skin: no rash or lesions.  GU: no dysuria, change in color of urine, no urgency or frequency.  No flank pain, no hematuria   MS:  No joint pain or  swelling.  No decreased range of motion.  No back pain.  Psych:  No change in mood or affect. No depression or anxiety.  No memory loss.         Objective:   Physical Exam GEN: A/Ox3; pleasant , NAD, elderly   HEENT:  [place]/AT,  EACs-clear, TMs-wnl, NOSE-clear, THROAT-clear, no lesions, no postnasal drip or exudate noted.   NECK:  Supple w/ fair ROM; no JVD; normal carotid impulses w/o bruits; no thyromegaly or nodules palpated; no lymphadenopathy.  RESP  Clear  P & A; w/o, wheezes/ rales/ or rhonchi.no accessory muscle use, no dullness to percussion  CARD:  RRR, no m/r/g  , no peripheral edema, pulses intact, no cyanosis or clubbing.  GI:   Soft & nt; nml bowel sounds; no organomegaly or masses detected.  Musco: Warm bil, no deformities or joint swelling noted.   Neuro: alert, no focal deficits noted.    Skin: Warm, no lesions or rashes         Assessment & Plan:

## 2014-02-13 NOTE — Assessment & Plan Note (Addendum)
Upper airway cough  Improving with trigger control  Plan  Take Chlortrimeton 4mg  2 tabs At bedtime   Take Allegra 180mg  daily in am  Deslym 2 tsp Twice daily  For cough  NO mints Sugarless candy to help with cough and throat clearing along with sips of water.  Hold fish oil for 1 month then restart -place in freezer.  follow up Dr. Elsworth Soho  In 2 months and As needed   Please contact office for sooner follow up if symptoms do not improve or worsen or seek emergency care

## 2014-02-16 DIAGNOSIS — F411 Generalized anxiety disorder: Secondary | ICD-10-CM | POA: Diagnosis not present

## 2014-02-24 ENCOUNTER — Encounter: Payer: Self-pay | Admitting: Gastroenterology

## 2014-02-24 ENCOUNTER — Ambulatory Visit (INDEPENDENT_AMBULATORY_CARE_PROVIDER_SITE_OTHER): Payer: Medicare Other | Admitting: Gastroenterology

## 2014-02-24 VITALS — BP 128/78 | HR 78 | Ht 68.0 in | Wt 198.2 lb

## 2014-02-24 DIAGNOSIS — R159 Full incontinence of feces: Secondary | ICD-10-CM | POA: Diagnosis not present

## 2014-02-24 NOTE — Patient Instructions (Addendum)
Continue the daily citrucel and 1-2 times per day kegle excercises. Call with any questions or concerns.

## 2014-02-24 NOTE — Progress Notes (Signed)
HPI: This is a  very pleasant 80 year old man whom I last saw about 2 months ago. At that time recommended he bulk his stool with fiber supplements and start Kegel exercises to help his intermittent fecal incontinence.  Has been doing kegles and fiber supplements.He has noticed clear improvement in his bowel habits. Bowel movements every 1-2 days.  No soilage in underpants at all.  kegles about twice per day.  Started citrucel.   Past Medical History  Diagnosis Date  . Anxiety   . Hyperlipidemia   . Abdominal aneurysm without mention of rupture   . Insomnia, unspecified   . Colon polyps   . Arthritis   . Diverticulosis of colon (without mention of hemorrhage)   . COPD (chronic obstructive pulmonary disease)   . Inguinal hernia   . Cancer     skin cancer removal  . Anginal pain     recent hospitalization  . H/O hiatal hernia     Past Surgical History  Procedure Laterality Date  . Vasectomy  1962  . Cholecystectomy  1962  . Colon surgery  2007  . Bilateral shoulder repair    . Nasal septum surgery  2011  . Hernia repair    . Abdominal aortic endovascular stent graft N/A 10/29/2013    Procedure: ABDOMINAL AORTIC ENDOVASCULAR STENT GRAFT;  Surgeon: [name], MD;  Location: [place];  Service: Vascular;  Laterality: N/A;    Current Outpatient Prescriptions  Medication Sig Dispense Refill  . aspirin 81 MG tablet Take 81 mg by mouth daily.      . B Complex-C (SUPER B COMPLEX PO) Take by mouth daily.      . chlorpheniramine (CHLOR-TRIMETON) 4 MG tablet 1-2 daily as needed      . Cholecalciferol (VITAMIN D) 2000 UNITS CAPS Take by mouth daily.      . citalopram (CELEXA) 40 MG tablet TAKE 1 TABLET BY MOUTH EVERY DAY  30 tablet  5  . dextromethorphan (DELSYM) 30 MG/5ML liquid 2 tsp by mouth twice daily as needed for cough      . Glucosamine-Chondroit-Vit C-Mn (GLUCOSAMINE CHONDR 1500 COMPLX PO) Take 1 capsule by mouth daily.      Marland Kitchen LORazepam (ATIVAN) 1 MG tablet Take one tablet by  mouth at bedtime and take 1/2 tablet by mouth during the day as needed  45 tablet  0  . Melatonin 3 MG TABS Take 3 tablets by mouth at bedtime.       . mometasone (NASONEX) 50 MCG/ACT nasal spray Place 2 sprays into the nose daily.  17 g  12  . Multiple Vitamin (MULTIVITAMIN) tablet Take 1 tablet by mouth daily.      . niacin 100 MG tablet Take 250 mg by mouth daily with breakfast.       . Omega-3 Fatty Acids (FISH OIL) 1000 MG CAPS Take 1 capsule by mouth daily.      . simvastatin (ZOCOR) 40 MG tablet Take one tablet once a day at bedtime for cholesterol supplement  90 tablet  3  . vitamin E 400 UNIT capsule Take 400 Units by mouth daily.       No current facility-administered medications for this visit.    Allergies as of 02/24/2014 - Review Complete 02/24/2014  Allergen Reaction Noted  . Celebrex [celecoxib]  07/10/2013  . Codeine  04/14/2013    Family History  Problem Relation Age of Onset  . Colon cancer Neg Hx   . Heart attack Father 65  History   Social History  . Marital Status: Married    Spouse Name: N/A    Number of Children: N/A  . Years of Education: N/A   Occupational History  . retired    Social History Main Topics  . Smoking status: Former Smoker -- 0.75 packs/day for 25 years    Types: Cigarettes    Quit date: 10/23/1984  . Smokeless tobacco: Never Used  . Alcohol Use: 0.6 oz/week    1 Glasses of wine per week     Comment: social  . Drug Use: No  . Sexual Activity: No   Other Topics Concern  . Not on file   Social History Narrative  . No narrative on file      Physical Exam: BP 128/78  Pulse 78  Ht 5\' 8"  (1.727 m)  Wt 198 lb 3.2 oz (89.903 kg)  BMI 30.14 kg/m2 Constitutional: generally well-appearing Psychiatric: alert and oriented x3 Abdomen: soft, nontender, nondistended, no obvious ascites, no peritoneal signs, normal bowel sounds     Assessment and plan: 80 y.o. male with improved fecal incontinence  He will continue daily  fiber supplements and twice daily Kegel exercises. These haven't significantly helped his fecal incontinence.

## 2014-03-02 ENCOUNTER — Emergency Department (HOSPITAL_COMMUNITY): Payer: Medicare Other

## 2014-03-02 ENCOUNTER — Emergency Department (HOSPITAL_COMMUNITY)
Admission: EM | Admit: 2014-03-02 | Discharge: 2014-03-02 | Disposition: A | Discharge status: Home / Self Care | Payer: Medicare Other | Attending: Emergency Medicine | Admitting: Emergency Medicine

## 2014-03-02 ENCOUNTER — Encounter (HOSPITAL_COMMUNITY): Payer: Self-pay | Admitting: Emergency Medicine

## 2014-03-02 DIAGNOSIS — J449 Chronic obstructive pulmonary disease, unspecified: Secondary | ICD-10-CM | POA: Insufficient documentation

## 2014-03-02 DIAGNOSIS — IMO0002 Reserved for concepts with insufficient information to code with codable children: Secondary | ICD-10-CM | POA: Diagnosis not present

## 2014-03-02 DIAGNOSIS — Z860100 Personal history of colon polyps, unspecified: Secondary | ICD-10-CM | POA: Insufficient documentation

## 2014-03-02 DIAGNOSIS — Z85828 Personal history of other malignant neoplasm of skin: Secondary | ICD-10-CM | POA: Diagnosis not present

## 2014-03-02 DIAGNOSIS — E785 Hyperlipidemia, unspecified: Secondary | ICD-10-CM | POA: Diagnosis not present

## 2014-03-02 DIAGNOSIS — Z8719 Personal history of other diseases of the digestive system: Secondary | ICD-10-CM | POA: Insufficient documentation

## 2014-03-02 DIAGNOSIS — S0181XA Laceration without foreign body of other part of head, initial encounter: Secondary | ICD-10-CM

## 2014-03-02 DIAGNOSIS — S0083XA Contusion of other part of head, initial encounter: Secondary | ICD-10-CM | POA: Diagnosis not present

## 2014-03-02 DIAGNOSIS — Z87891 Personal history of nicotine dependence: Secondary | ICD-10-CM | POA: Diagnosis not present

## 2014-03-02 DIAGNOSIS — I209 Angina pectoris, unspecified: Secondary | ICD-10-CM | POA: Insufficient documentation

## 2014-03-02 DIAGNOSIS — F411 Generalized anxiety disorder: Secondary | ICD-10-CM | POA: Insufficient documentation

## 2014-03-02 DIAGNOSIS — G4700 Insomnia, unspecified: Secondary | ICD-10-CM | POA: Diagnosis not present

## 2014-03-02 DIAGNOSIS — S0100XA Unspecified open wound of scalp, initial encounter: Secondary | ICD-10-CM | POA: Diagnosis not present

## 2014-03-02 DIAGNOSIS — I629 Nontraumatic intracranial hemorrhage, unspecified: Secondary | ICD-10-CM | POA: Diagnosis not present

## 2014-03-02 DIAGNOSIS — S2239XA Fracture of one rib, unspecified side, initial encounter for closed fracture: Secondary | ICD-10-CM | POA: Insufficient documentation

## 2014-03-02 DIAGNOSIS — S0990XA Unspecified injury of head, initial encounter: Secondary | ICD-10-CM | POA: Diagnosis not present

## 2014-03-02 DIAGNOSIS — Y929 Unspecified place or not applicable: Secondary | ICD-10-CM | POA: Insufficient documentation

## 2014-03-02 DIAGNOSIS — S01409A Unspecified open wound of unspecified cheek and temporomandibular area, initial encounter: Secondary | ICD-10-CM | POA: Diagnosis not present

## 2014-03-02 DIAGNOSIS — S1093XA Contusion of unspecified part of neck, initial encounter: Secondary | ICD-10-CM

## 2014-03-02 DIAGNOSIS — S0993XA Unspecified injury of face, initial encounter: Secondary | ICD-10-CM | POA: Diagnosis not present

## 2014-03-02 DIAGNOSIS — Z7982 Long term (current) use of aspirin: Secondary | ICD-10-CM | POA: Diagnosis not present

## 2014-03-02 DIAGNOSIS — T148XXA Other injury of unspecified body region, initial encounter: Secondary | ICD-10-CM | POA: Diagnosis not present

## 2014-03-02 DIAGNOSIS — M129 Arthropathy, unspecified: Secondary | ICD-10-CM | POA: Diagnosis not present

## 2014-03-02 DIAGNOSIS — S0003XA Contusion of scalp, initial encounter: Secondary | ICD-10-CM | POA: Insufficient documentation

## 2014-03-02 DIAGNOSIS — W1809XA Striking against other object with subsequent fall, initial encounter: Secondary | ICD-10-CM | POA: Insufficient documentation

## 2014-03-02 DIAGNOSIS — Z79899 Other long term (current) drug therapy: Secondary | ICD-10-CM | POA: Insufficient documentation

## 2014-03-02 DIAGNOSIS — Y9301 Activity, walking, marching and hiking: Secondary | ICD-10-CM | POA: Insufficient documentation

## 2014-03-02 DIAGNOSIS — J4489 Other specified chronic obstructive pulmonary disease: Secondary | ICD-10-CM | POA: Insufficient documentation

## 2014-03-02 DIAGNOSIS — S0180XA Unspecified open wound of other part of head, initial encounter: Secondary | ICD-10-CM | POA: Insufficient documentation

## 2014-03-02 DIAGNOSIS — S62509A Fracture of unspecified phalanx of unspecified thumb, initial encounter for closed fracture: Secondary | ICD-10-CM

## 2014-03-02 MED ORDER — HYDROCODONE-ACETAMINOPHEN 5-325 MG PO TABS: 1.0000 | ORAL_TABLET | Freq: Four times a day (QID) | ORAL | Status: DC | PRN

## 2014-03-02 NOTE — Discharge Instructions (Signed)
Return here as needed.  Sutures out in 5 days.  Use ice on the area around your eye.

## 2014-03-02 NOTE — ED Notes (Signed)
Pt back from radiology 

## 2014-03-02 NOTE — ED Notes (Signed)
PT states he was carrying groceries when he tripped, striking left side of face. Pt now has large hematoma on left eye and laceration under left eye. Bleeding controlled. Neuro intact. AO x4. Family at bedside.

## 2014-03-02 NOTE — ED Notes (Signed)
Dr.Lockwood at bedside  

## 2014-03-02 NOTE — ED Notes (Signed)
suture cart at bedside. 

## 2014-03-02 NOTE — Progress Notes (Signed)
Orthopedic Tech Progress Note Patient Details:  Andrew Vega 1925-11-20 [id number]  Ortho Devices Type of Ortho Device: Ace wrap;Thumb spica splint Splint Material: Fiberglass Ortho Device/Splint Location: RUE Ortho Device/Splint Interventions: Ordered;Application   Braulio Bosch 03/02/2014, 9:29 PM

## 2014-03-02 NOTE — ED Notes (Signed)
Pt in radiology - introduced self to pts family

## 2014-03-02 NOTE — ED Notes (Signed)
Dr. Vanita Panda MD at bedside.

## 2014-03-02 NOTE — ED Notes (Addendum)
Per EMS: Pt was walking when he tripped, striking left portion of head. Denies LOC. Pt has large hematoma above left, laceration under left with bleeding controlled. AO x4. Neuro intact; denies dizziness, blurred vision or double vision. PERRLA.  Denies taking blood thinners. 140/80. 70 reg. 18 RR. 100% RA.

## 2014-03-02 NOTE — ED Provider Notes (Addendum)
CSN: [id number]     Arrival date & time 03/02/14  1752 History   First MD Initiated Contact with Patient 03/02/14 1806     Chief Complaint  Patient presents with  . Fall  . Head Injury     (Consider location/radiation/quality/duration/timing/severity/associated sxs/prior Treatment) HPI Patient presents to the emergency department following a fall that occurred just prior to arrival.  Patient, states he was carrying some groceries when the grocery shifted causing him to fall forward.  The patient, states, that he struck his face and left side against the ground.  Patient has lacerations above the left eye and just below his left eye.  The patient, states, that nothing seems to make his condition, better or worse.  The patient, states, that he is having pain in the right thumb as well.  He is also having left rib pain laterally in the lower ribs.  Patient denies nausea, vomiting, headache, blurred vision, weakness, dizziness, loss of consciousness, chest pain, shortness of breath, neck pain, or back pain.  The patient, states, that he did not take any medications prior to arrival Past Medical History  Diagnosis Date  . Anxiety   . Hyperlipidemia   . Abdominal aneurysm without mention of rupture   . Insomnia, unspecified   . Colon polyps   . Arthritis   . Diverticulosis of colon (without mention of hemorrhage)   . COPD (chronic obstructive pulmonary disease)   . Inguinal hernia   . Cancer     skin cancer removal  . Anginal pain     recent hospitalization  . H/O hiatal hernia    Past Surgical History  Procedure Laterality Date  . Vasectomy  1962  . Cholecystectomy  1962  . Colon surgery  2007  . Bilateral shoulder repair    . Nasal septum surgery  2011  . Hernia repair    . Abdominal aortic endovascular stent graft N/A 10/29/2013    Procedure: ABDOMINAL AORTIC ENDOVASCULAR STENT GRAFT;  Surgeon: [name], MD;  Location: [facility] OR;  Service: Vascular;  Laterality: N/A;   Family  History  Problem Relation Age of Onset  . Colon cancer Neg Hx   . Heart attack Father 56   History  Substance Use Topics  . Smoking status: Former Smoker -- 0.75 packs/day for 25 years    Types: Cigarettes    Quit date: 10/23/1984  . Smokeless tobacco: Never Used  . Alcohol Use: 0.6 oz/week    1 Glasses of wine per week     Comment: social    Review of Systems  All other systems negative except as documented in the HPI. All pertinent positives and negatives as reviewed in the HPI.  Allergies  Celebrex and Codeine  Home Medications   Prior to Admission medications   Medication Sig Start Date End Date Taking? Authorizing Provider  aspirin 81 MG tablet Take 81 mg by mouth daily.   Yes Historical Provider, MD  B Complex-C (SUPER B COMPLEX PO) Take 1 tablet by mouth daily.    Yes Historical Provider, MD  chlorpheniramine (CHLOR-TRIMETON) 4 MG tablet Take 4-8 mg by mouth every 6 (six) hours as needed for allergies.    Yes Historical Provider, MD  Cholecalciferol (VITAMIN D) 2000 UNITS CAPS Take 2,000 Units by mouth daily.    Yes Historical Provider, MD  citalopram (CELEXA) 40 MG tablet Take 40 mg by mouth daily.   Yes Historical Provider, MD  dextromethorphan (DELSYM) 30 MG/5ML liquid Take 60 mg by  mouth 2 (two) times daily as needed for cough.    Yes Historical Provider, MD  Glucosamine-Chondroit-Vit C-Mn (GLUCOSAMINE CHONDR 1500 COMPLX PO) Take 1 capsule by mouth daily.   Yes Historical Provider, MD  LORazepam (ATIVAN) 0.5 MG tablet Take 0.5-1 mg by mouth See admin instructions. Take 1mg  at bedtime, 0.5mg  as needed   Yes Historical Provider, MD  Melatonin 3 MG TABS Take 3 tablets by mouth at bedtime.    Yes Historical Provider, MD  Multiple Vitamin (MULTIVITAMIN) tablet Take 1 tablet by mouth daily.   Yes Historical Provider, MD  niacin 100 MG tablet Take 250 mg by mouth daily with breakfast.    Yes Historical Provider, MD  Omega-3 Fatty Acids (FISH OIL) 1000 MG CAPS Take 1 capsule  by mouth daily.   Yes Historical Provider, MD  OVER THE COUNTER MEDICATION Place 1 drop into both eyes every morning.   Yes Historical Provider, MD  simvastatin (ZOCOR) 40 MG tablet Take one tablet once a day at bedtime for cholesterol supplement 03/12/13  Yes Estill Dooms, MD  vitamin E 400 UNIT capsule Take 400 Units by mouth daily.   Yes Historical Provider, MD   BP 155/82  Pulse 68  Temp(Src) 98.3 F (36.8 C) (Oral)  Resp 16  Ht 5\' 8"  (1.727 m)  Wt 195 lb (88.451 kg)  BMI 29.66 kg/m2  SpO2 95% Physical Exam  Nursing note and vitals reviewed. Constitutional: He is oriented to person, place, and time. He appears well-developed and well-nourished. No distress.  HENT:  Head: Normocephalic. Head is with abrasion, with contusion and with laceration.    Mouth/Throat: Oropharynx is clear and moist.  Eyes: Pupils are equal, round, and reactive to light.  Neck: Normal range of motion. Neck supple.  Cardiovascular: Normal rate, regular rhythm and normal heart sounds.  Exam reveals no gallop and no friction rub.   No murmur heard. Pulmonary/Chest: Effort normal and breath sounds normal. No respiratory distress.  Neurological: He is alert and oriented to person, place, and time. He exhibits normal muscle tone. Coordination normal.  Skin: Skin is warm and dry. No rash noted. No erythema.    ED Course  Procedures (including critical care time) Labs Review Labs Reviewed - No data to display  Imaging Review Dg Ribs Unilateral W/chest Left  03/02/2014   CLINICAL DATA:  Fall  EXAM: LEFT RIBS AND CHEST - 3+ VIEW  COMPARISON:  Prior CT from 12/01/2013  FINDINGS: Cardiomegaly and noted. Mediastinal silhouette within normal limits. Tortuosity of the intrathoracic aorta noted.  Lungs are mildly hypoinflated with basilar atelectasis. No focal infiltrate, pulmonary edema, or pleural effusion.  Metallic BB overlies the lower left chest at the palpable area of pain. Subjacent seated BB, there is a  subtle cortical irregularity through the superior aspect of the left posterior eleventh rib, suspicious for acute rib fracture. No other acute osseous abnormality. Stent endograft noted within the intra-abdominal aorta.  IMPRESSION: 1. Acute minimally displaced fracture of the left posterior eleventh rib. 2. Shallow lung inflation with mild bibasilar atelectasis. 3. Cardiomegaly without pulmonary edema.   Electronically Signed   By: Jeannine Boga M.D.   On: 03/02/2014 20:04   Ct Head Wo Contrast  03/02/2014   CLINICAL DATA:  Status post fall. Without known loss of consciousness. Left-sided periorbital hematoma and laceration under the left orbit  EXAM: CT HEAD WITHOUT CONTRAST  CT MAXILLOFACIAL WITHOUT CONTRAST  CT CERVICAL SPINE WITHOUT CONTRAST  TECHNIQUE: Multidetector CT imaging of the head,  cervical spine, and maxillofacial structures were performed using the standard protocol without intravenous contrast. Multiplanar CT image reconstructions of the cervical spine and maxillofacial structures were also generated.  COMPARISON:  None.  FINDINGS: CT HEAD FINDINGS  There is a large cephalohematoma above the left orbit. There is mild diffuse cerebral and cerebellar atrophy with compensatory ventriculomegaly. There is no shift of the midline. There is no intracranial hemorrhage nor intracranial mass effect. There is no evidence of an evolving ischemic infarction. There are punctate basal ganglia calcifications. The cerebellum and brainstem exhibit normal density.  At bone window settings there is no evidence of an acute fracture of the calvarium. The cephalohematoma measures 6.6 cm transversely by 2.6 cm AP.  CT MAXILLOFACIAL FINDINGS  The bony orbits are intact. The zygomatic arches are intact. The globes are intact. The intraconal and extraconal soft tissues appear normal. There is a small amount of preseptal edema on the left associated with a large left supraorbital hematoma. The nasal bones and nasal  spine are intact. The pterygoid plates appear intact. There is mucoperiosteal thickening within the maxillary sinuses. The maxillary and ethmoid sinus walls appear normal. There are degenerative changes of the temporomandibular joints. No acute mandibular fractures are demonstrated.  CT CERVICAL SPINE FINDINGS  The cervical vertebral bodies are preserved in height. There is moderate to severe degenerative disc space narrowing at C 5- 6 and at C6-7 and at C7-T1. The prevertebral soft tissue spaces appear normal. There is moderate degenerative facet joint change at multiple levels. There is no evidence of an acute facet fracture or dislocation. The odontoid is intact and the lateral masses of C1 align normally with those of C2. There is degenerative change of the atlanto dens articulation. The bony ring at each cervical level is intact. The soft tissues of the neck appear normal where visualized.  IMPRESSION: 1. There is no evidence of an acute intracranial hemorrhage nor of an evolving ischemic event. There is mild diffuse age-appropriate cerebral and cerebellar atrophy. 2. There is no evidence of an acute skull fracture. There is are large left supraorbital cephalohematoma. 3. No acute facial bone fracture is demonstrated. There is mild preseptal edema on the left. The soft tissues of the left orbit appear intact. There is mucoperiosteal thickening within the ethmoid and maxillary sinuses likely reflecting inflammation. 4. There are degenerative disc changes of the mid and lower cervical spine with degenerative facet joint changes at all cervical levels. There is no evidence of an acute cervical spine fracture nor dislocation.   Electronically Signed   By: David  Martinique   On: 03/02/2014 19:37   Ct Cervical Spine Wo Contrast  03/02/2014   CLINICAL DATA:  Status post fall. Without known loss of consciousness. Left-sided periorbital hematoma and laceration under the left orbit  EXAM: CT HEAD WITHOUT CONTRAST  CT  MAXILLOFACIAL WITHOUT CONTRAST  CT CERVICAL SPINE WITHOUT CONTRAST  TECHNIQUE: Multidetector CT imaging of the head, cervical spine, and maxillofacial structures were performed using the standard protocol without intravenous contrast. Multiplanar CT image reconstructions of the cervical spine and maxillofacial structures were also generated.  COMPARISON:  None.  FINDINGS: CT HEAD FINDINGS  There is a large cephalohematoma above the left orbit. There is mild diffuse cerebral and cerebellar atrophy with compensatory ventriculomegaly. There is no shift of the midline. There is no intracranial hemorrhage nor intracranial mass effect. There is no evidence of an evolving ischemic infarction. There are punctate basal ganglia calcifications. The cerebellum and brainstem exhibit normal density.  At bone window settings there is no evidence of an acute fracture of the calvarium. The cephalohematoma measures 6.6 cm transversely by 2.6 cm AP.  CT MAXILLOFACIAL FINDINGS  The bony orbits are intact. The zygomatic arches are intact. The globes are intact. The intraconal and extraconal soft tissues appear normal. There is a small amount of preseptal edema on the left associated with a large left supraorbital hematoma. The nasal bones and nasal spine are intact. The pterygoid plates appear intact. There is mucoperiosteal thickening within the maxillary sinuses. The maxillary and ethmoid sinus walls appear normal. There are degenerative changes of the temporomandibular joints. No acute mandibular fractures are demonstrated.  CT CERVICAL SPINE FINDINGS  The cervical vertebral bodies are preserved in height. There is moderate to severe degenerative disc space narrowing at C 5- 6 and at C6-7 and at C7-T1. The prevertebral soft tissue spaces appear normal. There is moderate degenerative facet joint change at multiple levels. There is no evidence of an acute facet fracture or dislocation. The odontoid is intact and the lateral masses of  C1 align normally with those of C2. There is degenerative change of the atlanto dens articulation. The bony ring at each cervical level is intact. The soft tissues of the neck appear normal where visualized.  IMPRESSION: 1. There is no evidence of an acute intracranial hemorrhage nor of an evolving ischemic event. There is mild diffuse age-appropriate cerebral and cerebellar atrophy. 2. There is no evidence of an acute skull fracture. There is are large left supraorbital cephalohematoma. 3. No acute facial bone fracture is demonstrated. There is mild preseptal edema on the left. The soft tissues of the left orbit appear intact. There is mucoperiosteal thickening within the ethmoid and maxillary sinuses likely reflecting inflammation. 4. There are degenerative disc changes of the mid and lower cervical spine with degenerative facet joint changes at all cervical levels. There is no evidence of an acute cervical spine fracture nor dislocation.   Electronically Signed   By: David  Martinique   On: 03/02/2014 19:37   Dg Finger Thumb Right  03/02/2014   CLINICAL DATA:  Fall  EXAM: RIGHT THUMB 2+V  COMPARISON:  None.  FINDINGS: There is an acute comminuted predominantly transverse fracture through the base of the right first proximal phalanx. No intra-articular extension. No significant displacement. Diffuse soft tissue swelling present.  Diffuse osteopenia noted.  IMPRESSION: 1. Acute comminuted transverse fracture through the proximal shaft of the right first proximal phalanx. 2. Diffuse osteopenia.   Electronically Signed   By: Jeannine Boga M.D.   On: 03/02/2014 19:49   Ct Maxillofacial Wo Cm  03/02/2014   CLINICAL DATA:  Status post fall. Without known loss of consciousness. Left-sided periorbital hematoma and laceration under the left orbit  EXAM: CT HEAD WITHOUT CONTRAST  CT MAXILLOFACIAL WITHOUT CONTRAST  CT CERVICAL SPINE WITHOUT CONTRAST  TECHNIQUE: Multidetector CT imaging of the head, cervical spine,  and maxillofacial structures were performed using the standard protocol without intravenous contrast. Multiplanar CT image reconstructions of the cervical spine and maxillofacial structures were also generated.  COMPARISON:  None.  FINDINGS: CT HEAD FINDINGS  There is a large cephalohematoma above the left orbit. There is mild diffuse cerebral and cerebellar atrophy with compensatory ventriculomegaly. There is no shift of the midline. There is no intracranial hemorrhage nor intracranial mass effect. There is no evidence of an evolving ischemic infarction. There are punctate basal ganglia calcifications. The cerebellum and brainstem exhibit normal density.  At bone window settings  there is no evidence of an acute fracture of the calvarium. The cephalohematoma measures 6.6 cm transversely by 2.6 cm AP.  CT MAXILLOFACIAL FINDINGS  The bony orbits are intact. The zygomatic arches are intact. The globes are intact. The intraconal and extraconal soft tissues appear normal. There is a small amount of preseptal edema on the left associated with a large left supraorbital hematoma. The nasal bones and nasal spine are intact. The pterygoid plates appear intact. There is mucoperiosteal thickening within the maxillary sinuses. The maxillary and ethmoid sinus walls appear normal. There are degenerative changes of the temporomandibular joints. No acute mandibular fractures are demonstrated.  CT CERVICAL SPINE FINDINGS  The cervical vertebral bodies are preserved in height. There is moderate to severe degenerative disc space narrowing at C 5- 6 and at C6-7 and at C7-T1. The prevertebral soft tissue spaces appear normal. There is moderate degenerative facet joint change at multiple levels. There is no evidence of an acute facet fracture or dislocation. The odontoid is intact and the lateral masses of C1 align normally with those of C2. There is degenerative change of the atlanto dens articulation. The bony ring at each cervical  level is intact. The soft tissues of the neck appear normal where visualized.  IMPRESSION: 1. There is no evidence of an acute intracranial hemorrhage nor of an evolving ischemic event. There is mild diffuse age-appropriate cerebral and cerebellar atrophy. 2. There is no evidence of an acute skull fracture. There is are large left supraorbital cephalohematoma. 3. No acute facial bone fracture is demonstrated. There is mild preseptal edema on the left. The soft tissues of the left orbit appear intact. There is mucoperiosteal thickening within the ethmoid and maxillary sinuses likely reflecting inflammation. 4. There are degenerative disc changes of the mid and lower cervical spine with degenerative facet joint changes at all cervical levels. There is no evidence of an acute cervical spine fracture nor dislocation.   Electronically Signed   By: David  Martinique   On: 03/02/2014 19:37      LACERATION REPAIR Performed by: Brent General Authorized by: Resa Miner Dezzie Badilla Consent: Verbal consent obtained. Risks and benefits: risks, benefits and alternatives were discussed Consent given by: patient Patient identity confirmed: provided demographic data Prepped and Draped in normal sterile fashion Wound explored  Laceration Location: area just below Left eye  Laceration Length: 4cm  No Foreign Bodies seen or palpated  Anesthesia: local infiltration  Local anesthetic: lidocaine 2% wo epinephrine  Anesthetic total: 5 ml  Irrigation method: syringe Amount of cleaning: standard  Skin closure: 6-0 Prolene  Number of sutures: 6  Technique: simple interrupted  Patient tolerance: Patient tolerated the procedure well with no immediate complications.  Patient is advised return here for any worsening in his condition.  The patient has no significant findings noted on CT scan.  Patient has a fracture of his thumb, which he is referred to orthopedics, and a rib fracture noted.  The patient is  told to use ice and heat on the area that is sore Brent General, PA-C 03/04/14 [place], PA-C 03/17/14 0031

## 2014-03-05 DIAGNOSIS — IMO0002 Reserved for concepts with insufficient information to code with codable children: Secondary | ICD-10-CM | POA: Diagnosis not present

## 2014-03-05 NOTE — ED Provider Notes (Signed)
  This was a shared visit with a mid-level provided (NP or PA).  Throughout the patient's course I was available for consultation/collaboration.  I saw the ECG (if appropriate), relevant labs and studies - I agree with the interpretation.  On my exam the patient was in no distress.  He was awake, alert, appropriately interactive, moving all extremities. Patient had notable evidence of his fall, but  X-rays were largely reassuring aside from rib and thumb fractures. During a period of monitoring in the emergency department the patient had no decompensation, remained hemodynamically stable, and after suture repair is appropriate for discharge.      Carmin Muskrat, MD 03/05/14 [phone]

## 2014-03-09 ENCOUNTER — Encounter (HOSPITAL_COMMUNITY): Payer: Self-pay | Admitting: Emergency Medicine

## 2014-03-09 ENCOUNTER — Emergency Department (HOSPITAL_COMMUNITY)
Admission: EM | Admit: 2014-03-09 | Discharge: 2014-03-09 | Disposition: A | Discharge status: Home / Self Care | Payer: Medicare Other | Attending: Emergency Medicine | Admitting: Emergency Medicine

## 2014-03-09 ENCOUNTER — Telehealth: Payer: Self-pay

## 2014-03-09 ENCOUNTER — Telehealth: Payer: Self-pay | Admitting: Internal Medicine

## 2014-03-09 DIAGNOSIS — M129 Arthropathy, unspecified: Secondary | ICD-10-CM | POA: Diagnosis not present

## 2014-03-09 DIAGNOSIS — Z860100 Personal history of colon polyps, unspecified: Secondary | ICD-10-CM | POA: Insufficient documentation

## 2014-03-09 DIAGNOSIS — Z8719 Personal history of other diseases of the digestive system: Secondary | ICD-10-CM | POA: Insufficient documentation

## 2014-03-09 DIAGNOSIS — Z87891 Personal history of nicotine dependence: Secondary | ICD-10-CM | POA: Insufficient documentation

## 2014-03-09 DIAGNOSIS — I209 Angina pectoris, unspecified: Secondary | ICD-10-CM | POA: Diagnosis not present

## 2014-03-09 DIAGNOSIS — Z79899 Other long term (current) drug therapy: Secondary | ICD-10-CM | POA: Insufficient documentation

## 2014-03-09 DIAGNOSIS — Z7982 Long term (current) use of aspirin: Secondary | ICD-10-CM | POA: Insufficient documentation

## 2014-03-09 DIAGNOSIS — F411 Generalized anxiety disorder: Secondary | ICD-10-CM | POA: Diagnosis not present

## 2014-03-09 DIAGNOSIS — Z85828 Personal history of other malignant neoplasm of skin: Secondary | ICD-10-CM | POA: Insufficient documentation

## 2014-03-09 DIAGNOSIS — J449 Chronic obstructive pulmonary disease, unspecified: Secondary | ICD-10-CM | POA: Diagnosis not present

## 2014-03-09 DIAGNOSIS — Z4802 Encounter for removal of sutures: Secondary | ICD-10-CM | POA: Diagnosis not present

## 2014-03-09 DIAGNOSIS — E785 Hyperlipidemia, unspecified: Secondary | ICD-10-CM | POA: Insufficient documentation

## 2014-03-09 DIAGNOSIS — J4489 Other specified chronic obstructive pulmonary disease: Secondary | ICD-10-CM | POA: Insufficient documentation

## 2014-03-09 MED ORDER — HYDROCODONE-ACETAMINOPHEN 5-325 MG PO TABS
1.0000 | ORAL_TABLET | Freq: Once | ORAL | Status: AC
  Administered 2014-03-09: 1 via ORAL
  Filled 2014-03-09: qty 1

## 2014-03-09 MED ORDER — LIDOCAINE HCL 2 % EX GEL
Freq: Once | CUTANEOUS | Status: AC
  Administered 2014-03-09: 20 via TOPICAL
  Filled 2014-03-09: qty 20

## 2014-03-09 NOTE — ED Notes (Signed)
Attempted to remove sutures below left eyelid. Pt unable to tolerate.

## 2014-03-09 NOTE — ED Notes (Signed)
Pt still unable to tolerate suture removal.

## 2014-03-09 NOTE — ED Notes (Signed)
Another attempt to remove sutures. Pt could not tolerate.

## 2014-03-09 NOTE — ED Notes (Signed)
Lidocaine jell applied to suture lines.

## 2014-03-09 NOTE — Telephone Encounter (Signed)
Called pt, apologized for the telephone problem.  We lost connect with our telephone.  When we final got service, we had lost all of our messages- Pt says things happen, he understood.....cdavis

## 2014-03-09 NOTE — Telephone Encounter (Signed)
Message left on VM from patient stating he had some stitches placed and they are irritating him. Patient would like to know if he can come in today and get stitches removed.  I returned called to patient and was informed by patient's wife that he was gone to the emergency room to have stitches removed.

## 2014-03-09 NOTE — ED Provider Notes (Signed)
CSN: [id number]     Arrival date & time 03/09/14  1015 History  This chart was scribed for non-physician practitioner, Debroah Baller, [facility] working with Veryl Speak, MD by Frederich Balding, ED scribe. This patient was seen in room TR08C/TR08C and the patient's care was started at 10:37 AM.   Chief Complaint  Patient presents with  . Suture / Staple Removal   The history is provided by the patient. No language interpreter was used.   HPI Comments: Andrew Vega is a 80 y.o. male who presents to the Emergency Department for suture removal. Pt had 6 sutures placed on his face 7 days ago. States there is mild itching around the area. Denies any pain or drainage from the area.   Past Medical History  Diagnosis Date  . Anxiety   . Hyperlipidemia   . Abdominal aneurysm without mention of rupture   . Insomnia, unspecified   . Colon polyps   . Arthritis   . Diverticulosis of colon (without mention of hemorrhage)   . COPD (chronic obstructive pulmonary disease)   . Inguinal hernia   . Cancer     skin cancer removal  . Anginal pain     recent hospitalization  . H/O hiatal hernia    Past Surgical History  Procedure Laterality Date  . Vasectomy  1962  . Cholecystectomy  1962  . Colon surgery  2007  . Bilateral shoulder repair    . Nasal septum surgery  2011  . Hernia repair    . Abdominal aortic endovascular stent graft N/A 10/29/2013    Procedure: ABDOMINAL AORTIC ENDOVASCULAR STENT GRAFT;  Surgeon: [name], MD;  Location: [facility] OR;  Service: Vascular;  Laterality: N/A;   Family History  Problem Relation Age of Onset  . Colon cancer Neg Hx   . Heart attack Father 40   History  Substance Use Topics  . Smoking status: Former Smoker -- 0.75 packs/day for 25 years    Types: Cigarettes    Quit date: 10/23/1984  . Smokeless tobacco: Never Used  . Alcohol Use: 0.6 oz/week    1 Glasses of wine per week     Comment: social    Review of Systems  Skin: Positive for wound (healing).   All other systems reviewed and are negative.  Allergies  Celebrex and Codeine  Home Medications   Prior to Admission medications   Medication Sig Start Date End Date Taking? Authorizing Provider  aspirin 81 MG tablet Take 81 mg by mouth daily.    Historical Provider, MD  B Complex-C (SUPER B COMPLEX PO) Take 1 tablet by mouth daily.     Historical Provider, MD  chlorpheniramine (CHLOR-TRIMETON) 4 MG tablet Take 4-8 mg by mouth every 6 (six) hours as needed for allergies.     Historical Provider, MD  Cholecalciferol (VITAMIN D) 2000 UNITS CAPS Take 2,000 Units by mouth daily.     Historical Provider, MD  citalopram (CELEXA) 40 MG tablet Take 40 mg by mouth daily.    Historical Provider, MD  dextromethorphan (DELSYM) 30 MG/5ML liquid Take 60 mg by mouth 2 (two) times daily as needed for cough.     Historical Provider, MD  Glucosamine-Chondroit-Vit C-Mn (GLUCOSAMINE CHONDR 1500 COMPLX PO) Take 1 capsule by mouth daily.    Historical Provider, MD  HYDROcodone-acetaminophen (NORCO/VICODIN) 5-325 MG per tablet Take 1 tablet by mouth every 6 (six) hours as needed for moderate pain. 03/02/14   Shelly, PA-C  LORazepam (ATIVAN) 0.5 MG  tablet Take 0.5-1 mg by mouth See admin instructions. Take 1mg  at bedtime, 0.5mg  as needed    Historical Provider, MD  Melatonin 3 MG TABS Take 3 tablets by mouth at bedtime.     Historical Provider, MD  Multiple Vitamin (MULTIVITAMIN) tablet Take 1 tablet by mouth daily.    Historical Provider, MD  niacin 100 MG tablet Take 250 mg by mouth daily with breakfast.     Historical Provider, MD  Omega-3 Fatty Acids (FISH OIL) 1000 MG CAPS Take 1 capsule by mouth daily.    Historical Provider, MD  OVER THE COUNTER MEDICATION Place 1 drop into both eyes every morning.    Historical Provider, MD  simvastatin (ZOCOR) 40 MG tablet Take one tablet once a day at bedtime for cholesterol supplement 03/12/13   Estill Dooms, MD  vitamin E 400 UNIT capsule Take 400 Units  by mouth daily.    Historical Provider, MD   BP 125/78  Pulse 86  Temp(Src) 98.2 F (36.8 C) (Oral)  Resp 16  Ht 5\' 8"  (1.727 m)  Wt 193 lb 11.2 oz (87.862 kg)  BMI 29.46 kg/m2  SpO2 95%  Physical Exam  Nursing note and vitals reviewed. Constitutional: He is oriented to person, place, and time. He appears well-developed and well-nourished. No distress.  HENT:  Head: Normocephalic and atraumatic.  Well healing lacerations above the eye and under the eye. No erythema or drainage. No signs of infection.   Eyes: EOM are normal.  Neck: Neck supple. No tracheal deviation present.  Cardiovascular: Normal rate.   Pulmonary/Chest: Effort normal. No respiratory distress.  Musculoskeletal: Normal range of motion.  Neurological: He is alert and oriented to person, place, and time.  Skin: Skin is warm and dry.  Psychiatric: He has a normal mood and affect. His behavior is normal.    ED Course  Procedures (including critical care time)  DIAGNOSTIC STUDIES: Oxygen Saturation is 95% on RA, adequate by my interpretation.    COORDINATION OF CARE: 10:39 AM-Discussed treatment plan which includes suture removal with pt at bedside and pt agreed to plan.   RN had difficulty trying to remove the sutures due to the severe pain the patient was having. I used lidocaine and infiltrated the area and removed the sutures.   MDM  80 y.o. male with healing wound here for suture removal. Stable for discharge without further screening indicated at this time.   I personally performed the services described in this documentation, which was scribed in my presence. The recorded information has been reviewed and is accurate.  Ashley Murrain, [place] 03/10/14 2328

## 2014-03-09 NOTE — ED Notes (Signed)
Janit Bern, NP and Secundino Ginger, RN at bedside to try again to remove sutures from patient.

## 2014-03-09 NOTE — ED Notes (Signed)
He is here to have sutures removed that were placed here last week. Denies any pain, states they are itching and seem to be healing well

## 2014-03-11 NOTE — ED Provider Notes (Signed)
Medical screening examination/treatment/procedure(s) were performed by non-physician practitioner and as supervising physician I was immediately available for consultation/collaboration.     Veryl Speak, MD 03/11/14 [phone]

## 2014-03-16 ENCOUNTER — Other Ambulatory Visit: Payer: Self-pay | Admitting: Internal Medicine

## 2014-03-17 NOTE — ED Provider Notes (Signed)
  This was a shared visit with a mid-level provided (NP or PA).  Throughout the patient's course I was available for consultation/collaboration.  I saw the ECG (if appropriate), relevant labs and studies - I agree with the interpretation.  On my exam the patient was in no distress.  Patient was surprisingly conversant, interactive given the gross facial trauma. Patient's wounds required laceration repair, which was well tolerated. With his thumb fracture he also required a thumb spica splint which was applied without complication.       Carmin Muskrat, MD 03/17/14 [phone]

## 2014-03-18 DIAGNOSIS — H1130 Conjunctival hemorrhage, unspecified eye: Secondary | ICD-10-CM | POA: Diagnosis not present

## 2014-03-30 ENCOUNTER — Encounter: Payer: Self-pay | Admitting: Internal Medicine

## 2014-03-30 ENCOUNTER — Ambulatory Visit (INDEPENDENT_AMBULATORY_CARE_PROVIDER_SITE_OTHER): Payer: Medicare Other | Admitting: Internal Medicine

## 2014-03-30 VITALS — BP 128/80 | HR 83 | Wt 194.4 lb

## 2014-03-30 DIAGNOSIS — R059 Cough, unspecified: Secondary | ICD-10-CM | POA: Diagnosis not present

## 2014-03-30 DIAGNOSIS — W19XXXA Unspecified fall, initial encounter: Secondary | ICD-10-CM

## 2014-03-30 DIAGNOSIS — R05 Cough: Secondary | ICD-10-CM

## 2014-03-30 DIAGNOSIS — IMO0002 Reserved for concepts with insufficient information to code with codable children: Secondary | ICD-10-CM

## 2014-03-30 DIAGNOSIS — S2242XA Multiple fractures of ribs, left side, initial encounter for closed fracture: Secondary | ICD-10-CM | POA: Insufficient documentation

## 2014-03-30 DIAGNOSIS — R296 Repeated falls: Secondary | ICD-10-CM

## 2014-03-30 DIAGNOSIS — S62619A Displaced fracture of proximal phalanx of unspecified finger, initial encounter for closed fracture: Secondary | ICD-10-CM | POA: Insufficient documentation

## 2014-03-30 DIAGNOSIS — R195 Other fecal abnormalities: Secondary | ICD-10-CM

## 2014-03-30 DIAGNOSIS — S2249XA Multiple fractures of ribs, unspecified side, initial encounter for closed fracture: Secondary | ICD-10-CM

## 2014-03-30 DIAGNOSIS — J309 Allergic rhinitis, unspecified: Secondary | ICD-10-CM | POA: Diagnosis not present

## 2014-03-30 DIAGNOSIS — F411 Generalized anxiety disorder: Secondary | ICD-10-CM | POA: Diagnosis not present

## 2014-03-30 DIAGNOSIS — R053 Chronic cough: Secondary | ICD-10-CM

## 2014-03-30 DIAGNOSIS — E785 Hyperlipidemia, unspecified: Secondary | ICD-10-CM | POA: Diagnosis not present

## 2014-03-30 NOTE — Patient Instructions (Signed)
Keep your medicines the same.   Keep your follow ups with Dr. Elsworth Soho and Dr. Ardis Hughs, as well as orthopedics.  Try to keep up with reading, crossword puzzles and socialization.  These will help keep your mind sharp.

## 2014-03-30 NOTE — Progress Notes (Signed)
Patient ID: Andrew Vega, male   DOB: 10/15/1926, 80 y.o.   MRN: [id number]   Location:  [facility]  Code Status: full code  Allergies  Allergen Reactions  . Celebrex [Celecoxib] Other (See Comments)    Raised B/P   . Codeine Other (See Comments)    Severe stomach pain     Chief Complaint  Patient presents with  . Medical Management of Chronic Issues    3 month follow-up, patient is fasting today if labs due     HPI: Patient is a 80 y.o. white male seen in the office today for medical mgt of chronic diseases.    01/13/14:  Saw GI Dr. Ardis Hughs office about his possible loose sphincter and rectal seapage.  Fiber supplement also recommended by them along with kegel exercises.    01/13/14:  Saw Pulmonary Dr. Elsworth Soho about possible copd--per PFTs he does NOT have COPD.  They started him on delsym cough syrup bid and chlortrimeton.  If this doesn't help, to tx GERD next.  02/12/14:  Saw pulmonary again.  Allegra added back in am with chlortrimeton.  Appears the allegra did not get added back to his med list.  See Dr. Elsworth Soho in 2 mos.    03/02/14:  Was carrying a gallon of oj and gallon of milk in the other--walking down hall in [place] on carpet.  Suddenly went forward and landed on his face with several lacerations of his left eye.  Fall with acute comminuted transverse fx of proximal shaft of right first proximal phalanx.  Has diffuse osteopenia.    03/09/14:  Got stitches removed at ED.  It was exquisitely painful.    Has appt Friday with orthopedics.  Left ribs also remain painful where they were fxed during fall.  When takes deep breath early in am when lying down, this still hurts.  Used 1/2 tablet of hydrocodone two days ago, but none since.    Still having some chronic cough.    Rectal seapage gone and loose stools improved with fiber supplement (also on the antihistamine which could have some constipating effect).   Has also been doing the  kegels with some help.    Says frequently just taking 1mg  of lorazepam at night.  Is aware of side effects of this medication as reviewed with him.  Admits to less socialization, more difficulty with word-finding. Is doing some phone volunteer work.  Has been reading a little in the past week.    Review of Systems:  ROS  Past Medical History  Diagnosis Date  . Anxiety   . Hyperlipidemia   . Abdominal aneurysm without mention of rupture   . Insomnia, unspecified   . Colon polyps   . Arthritis   . Diverticulosis of colon (without mention of hemorrhage)   . COPD (chronic obstructive pulmonary disease)   . Inguinal hernia   . Cancer     skin cancer removal  . Anginal pain     recent hospitalization  . H/O hiatal hernia     Past Surgical History  Procedure Laterality Date  . Vasectomy  1962  . Cholecystectomy  1962  . Colon surgery  2007  . Bilateral shoulder repair    . Nasal septum surgery  2011  . Hernia repair    . Abdominal aortic endovascular stent graft N/A 10/29/2013    Procedure: ABDOMINAL AORTIC ENDOVASCULAR STENT GRAFT;  Surgeon: [name], MD;  Location: [place];  Service:  Vascular;  Laterality: N/A;    Social History:   reports that he quit smoking about 29 years ago. His smoking use included Cigarettes. He has a 18.75 pack-year smoking history. He has never used smokeless tobacco. He reports that he drinks about .6 ounces of alcohol per week. He reports that he does not use illicit drugs.  Family History  Problem Relation Age of Onset  . Colon cancer Neg Hx   . Heart attack Father 37    Medications: Patient's Medications  New Prescriptions   No medications on file  Previous Medications   ASPIRIN 81 MG TABLET    Take 81 mg by mouth daily.   B COMPLEX-C (SUPER B COMPLEX PO)    Take 1 tablet by mouth daily.    CHLORPHENIRAMINE (CHLOR-TRIMETON) 4 MG TABLET    Take 4-8 mg by mouth every 6 (six) hours as needed for allergies.    CHOLECALCIFEROL (VITAMIN D)  2000 UNITS CAPS    Take 2,000 Units by mouth daily.    CITALOPRAM (CELEXA) 40 MG TABLET    Take 40 mg by mouth daily.   DEXTROMETHORPHAN (DELSYM) 30 MG/5ML LIQUID    Take 60 mg by mouth 2 (two) times daily as needed for cough.    GLUCOSAMINE-CHONDROIT-VIT C-MN (GLUCOSAMINE CHONDR 1500 COMPLX PO)    Take 1 capsule by mouth daily.   HYDROCODONE-ACETAMINOPHEN (NORCO/VICODIN) 5-325 MG PER TABLET    Take 1 tablet by mouth every 6 (six) hours as needed for moderate pain.   LORAZEPAM (ATIVAN) 0.5 MG TABLET    Take 0.5-1 mg by mouth See admin instructions. Take 1mg  at bedtime, 0.5mg  as needed   MELATONIN 3 MG TABS    Take 3 tablets by mouth at bedtime.    MULTIPLE VITAMIN (MULTIVITAMIN) TABLET    Take 1 tablet by mouth daily.   NIACIN 100 MG TABLET    Take 250 mg by mouth daily with breakfast.    OMEGA-3 FATTY ACIDS (FISH OIL) 1000 MG CAPS    Take 1 capsule by mouth daily.   OVER THE COUNTER MEDICATION    Place 1 drop into both eyes every morning.   SIMVASTATIN (ZOCOR) 40 MG TABLET    TAKE 1 TABLET BY MOUTH AT BEDTIME FOR CHOLESTEROL   VITAMIN E 400 UNIT CAPSULE    Take 400 Units by mouth daily.  Modified Medications   No medications on file  Discontinued Medications   No medications on file     Physical Exam: Filed Vitals:   03/30/14 0827  BP: 128/80  Pulse: 83  Weight: 194 lb 6.4 oz (88.179 kg)  SpO2: 91%  Physical Exam  Labs reviewed: Basic Metabolic Panel:  Recent Labs  04/11/13 0916  10/21/13 1524 10/29/13 1135 10/30/13 0630  NA 143  < > 140 143 140  K 4.2  < > 4.2 4.0 3.7  CL 104  < > 105 108 103  CO2 26  < > 21 24 22   GLUCOSE 101*  < > 82 98 107*  BUN 17  < > 23 20 15   CREATININE 1.40*  < > 1.18 1.08 1.13  CALCIUM 9.3  < > 8.8 8.0* 8.2*  MG  --   --   --  2.0  --   TSH 1.680  --   --   --   --   < > = values in this interval not displayed. Liver Function Tests:  Recent Labs  04/11/13 0916 10/21/13 1524  AST 24 24  ALT 21 27  ALKPHOS 73 63  BILITOT 0.5 0.5    PROT 7.1 7.3  ALBUMIN  --  3.6  CBC:  Recent Labs  04/11/13 0916  10/21/13 1524 10/29/13 1135 10/30/13 0630  WBC 7.6  < > 7.0 5.6 7.9  NEUTROABS 4.5  --   --   --   --   HGB 14.5  < > 14.3 12.9* 11.6*  HCT 40.8  < > 41.0 36.1* 32.9*  MCV 90  < > 91.3 90.3 91.1  PLT  --   < > 153 122* 153  < > = values in this interval not displayed. Lipid Panel:  Recent Labs  04/11/13 0916  HDL 50  LDLCALC 86  TRIG 121  CHOLHDL 3.2   Assessment/Plan 1. Chronic cough -seems this is allergic rhinitis primarily, possibly some role of GERD, as well as it does not seem to be improving with allergy med and cough syrup -advised not to also take the allegra as he was under the impression from Dr. Elsworth Soho that this be stopped when the chloritrimeton be started -keep f/u with Dr. Elsworth Soho  2. Fall in elderly patient -reviewed with him that the lorazepam and allergy meds do not help with his fall risk and occasional confusion--he is aware but needs these at present  3. Hyperlipidemia -will f/u flp--has been one year since last eval  4. Allergic rhinitis -cont medications as per pulmonary with delsym and antihistamine  5. Loose stools -improved with citrucel (generic) and kegels plus use of the antihistamine may have   6. Closed fracture of proximal phalanx of right hand -in cast at present -keep f/u with orthopedics--pt to let me know who he is seeing so I can add it to his chart  7. Multiple rib fractures on left side -still having pain there primarily when lying flat or taking a deep breath--may take 6 mos or so to heal   Labs/tests ordered:   Orders Placed This Encounter  Procedures  . CBC With differential/Platelet  . Comprehensive metabolic panel  . Lipid panel   Next appt:  3 mos. with [facility]

## 2014-03-31 LAB — COMPREHENSIVE METABOLIC PANEL
ALT: 19 IU/L (ref 0–44)
AST: 25 IU/L (ref 0–40)
Albumin/Globulin Ratio: 1.6 (ref 1.1–2.5)
Albumin: 4.2 g/dL (ref 3.5–4.7)
Alkaline Phosphatase: 102 IU/L (ref 39–117)
BUN/Creatinine Ratio: 14 (ref 10–22)
BUN: 20 mg/dL (ref 8–27)
CO2: 25 mmol/L (ref 18–29)
Calcium: 9.2 mg/dL (ref 8.6–10.2)
Chloride: 105 mmol/L (ref 97–108)
Creatinine, Ser: 1.46 mg/dL — ABNORMAL HIGH (ref 0.76–1.27)
GFR calc Af Amer: 50 mL/min/{1.73_m2} — ABNORMAL LOW (ref >59)
GFR calc non Af Amer: 43 mL/min/{1.73_m2} — ABNORMAL LOW (ref >59)
Globulin, Total: 2.7 g/dL (ref 1.5–4.5)
Glucose: 98 mg/dL (ref 65–99)
Potassium: 4.1 mmol/L (ref 3.5–5.2)
Sodium: 142 mmol/L (ref 134–144)
Total Bilirubin: 0.4 mg/dL (ref 0.0–1.2)
Total Protein: 6.9 g/dL (ref 6.0–8.5)

## 2014-03-31 LAB — CBC WITH DIFFERENTIAL
Basophils Absolute: 0.1 10*3/uL (ref 0.0–0.2)
Basos: 1 %
Eos: 7 %
Eosinophils Absolute: 0.5 10*3/uL — ABNORMAL HIGH (ref 0.0–0.4)
HCT: 39.4 % (ref 37.5–51.0)
Hemoglobin: 13.4 g/dL (ref 12.6–17.7)
Immature Grans (Abs): 0 10*3/uL (ref 0.0–0.1)
Immature Granulocytes: 0 %
Lymphocytes Absolute: 1.7 10*3/uL (ref 0.7–3.1)
Lymphs: 23 %
MCH: 31.2 pg (ref 26.6–33.0)
MCHC: 34 g/dL (ref 31.5–35.7)
MCV: 92 fL (ref 79–97)
Monocytes Absolute: 0.7 10*3/uL (ref 0.1–0.9)
Monocytes: 10 %
Neutrophils Absolute: 4.3 10*3/uL (ref 1.4–7.0)
Neutrophils Relative %: 59 %
Platelets: 203 10*3/uL (ref 150–379)
RBC: 4.29 x10E6/uL (ref 4.14–5.80)
RDW: 14.3 % (ref 12.3–15.4)
WBC: 7.3 10*3/uL (ref 3.4–10.8)

## 2014-03-31 LAB — LIPID PANEL
Chol/HDL Ratio: 3.6 ratio units (ref 0.0–5.0)
Cholesterol, Total: 170 mg/dL (ref 100–199)
HDL: 47 mg/dL (ref >39)
LDL Calculated: 94 mg/dL (ref 0–99)
Triglycerides: 143 mg/dL (ref 0–149)
VLDL Cholesterol Cal: 29 mg/dL (ref 5–40)

## 2014-04-06 DIAGNOSIS — IMO0002 Reserved for concepts with insufficient information to code with codable children: Secondary | ICD-10-CM | POA: Diagnosis not present

## 2014-04-15 ENCOUNTER — Encounter: Payer: Self-pay | Admitting: Pulmonary Disease

## 2014-04-15 ENCOUNTER — Ambulatory Visit (INDEPENDENT_AMBULATORY_CARE_PROVIDER_SITE_OTHER): Payer: Medicare Other | Admitting: Pulmonary Disease

## 2014-04-15 VITALS — BP 158/78 | HR 109 | Temp 97.8°F | Ht 68.0 in | Wt 197.6 lb

## 2014-04-15 DIAGNOSIS — R059 Cough, unspecified: Secondary | ICD-10-CM | POA: Diagnosis not present

## 2014-04-15 DIAGNOSIS — R05 Cough: Secondary | ICD-10-CM | POA: Diagnosis not present

## 2014-04-15 DIAGNOSIS — F411 Generalized anxiety disorder: Secondary | ICD-10-CM | POA: Diagnosis not present

## 2014-04-15 DIAGNOSIS — R053 Chronic cough: Secondary | ICD-10-CM

## 2014-04-15 NOTE — Progress Notes (Signed)
   Subjective:    Patient ID: Andrew Vega, male    DOB: 07-05-1926, 80 y.o.   MRN: [id number]  HPI 80 year old remote smoker for FU of chronic cough.  01/13/14 IOV  He moved from [place] to [place] in 2014.  He reports a deep cough minimally productive of clear phlegm for about a year. He reports postnasal drip which is worse in the mornings and was not relieved by Nasonex and Claritin. Quit smoking in 1986--smoked less than a pack a day age [age], started again when 73 until 77 - about 34 pyrs.  CXR from 08/23/13 showed emphysema and old granulomatous disease. Spirometry ok  04/15/2014  Chief Complaint  Patient presents with  . Follow-up    Pt states his cough has mostly resolved, pt still clears throat. Pt states since his last OV he has fallen and has fx right ribs and injured right wrist. Denies CP/tightness.     Placed on GERD /AR/Cough regimen -could not get CPM, took benadryl instead Deslym really helped. Reviewed CT  -mild atx vs scarring bibasal   Review of Systems neg for any significant sore throat, dysphagia, itching, sneezing, nasal congestion or excess/ purulent secretions, fever, chills, sweats, unintended wt loss, pleuritic or exertional cp, hempoptysis, orthopnea pnd or change in chronic leg swelling. Also denies presyncope, palpitations, heartburn, abdominal pain, nausea, vomiting, diarrhea or change in bowel or urinary habits, dysuria,hematuria, rash, arthralgias, visual complaints, headache, numbness weakness or ataxia.     Objective:   Physical Exam  Gen. Pleasant, well-nourished, in no distress ENT - no lesions, no post nasal drip Neck: No JVD, no thyromegaly, no carotid bruits Lungs: no use of accessory muscles, no dullness to percussion, bibasal rales, no rhonchi  Cardiovascular: Rhythm regular, heart sounds  normal, no murmurs or gallops, no peripheral edema Musculoskeletal: No deformities, no cyanosis or clubbing        Assessment & Plan:

## 2014-04-15 NOTE — Patient Instructions (Signed)
STOP benadryl Take ZYRTEC daily instead Take delsym as needed for cough You may have early scarring at the bottom of your lungs

## 2014-04-16 NOTE — Assessment & Plan Note (Deleted)
STOP benadryl Take ZYRTEC daily instead Take delsym as needed for cough You may have early scarring at the bottom of your lungs - if cough persists , may consider HRCT in the future

## 2014-04-16 NOTE — Assessment & Plan Note (Signed)
STOP benadryl Take ZYRTEC daily instead Take delsym as needed for cough You may have early scarring at the bottom of your lungs - if cough persists , may consider HRCT in the future

## 2014-04-17 ENCOUNTER — Other Ambulatory Visit: Payer: Self-pay | Admitting: *Deleted

## 2014-04-17 ENCOUNTER — Other Ambulatory Visit: Payer: Self-pay | Admitting: Internal Medicine

## 2014-04-17 MED ORDER — LORAZEPAM 1 MG PO TABS: ORAL_TABLET | ORAL | Status: DC

## 2014-04-17 NOTE — Telephone Encounter (Signed)
Andrew Vega

## 2014-04-20 ENCOUNTER — Telehealth: Payer: Self-pay | Admitting: Pulmonary Disease

## 2014-04-20 NOTE — Telephone Encounter (Signed)
Error.  Was able to answer pt's question w/out a message being taken.  Satira Anis

## 2014-04-29 DIAGNOSIS — F411 Generalized anxiety disorder: Secondary | ICD-10-CM | POA: Diagnosis not present

## 2014-05-05 DIAGNOSIS — B351 Tinea unguium: Secondary | ICD-10-CM | POA: Diagnosis not present

## 2014-05-05 DIAGNOSIS — M79609 Pain in unspecified limb: Secondary | ICD-10-CM | POA: Diagnosis not present

## 2014-05-06 DIAGNOSIS — M19049 Primary osteoarthritis, unspecified hand: Secondary | ICD-10-CM | POA: Diagnosis not present

## 2014-05-19 ENCOUNTER — Other Ambulatory Visit: Payer: Medicare Other

## 2014-05-19 DIAGNOSIS — E785 Hyperlipidemia, unspecified: Secondary | ICD-10-CM | POA: Diagnosis not present

## 2014-05-20 LAB — LIPID PANEL
CHOL/HDL RATIO: 3.4 ratio (ref 0.0–5.0)
Cholesterol, Total: 161 mg/dL (ref 100–199)
HDL: 47 mg/dL (ref >39)
LDL Calculated: 87 mg/dL (ref 0–99)
Triglycerides: 135 mg/dL (ref 0–149)
VLDL Cholesterol Cal: 27 mg/dL (ref 5–40)

## 2014-05-20 LAB — CBC WITH DIFFERENTIAL
BASOS: 1 %
Basophils Absolute: 0.1 10*3/uL (ref 0.0–0.2)
Eos: 6 %
Eosinophils Absolute: 0.5 10*3/uL — ABNORMAL HIGH (ref 0.0–0.4)
HEMATOCRIT: 38.6 % (ref 37.5–51.0)
HEMOGLOBIN: 13.5 g/dL (ref 12.6–17.7)
IMMATURE GRANS (ABS): 0 10*3/uL (ref 0.0–0.1)
Immature Granulocytes: 0 %
LYMPHS: 22 %
Lymphocytes Absolute: 1.8 10*3/uL (ref 0.7–3.1)
MCH: 31.9 pg (ref 26.6–33.0)
MCHC: 35 g/dL (ref 31.5–35.7)
MCV: 91 fL (ref 79–97)
MONOCYTES: 11 %
Monocytes Absolute: 0.9 10*3/uL (ref 0.1–0.9)
NEUTROS ABS: 5 10*3/uL (ref 1.4–7.0)
NEUTROS PCT: 60 %
Platelets: 193 10*3/uL (ref 150–379)
RBC: 4.23 x10E6/uL (ref 4.14–5.80)
RDW: 13.9 % (ref 12.3–15.4)
WBC: 8.2 10*3/uL (ref 3.4–10.8)

## 2014-05-20 LAB — COMPREHENSIVE METABOLIC PANEL
A/G RATIO: 1.4 (ref 1.1–2.5)
ALK PHOS: 75 IU/L (ref 39–117)
ALT: 18 IU/L (ref 0–44)
AST: 21 IU/L (ref 0–40)
Albumin: 4 g/dL (ref 3.5–4.7)
BUN / CREAT RATIO: 13 (ref 10–22)
BUN: 19 mg/dL (ref 8–27)
CO2: 21 mmol/L (ref 18–29)
CREATININE: 1.41 mg/dL — AB (ref 0.76–1.27)
Calcium: 9.3 mg/dL (ref 8.6–10.2)
Chloride: 104 mmol/L (ref 97–108)
GFR calc Af Amer: 52 mL/min/{1.73_m2} — ABNORMAL LOW (ref >59)
GFR, EST NON AFRICAN AMERICAN: 45 mL/min/{1.73_m2} — AB (ref >59)
GLOBULIN, TOTAL: 2.8 g/dL (ref 1.5–4.5)
Glucose: 96 mg/dL (ref 65–99)
Potassium: 4.4 mmol/L (ref 3.5–5.2)
Sodium: 140 mmol/L (ref 134–144)
Total Bilirubin: 0.4 mg/dL (ref 0.0–1.2)
Total Protein: 6.8 g/dL (ref 6.0–8.5)

## 2014-05-21 ENCOUNTER — Encounter: Payer: Self-pay | Admitting: Internal Medicine

## 2014-05-21 ENCOUNTER — Ambulatory Visit (INDEPENDENT_AMBULATORY_CARE_PROVIDER_SITE_OTHER): Payer: Medicare Other | Admitting: Internal Medicine

## 2014-05-21 VITALS — BP 132/74 | HR 86 | Temp 97.7°F | Ht 68.0 in | Wt 198.6 lb

## 2014-05-21 DIAGNOSIS — J309 Allergic rhinitis, unspecified: Secondary | ICD-10-CM | POA: Diagnosis not present

## 2014-05-21 DIAGNOSIS — S62619S Displaced fracture of proximal phalanx of unspecified finger, sequela: Secondary | ICD-10-CM

## 2014-05-21 DIAGNOSIS — IMO0001 Reserved for inherently not codable concepts without codable children: Secondary | ICD-10-CM

## 2014-05-21 DIAGNOSIS — S42309S Unspecified fracture of shaft of humerus, unspecified arm, sequela: Secondary | ICD-10-CM

## 2014-05-21 DIAGNOSIS — W19XXXS Unspecified fall, sequela: Secondary | ICD-10-CM

## 2014-05-21 DIAGNOSIS — F3289 Other specified depressive episodes: Secondary | ICD-10-CM

## 2014-05-21 DIAGNOSIS — R059 Cough, unspecified: Secondary | ICD-10-CM

## 2014-05-21 DIAGNOSIS — Z5181 Encounter for therapeutic drug level monitoring: Secondary | ICD-10-CM

## 2014-05-21 DIAGNOSIS — R053 Chronic cough: Secondary | ICD-10-CM

## 2014-05-21 DIAGNOSIS — F329 Major depressive disorder, single episode, unspecified: Secondary | ICD-10-CM

## 2014-05-21 DIAGNOSIS — R05 Cough: Secondary | ICD-10-CM | POA: Diagnosis not present

## 2014-05-21 DIAGNOSIS — F32A Depression, unspecified: Secondary | ICD-10-CM

## 2014-05-21 DIAGNOSIS — E785 Hyperlipidemia, unspecified: Secondary | ICD-10-CM

## 2014-05-21 DIAGNOSIS — R151 Fecal smearing: Secondary | ICD-10-CM

## 2014-05-21 NOTE — Progress Notes (Signed)
Passed clock drawing 

## 2014-05-21 NOTE — Progress Notes (Signed)
Patient ID: Andrew Vega, male   DOB: Jul 23, 1926, 80 y.o.   MRN: [id number]   Location:  [facility]  Code Status:   Allergies  Allergen Reactions  . Celebrex [Celecoxib] Other (See Comments)    Raised B/P   . Codeine Other (See Comments)    Severe stomach pain     Chief Complaint  Patient presents with  . Annual Exam    physical with labs, discuss genetic test results and MMSE, fall screening neg.    HPI: Patient is a 80 y.o. white male seen in the office today for his annual exam.    Got 28/30 on MMSE today.  Missed points--one on recall and precise date today.    Hurt his thumb a second time.  As f/u with Dr. Grandville Silos next week.  Told him his arthritis settled in his basal joint.    Has f/u carotid doppler upcoming also.    Continues with chronic cough, but says it is much better.  Is primarily the postnasal drip.    Last cscope 4 years ago.  Review of Systems:  Review of Systems  Constitutional: Positive for malaise/fatigue. Negative for fever.  HENT: Positive for hearing loss. Negative for congestion.        Had hearing eval maybe three years ago--some loss noted  Eyes: Negative for blurred vision.       Had cataract surgery and wears glasses only for reading  Respiratory: Positive for cough. Negative for shortness of breath.   Cardiovascular: Negative for chest pain, palpitations and leg swelling.  Gastrointestinal: Positive for heartburn. Negative for abdominal pain, diarrhea, constipation, blood in stool and melena.       Rarely if has something he knows to avoid; had the rectal seepage, but this has improved with bulking up stool  Genitourinary: Negative for dysuria, urgency and frequency.  Musculoskeletal: Negative for falls, joint pain and myalgias.  Skin: Negative for rash.       Has dry skin  Neurological: Negative for weakness and headaches.  Psychiatric/Behavioral: Positive for memory loss. Negative for  depression. The patient has insomnia. The patient is not nervous/anxious.        Some word finding and missed 2 pts on mmse;  Not persistently in a morose or happy mood, does not feel anxious; still cannot get steady sleep--doesn't have option of sleeping in b/c wife's caregivers arrive     Past Medical History  Diagnosis Date  . Anxiety   . Hyperlipidemia   . Abdominal aneurysm without mention of rupture   . Insomnia, unspecified   . Colon polyps   . Arthritis   . Diverticulosis of colon (without mention of hemorrhage)   . COPD (chronic obstructive pulmonary disease)   . Inguinal hernia   . Cancer     skin cancer removal  . Anginal pain     recent hospitalization  . H/O hiatal hernia     Past Surgical History  Procedure Laterality Date  . Vasectomy  1962  . Cholecystectomy  1962  . Colon surgery  2007  . Bilateral shoulder repair    . Nasal septum surgery  2011  . Hernia repair    . Abdominal aortic endovascular stent graft N/A 10/29/2013    Procedure: ABDOMINAL AORTIC ENDOVASCULAR STENT GRAFT;  Surgeon: [name], MD;  Location: [facility] OR;  Service: Vascular;  Laterality: N/A;    Social History:   reports that he quit smoking about 29 years  ago. His smoking use included Cigarettes. He has a 18.75 pack-year smoking history. He has never used smokeless tobacco. He reports that he drinks about .6 ounces of alcohol per week. He reports that he does not use illicit drugs.  Family History  Problem Relation Age of Onset  . Colon cancer Neg Hx   . Heart attack Father 75    Medications: Patient's Medications  New Prescriptions   No medications on file  Previous Medications   ASPIRIN 81 MG TABLET    Take 81 mg by mouth daily.   B COMPLEX-C (SUPER B COMPLEX PO)    Take 1 tablet by mouth daily.    CHLORPHENIRAMINE (CHLOR-TRIMETON) 4 MG TABLET    Take 4-8 mg by mouth every 6 (six) hours as needed for allergies.    CHOLECALCIFEROL (VITAMIN D) 2000 UNITS CAPS    Take 2,000  Units by mouth daily.    CITALOPRAM (CELEXA) 40 MG TABLET    Take 40 mg by mouth daily.   DIPHENHYDRAMINE (BENADRYL) 25 MG CAPSULE    Take 25 mg by mouth every 6 (six) hours as needed.   GLUCOSAMINE-CHONDROIT-VIT C-MN (GLUCOSAMINE CHONDR 1500 COMPLX PO)    Take 1 capsule by mouth daily.   HYDROCODONE-ACETAMINOPHEN (NORCO/VICODIN) 5-325 MG PER TABLET    Take 1 tablet by mouth every 6 (six) hours as needed for moderate pain.   LORAZEPAM (ATIVAN) 1 MG TABLET    Take one tablet by mouth every night at bedtime and 1/2 tablet by mouth during the day as needed for anxiety   MELATONIN 3 MG TABS    Take 3 tablets by mouth at bedtime.    MULTIPLE VITAMIN (MULTIVITAMIN) TABLET    Take 1 tablet by mouth daily.   NIACIN 100 MG TABLET    Take 250 mg by mouth daily with breakfast.    OMEGA-3 FATTY ACIDS (FISH OIL) 1000 MG CAPS    Take 1 capsule by mouth daily.   OVER THE COUNTER MEDICATION    Place 1 drop into both eyes every morning.   SIMVASTATIN (ZOCOR) 40 MG TABLET    TAKE 1 TABLET BY MOUTH AT BEDTIME FOR CHOLESTEROL   VITAMIN E 400 UNIT CAPSULE    Take 400 Units by mouth daily.  Modified Medications   No medications on file  Discontinued Medications   DEXTROMETHORPHAN (DELSYM) 30 MG/5ML LIQUID    Take 60 mg by mouth 2 (two) times daily as needed for cough.    LORAZEPAM (ATIVAN) 0.5 MG TABLET    Take 0.5-1 mg by mouth See admin instructions. Take 1mg  at bedtime, 0.5mg  as needed     Physical Exam: Filed Vitals:   05/21/14 1344  BP: 132/74  Pulse: 86  Temp: 97.7 F (36.5 C)  TempSrc: Oral  Height: 5\' 8"  (1.727 m)  Weight: 198 lb 9.6 oz (90.084 kg)  SpO2: 98%  Physical Exam  Constitutional: He is oriented to person, place, and time. He appears well-developed and well-nourished. No distress.  HENT:  Head: Normocephalic and atraumatic.  Right Ear: External ear normal.  Left Ear: External ear normal.  Nose: Nose normal.  Mouth/Throat: Oropharynx is clear and moist.  Postnasal drip  Eyes:  Conjunctivae and EOM are normal. Pupils are equal, round, and reactive to light.  Neck: Normal range of motion. Neck supple. No JVD present. No thyromegaly present.  Cardiovascular: Normal rate, regular rhythm, normal heart sounds and intact distal pulses.   Pulmonary/Chest: Effort normal and breath sounds normal. No respiratory  distress.  Abdominal: Soft. Bowel sounds are normal. He exhibits no distension and no mass. There is no tenderness.  Genitourinary: Guaiac negative stool.  Rectal vault empty  Musculoskeletal: Normal range of motion. He exhibits no edema and no tenderness.  Lymphadenopathy:    He has no cervical adenopathy.  Neurological: He is alert and oriented to person, place, and time. He has normal reflexes.  Skin: Skin is warm and dry.  Psychiatric: He has a normal mood and affect. His behavior is normal. Judgment and thought content normal.    Labs reviewed: Basic Metabolic Panel:  Recent Labs  10/21/13 1524 10/29/13 1135 10/30/13 0630 03/30/14 0920 05/19/14 0927  NA 140 143 140 142 140  K 4.2 4.0 3.7 4.1 4.4  CL 105 108 103 105 104  CO2 21 24 22 25 21   GLUCOSE 82 98 107* 98 96  BUN 23 20 15 20 19   CREATININE 1.18 1.08 1.13 1.46* 1.41*  CALCIUM 8.8 8.0* 8.2* 9.2 9.3  MG  --  2.0  --   --   --    Liver Function Tests:  Recent Labs  10/21/13 1524 03/30/14 0920 05/19/14 0927  AST 24 25 21   ALT 27 19 18   ALKPHOS 63 102 75  BILITOT 0.5 0.4 0.4  PROT 7.3 6.9 6.8  ALBUMIN 3.6  --   --    No results found for this basename: LIPASE, AMYLASE,  in the last 8760 hours No results found for this basename: AMMONIA,  in the last 8760 hours CBC:  Recent Labs  10/30/13 0630 03/30/14 0920 05/19/14 0927  WBC 7.9 7.3 8.2  NEUTROABS  --  4.3 5.0  HGB 11.6* 13.4 13.5  HCT 32.9* 39.4 38.6  MCV 91.1 92 91  PLT 153 203 193   Lipid Panel:  Recent Labs  03/30/14 0920 05/19/14 0927  HDL 47 47  LDLCALC 94 87  TRIG 143 135  CHOLHDL 3.6 3.4    Assessment/Plan 1. Closed fracture of proximal phalanx of right hand, sequela -keep f/u with Dr. Grandville Silos as planned, cont prn vicodin for severe pain  2. Fall in elderly patient, sequela -doing better, had reinjured his thumb  3. Chronic cough -cont current txs as per Dr. Elsworth Soho  4. Allergic rhinitis, unspecified allergic rhinitis type -cont zyrtec per Dr. Bari Mantis notes  5. Fecal smearing -resolved with kegels -hemoccult negative today  6. Depression -mood varies considerably visit to visit;  Cont celexa  7. Hyperlipidemia -lipids at goal with current therapy, no changes  8. Encounter for therapeutic drug monitoring -reviewed that we must be careful using oxycodone for him and also with changes to his celexa  Labs/tests ordered: none Next appt:  6 mos

## 2014-05-25 ENCOUNTER — Other Ambulatory Visit: Payer: Self-pay | Admitting: Internal Medicine

## 2014-05-26 ENCOUNTER — Other Ambulatory Visit: Payer: Self-pay | Admitting: Nurse Practitioner

## 2014-05-26 NOTE — Telephone Encounter (Signed)
If not on file from 05/25/14

## 2014-05-29 ENCOUNTER — Encounter: Payer: Self-pay | Admitting: Surgery

## 2014-06-01 ENCOUNTER — Encounter: Payer: Self-pay | Admitting: Surgery

## 2014-06-01 ENCOUNTER — Ambulatory Visit
Admission: RE | Admit: 2014-06-01 | Discharge: 2014-06-01 | Disposition: A | Discharge status: Home / Self Care | Payer: Medicare Other | Source: Ambulatory Visit | Attending: Surgery | Admitting: Surgery

## 2014-06-01 ENCOUNTER — Ambulatory Visit (INDEPENDENT_AMBULATORY_CARE_PROVIDER_SITE_OTHER): Payer: Medicare Other | Admitting: Surgery

## 2014-06-01 ENCOUNTER — Ambulatory Visit (HOSPITAL_COMMUNITY)
Admission: RE | Admit: 2014-06-01 | Discharge: 2014-06-01 | Disposition: A | Discharge status: Home / Self Care | Payer: Medicare Other | Source: Ambulatory Visit | Attending: Surgery | Admitting: Surgery

## 2014-06-01 VITALS — BP 139/72 | HR 74 | Ht 68.0 in | Wt 197.0 lb

## 2014-06-01 DIAGNOSIS — I714 Abdominal aortic aneurysm, without rupture, unspecified: Secondary | ICD-10-CM

## 2014-06-01 DIAGNOSIS — Z48812 Encounter for surgical aftercare following surgery on the circulatory system: Secondary | ICD-10-CM | POA: Insufficient documentation

## 2014-06-01 DIAGNOSIS — I6529 Occlusion and stenosis of unspecified carotid artery: Secondary | ICD-10-CM | POA: Diagnosis not present

## 2014-06-01 MED ORDER — IOHEXOL 350 MG/ML SOLN
75.0000 mL | Freq: Once | INTRAVENOUS | Status: AC | PRN
  Administered 2014-06-01: 75 mL via INTRAVENOUS

## 2014-06-01 NOTE — Progress Notes (Signed)
Patient name: Andrew Vega MRN: [id number] DOB: 09/25/1926 Sex: male     Chief Complaint  Patient presents with  . Re-evaluation    6 month f/u carotid    HISTORY OF PRESENT ILLNESS: The patient is back today for his first postoperative visit. 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 Maximum aortic size was 5.3 cm. I placed a proximal extension for a type I endoleak. This was originally scheduled to be performed by Dr. Kellie Simmering, however he had a family emergency and therefore I did the procedure. The patient's postoperative course was uncomplicated.  He is without abdominal pain.  The patient had any outside ultrasound of his carotid arteries which showed moderate disease.  He remained asymptomatic.  Specifically he denies numbness or weakness in either extremity.  He denies slurred speech.  He denies her as is the exact.  The patient continues to be a nonsmoker.  He continues to take a statin 4 hypercholesterolemia.   Past Medical History  Diagnosis Date  . Anxiety   . Hyperlipidemia   . Abdominal aneurysm without mention of rupture   . Insomnia, unspecified   . Colon polyps   . Arthritis   . Diverticulosis of colon (without mention of hemorrhage)   . COPD (chronic obstructive pulmonary disease)   . Inguinal hernia   . Cancer     skin cancer removal  . Anginal pain     recent hospitalization  . H/O hiatal hernia     Past Surgical History  Procedure Laterality Date  . Vasectomy  1962  . Cholecystectomy  1962  . Colon surgery  2007  . Bilateral shoulder repair    . Nasal septum surgery  2011  . Hernia repair    . Abdominal aortic endovascular stent graft N/A 10/29/2013    Procedure: ABDOMINAL AORTIC ENDOVASCULAR STENT GRAFT;  Surgeon: [name], MD;  Location: [facility] OR;  Service: Vascular;  Laterality: N/A;    History   Social History  . Marital Status: Married    Spouse Name: N/A    Number of Children: N/A  . Years of  Education: N/A   Occupational History  . retired    Social History Main Topics  . Smoking status: Former Smoker -- 0.75 packs/day for 25 years    Types: Cigarettes    Quit date: 10/23/1984  . Smokeless tobacco: Never Used  . Alcohol Use: 0.6 oz/week    1 Glasses of wine per week     Comment: social  . Drug Use: No  . Sexual Activity: No   Other Topics Concern  . Not on file   Social History Narrative  . No narrative on file    Family History  Problem Relation Age of Onset  . Colon cancer Neg Hx   . Heart attack Father 90    Allergies as of 06/01/2014 - Review Complete 06/01/2014  Allergen Reaction Noted  . Celebrex [celecoxib] Other (See Comments) 07/10/2013  . Codeine Other (See Comments) 04/14/2013    Current Outpatient Prescriptions on File Prior to Visit  Medication Sig Dispense Refill  . aspirin 81 MG tablet Take 81 mg by mouth daily.      . B Complex-C (SUPER B COMPLEX PO) Take 1 tablet by mouth daily.       . chlorpheniramine (CHLOR-TRIMETON) 4 MG tablet Take 4-8 mg by mouth every 6 (six) hours as needed for allergies.       . Cholecalciferol (VITAMIN  D) 2000 UNITS CAPS Take 2,000 Units by mouth daily.       . citalopram (CELEXA) 40 MG tablet Take 40 mg by mouth daily.      . Glucosamine-Chondroit-Vit C-Mn (GLUCOSAMINE CHONDR 1500 COMPLX PO) Take 1 capsule by mouth daily.      Marland Kitchen HYDROcodone-acetaminophen (NORCO/VICODIN) 5-325 MG per tablet Take 1 tablet by mouth every 6 (six) hours as needed for moderate pain.  15 tablet  0  . LORazepam (ATIVAN) 1 MG tablet 1 1/2 by mouth daily      . Melatonin 3 MG TABS Take 3 tablets by mouth at bedtime.       . Multiple Vitamin (MULTIVITAMIN) tablet Take 1 tablet by mouth daily.      . niacin 100 MG tablet Take 250 mg by mouth daily with breakfast.       . Omega-3 Fatty Acids (FISH OIL) 1000 MG CAPS Take 1 capsule by mouth daily.      Marland Kitchen OVER THE COUNTER MEDICATION Place 1 drop into both eyes every morning.      .  simvastatin (ZOCOR) 40 MG tablet TAKE 1 TABLET BY MOUTH AT BEDTIME FOR CHOLESTEROL  90 tablet  3  . vitamin E 400 UNIT capsule Take 400 Units by mouth daily.       No current facility-administered medications on file prior to visit.     REVIEW OF SYSTEMS: Cardiovascular: No chest pain, chest pressure, palpitations, orthopnea, or dyspnea on exertion. No claudication or rest pain,  No history of DVT or phlebitis. Pulmonary: Positive for productive cough.  No, asthma or wheezing. Neurologic: No weakness, paresthesias, aphasia, or amaurosis. No dizziness. Hematologic: No bleeding problems or clotting disorders. Musculoskeletal: No joint pain or joint swelling. Gastrointestinal: No blood in stool or hematemesis Genitourinary: No dysuria or hematuria. Psychiatric:: No history of major depression. Integumentary: No rashes or ulcers. Constitutional: No fever or chills.  PHYSICAL EXAMINATION:   Vital signs are BP 139/72  Pulse 74  Ht 5\' 8"  (1.727 m)  Wt 197 lb (89.359 kg)  BMI 29.96 kg/m2  SpO2 95% General: The patient appears their stated age. HEENT:  No gross abnormalities Pulmonary:  Non labored breathing Abdomen: Soft and non-tender.  Aorta is not palpable. Musculoskeletal: There are no major deformities. Neurologic: No focal weakness or paresthesias are detected, Skin: There are no ulcer or rashes noted. Psychiatric: The patient has normal affect. Cardiovascular: There is a regular rate and rhythm without significant murmur appreciated.  No carotid bruits   Diagnostic Studies I have reviewed his CT angiogram.  This showed that his stent graft is in good position.  There is a small type II endoleak from a lumbar artery.  There has been minimal increase in the size of his aneurysm.  I have ordered and reviewed his carotid ultrasound.  This shows less than 40% right carotid stenosis and 40-59% left carotid stenosis  Assessment: #1: Abdominal aortic aneurysm #2: Carotid  stenosis Plan: #1: I reviewed the CT scan findings with the patient.  There has been a mild increase in the size of his aneurysm with a known type II endoleak from a lumbar artery.  I recommended surveillance CT imaging in 6 months.  If the aneurysm continues to grow, however consider referral to interventional radiology for embolization.  #2: The patient has a symptomatic carotid stenosis.  I will continue to follow him with surveillance ultrasound, his neck study will be in one year.  Eldridge Abrahams, M.D. [facility] Office: [phone] Pager:  [phone]

## 2014-06-01 NOTE — Addendum Note (Signed)
Addended by: Mena Goes on: 06/01/2014 12:28 PM   Modules accepted: Orders

## 2014-06-03 DIAGNOSIS — F411 Generalized anxiety disorder: Secondary | ICD-10-CM | POA: Diagnosis not present

## 2014-06-15 DIAGNOSIS — F411 Generalized anxiety disorder: Secondary | ICD-10-CM | POA: Diagnosis not present

## 2014-06-17 DIAGNOSIS — M19049 Primary osteoarthritis, unspecified hand: Secondary | ICD-10-CM | POA: Diagnosis not present

## 2014-06-25 ENCOUNTER — Other Ambulatory Visit: Payer: Self-pay | Admitting: Internal Medicine

## 2014-07-02 ENCOUNTER — Ambulatory Visit: Payer: Medicare Other | Admitting: Internal Medicine

## 2014-07-02 DIAGNOSIS — F411 Generalized anxiety disorder: Secondary | ICD-10-CM | POA: Diagnosis not present

## 2014-07-14 DIAGNOSIS — B351 Tinea unguium: Secondary | ICD-10-CM | POA: Diagnosis not present

## 2014-07-14 DIAGNOSIS — M79609 Pain in unspecified limb: Secondary | ICD-10-CM | POA: Diagnosis not present

## 2014-07-14 DIAGNOSIS — S90129A Contusion of unspecified lesser toe(s) without damage to nail, initial encounter: Secondary | ICD-10-CM | POA: Diagnosis not present

## 2014-07-16 DIAGNOSIS — F411 Generalized anxiety disorder: Secondary | ICD-10-CM | POA: Diagnosis not present

## 2014-07-23 DIAGNOSIS — Z23 Encounter for immunization: Secondary | ICD-10-CM | POA: Diagnosis not present

## 2014-07-28 ENCOUNTER — Other Ambulatory Visit: Payer: Self-pay | Admitting: Internal Medicine

## 2014-07-30 DIAGNOSIS — F411 Generalized anxiety disorder: Secondary | ICD-10-CM | POA: Diagnosis not present

## 2014-07-30 DIAGNOSIS — L570 Actinic keratosis: Secondary | ICD-10-CM | POA: Diagnosis not present

## 2014-08-10 ENCOUNTER — Other Ambulatory Visit: Payer: Self-pay | Admitting: Internal Medicine

## 2014-08-13 ENCOUNTER — Encounter: Payer: Self-pay | Admitting: Adult Health

## 2014-08-13 ENCOUNTER — Ambulatory Visit (INDEPENDENT_AMBULATORY_CARE_PROVIDER_SITE_OTHER): Payer: Medicare Other | Admitting: Adult Health

## 2014-08-13 VITALS — BP 128/80 | HR 75 | Temp 97.0°F | Ht 68.0 in | Wt 204.0 lb

## 2014-08-13 DIAGNOSIS — R053 Chronic cough: Secondary | ICD-10-CM

## 2014-08-13 DIAGNOSIS — I6529 Occlusion and stenosis of unspecified carotid artery: Secondary | ICD-10-CM

## 2014-08-13 DIAGNOSIS — R05 Cough: Secondary | ICD-10-CM | POA: Diagnosis not present

## 2014-08-13 NOTE — Patient Instructions (Addendum)
Take Chlortrimeton 4mg  2 tabs At bedtime   Try  Allegra 180mg  daily in am  Saline nasal rinses Twice daily  As needed   Deslym 2 tsp Twice daily  As needed cough .  NO mints Sugarless candy to help with cough and throat clearing along with sips of water.  Follow up Dr. Elsworth Soho  In 6  months and As needed   Please contact office for sooner follow up if symptoms do not improve or worsen or seek emergency care

## 2014-08-13 NOTE — Assessment & Plan Note (Signed)
Chronic cough improved with tx aimed at AR prevention  Workup with previous cxr no acute process,spirometry nml  Will cont on regimen aimed to prevent post nasal drip   Plan  Take Chlortrimeton 4mg  2 tabs At bedtime   Try  Allegra 180mg  daily in am  Saline nasal rinses Twice daily  As needed   Deslym 2 tsp Twice daily  As needed cough .  NO mints Sugarless candy to help with cough and throat clearing along with sips of water.  Follow up Dr. Elsworth Soho  In 6  months and As needed   Please contact office for sooner follow up if symptoms do not improve or worsen or seek emergency care

## 2014-08-13 NOTE — Progress Notes (Signed)
   Subjective:    Patient ID: Andrew Vega, male    DOB: 05-11-1926, 80 y.o.   MRN: [id number]  HPI  80 year old remote smoker for FU of chronic cough.  01/13/14 IOV  He moved from [place] to [place] in 2014.  He reports a deep cough minimally productive of clear phlegm for about a year. He reports postnasal drip which is worse in the mornings and was not relieved by Nasonex and Claritin. Quit smoking in 1986--smoked less than a pack a day age [age], started again when 54 until 66 - about 47 pyrs.  CXR from 08/23/13 showed emphysema and old granulomatous disease. Spirometry ok  04/15/14   Placed on GERD /AR/Cough regimen -could not get CPM, took benadryl instead Deslym really helped. Reviewed CT  -mild atx vs scarring bibasal  08/13/2014 Follow up  (chronic cough)  Returns for 4 month follow up .  Cough is much better but not completely gone Still has throat clearing. Has stopped using his cough regimen on consistent basis .  Is using chlortrimeton on/off p. Stopped zyrtec , did not try allegra. Most of symptoms occur at night . Throat clearing is main lingering symptom.  Has been dealing with thumb fx after a fall.  No fever , chest pain, orthopnea, edema or hemoptysis     Review of Systems  neg for any significant sore throat, dysphagia, itching, sneezing, nasal congestion or excess/ purulent secretions, fever, chills, sweats, unintended wt loss, pleuritic or exertional cp, hempoptysis, orthopnea pnd or change in chronic leg swelling. Also denies presyncope, palpitations, heartburn, abdominal pain, nausea, vomiting, diarrhea or change in bowel or urinary habits, dysuria,hematuria, rash, arthralgias, visual complaints, headache, numbness weakness or ataxia.     Objective:   Physical Exam   Gen. Pleasant, well-nourished, in no distress ENT - no lesions, no post nasal drip Neck: No JVD, no thyromegaly, no carotid bruits Lungs: no use of accessory muscles, no dullness to  percussion, bibasal rales, no rhonchi  Cardiovascular: Rhythm regular, heart sounds  normal, no murmurs or gallops, no peripheral edema Musculoskeletal: No deformities, no cyanosis or clubbing  Arthritic changes in hands.        Assessment & Plan:

## 2014-08-14 ENCOUNTER — Ambulatory Visit: Payer: Medicare Other | Admitting: Adult Health

## 2014-08-14 NOTE — Progress Notes (Signed)
Reviewed & agree with plan  

## 2014-08-19 DIAGNOSIS — F411 Generalized anxiety disorder: Secondary | ICD-10-CM | POA: Diagnosis not present

## 2014-09-10 ENCOUNTER — Other Ambulatory Visit: Payer: Self-pay | Admitting: Internal Medicine

## 2014-09-10 DIAGNOSIS — F411 Generalized anxiety disorder: Secondary | ICD-10-CM | POA: Diagnosis not present

## 2014-09-24 DIAGNOSIS — B351 Tinea unguium: Secondary | ICD-10-CM | POA: Diagnosis not present

## 2014-09-24 DIAGNOSIS — M79674 Pain in right toe(s): Secondary | ICD-10-CM | POA: Diagnosis not present

## 2014-10-01 DIAGNOSIS — F411 Generalized anxiety disorder: Secondary | ICD-10-CM | POA: Diagnosis not present

## 2014-10-13 DIAGNOSIS — L814 Other melanin hyperpigmentation: Secondary | ICD-10-CM | POA: Diagnosis not present

## 2014-10-13 DIAGNOSIS — L853 Xerosis cutis: Secondary | ICD-10-CM | POA: Diagnosis not present

## 2014-10-13 DIAGNOSIS — L570 Actinic keratosis: Secondary | ICD-10-CM | POA: Diagnosis not present

## 2014-10-13 DIAGNOSIS — L821 Other seborrheic keratosis: Secondary | ICD-10-CM | POA: Diagnosis not present

## 2014-10-13 DIAGNOSIS — D2272 Melanocytic nevi of left lower limb, including hip: Secondary | ICD-10-CM | POA: Diagnosis not present

## 2014-10-13 DIAGNOSIS — D225 Melanocytic nevi of trunk: Secondary | ICD-10-CM | POA: Diagnosis not present

## 2014-10-13 DIAGNOSIS — D2271 Melanocytic nevi of right lower limb, including hip: Secondary | ICD-10-CM | POA: Diagnosis not present

## 2014-10-13 DIAGNOSIS — D1801 Hemangioma of skin and subcutaneous tissue: Secondary | ICD-10-CM | POA: Diagnosis not present

## 2014-10-19 ENCOUNTER — Other Ambulatory Visit: Payer: Self-pay | Admitting: Internal Medicine

## 2014-10-28 DIAGNOSIS — F411 Generalized anxiety disorder: Secondary | ICD-10-CM | POA: Diagnosis not present

## 2014-11-12 DIAGNOSIS — M2042 Other hammer toe(s) (acquired), left foot: Secondary | ICD-10-CM | POA: Diagnosis not present

## 2014-11-12 DIAGNOSIS — M79675 Pain in left toe(s): Secondary | ICD-10-CM | POA: Diagnosis not present

## 2014-11-26 ENCOUNTER — Ambulatory Visit: Payer: Medicare Other | Admitting: Internal Medicine

## 2014-11-30 ENCOUNTER — Ambulatory Visit (INDEPENDENT_AMBULATORY_CARE_PROVIDER_SITE_OTHER): Payer: Medicare Other | Admitting: Gastroenterology

## 2014-11-30 ENCOUNTER — Encounter: Payer: Self-pay | Admitting: Gastroenterology

## 2014-11-30 VITALS — BP 120/78 | HR 94 | Ht 69.0 in | Wt 194.0 lb

## 2014-11-30 DIAGNOSIS — R159 Full incontinence of feces: Secondary | ICD-10-CM | POA: Diagnosis not present

## 2014-11-30 DIAGNOSIS — M2042 Other hammer toe(s) (acquired), left foot: Secondary | ICD-10-CM | POA: Diagnosis not present

## 2014-11-30 DIAGNOSIS — M79674 Pain in right toe(s): Secondary | ICD-10-CM | POA: Diagnosis not present

## 2014-11-30 NOTE — Progress Notes (Signed)
Review of pertinent gastrointestinal problems: 1. Intermittent fecal soilage 12/2013; decreased anal tone resting and with valsalva on examination; symptoms completely resolved with Kegles BID and fiber supplements. 2. H/o colon polyps Colon resection surgically, long time ago for polyp that could not be removed endoscopically.. This was about 2007 years ago in [facility].Multiple colonoscopies, last was 2010;  Met Dr. Ardis Hughs 2014 (at age 56) recommended against further colonoscopies for routine surveillance or screening (age)   HPI: This is a  very pleasant 81 year old man whom I last saw several months ago.  He is "improved considerably".  After discussing with him for a while I am not sure that he really recalls why he was seeing me. He does admit his memory is slipping a bit. He has a wife with Alzheimer's and a lot of his attention is caring for her. She does have 12 hour per day help at home as well.  Has not been kegling twice daily.  Still taking fiber supplement (not daily however).  No UGI symptoms  Down 4 pounds since last visit.   Past Medical History  Diagnosis Date  . Anxiety   . Hyperlipidemia   . Abdominal aneurysm without mention of rupture   . Insomnia, unspecified   . Colon polyps   . Arthritis   . Diverticulosis of colon (without mention of hemorrhage)   . COPD (chronic obstructive pulmonary disease)   . Inguinal hernia   . Cancer     skin cancer removal  . Anginal pain     recent hospitalization  . H/O hiatal hernia     Past Surgical History  Procedure Laterality Date  . Vasectomy  1962  . Cholecystectomy  1962  . Colon surgery  2007  . Bilateral shoulder repair    . Nasal septum surgery  2011  . Hernia repair    . Abdominal aortic endovascular stent graft N/A 10/29/2013    Procedure: ABDOMINAL AORTIC ENDOVASCULAR STENT GRAFT;  Surgeon: [name], MD;  Location: [facility];  Service: Vascular;  Laterality: N/A;    Current Outpatient Prescriptions   Medication Sig Dispense Refill  . aspirin 81 MG tablet Take 81 mg by mouth daily.    . B Complex-C (SUPER B COMPLEX PO) Take 1 tablet by mouth daily.     . chlorpheniramine (CHLOR-TRIMETON) 4 MG tablet Take 4-8 mg by mouth every 6 (six) hours as needed for allergies.     . Cholecalciferol (VITAMIN D) 2000 UNITS CAPS Take 2,000 Units by mouth daily.     . citalopram (CELEXA) 40 MG tablet TAKE 1 TABLET BY MOUTH EVERY DAY 90 tablet 1  . Glucosamine-Chondroit-Vit C-Mn (GLUCOSAMINE CHONDR 1500 COMPLX PO) Take 1 capsule by mouth daily.    Marland Kitchen LORazepam (ATIVAN) 1 MG tablet TAKE 1/2 TABLET BY MOUTH EVERY DAY AND 1 TABLET EVERY NIGHT AT BEDTIME 45 tablet 1  . Melatonin 3 MG TABS Take 3 tablets by mouth at bedtime.     . Multiple Vitamin (MULTIVITAMIN) tablet Take 1 tablet by mouth daily.    . niacin 100 MG tablet Take 250 mg by mouth daily with breakfast.     . Omega-3 Fatty Acids (FISH OIL) 1000 MG CAPS Take 1 capsule by mouth daily.    Marland Kitchen OVER THE COUNTER MEDICATION Place 1 drop into both eyes every morning.    . simvastatin (ZOCOR) 40 MG tablet TAKE 1 TABLET BY MOUTH AT BEDTIME FOR CHOLESTEROL 90 tablet 3  . vitamin E 400 UNIT capsule Take  400 Units by mouth daily.     No current facility-administered medications for this visit.    Allergies as of 11/30/2014 - Review Complete 11/30/2014  Allergen Reaction Noted  . Celebrex [celecoxib] Other (See Comments) 07/10/2013  . Codeine Other (See Comments) 04/14/2013    Family History  Problem Relation Age of Onset  . Colon cancer Neg Hx   . Heart attack Father 45    History   Social History  . Marital Status: Married    Spouse Name: N/A    Number of Children: N/A  . Years of Education: N/A   Occupational History  . retired    Social History Main Topics  . Smoking status: Former Smoker -- 0.75 packs/day for 25 years    Types: Cigarettes    Quit date: 10/23/1984  . Smokeless tobacco: Never Used  . Alcohol Use: 0.6 oz/week    1 Glasses  of wine per week     Comment: social  . Drug Use: No  . Sexual Activity: No   Other Topics Concern  . Not on file   Social History Narrative      Physical Exam: BP 120/78 mmHg  Pulse 94  Ht $R'5\' 9"'je$  (1.753 m)  Wt 194 lb (87.998 kg)  BMI 28.64 kg/m2  SpO2 98% Constitutional: generally well-appearing Psychiatric: alert and oriented x3 Abdomen: soft, nontender, nondistended, no obvious ascites, no peritoneal signs, normal bowel sounds     Assessment and plan: 81 y.o. male with minor fecal incontinence  I think his memory is going a bit. He admits that as well. Seems that he is just forgetting to take his fiber supplement or do the [place] exercises that were significantly helping him with his minor fecal incontinence. I wrote on his recommendations for him try to give him a few tricks to remember these. Recommended that he even asked his wife's daily caregivers to possibly remind him about this as well. He'll return to see me on an as-needed basis.

## 2014-11-30 NOTE — Patient Instructions (Signed)
Try to take your fiber supplement every day, morning is usually easiest. Kegle excercises twice daily.

## 2014-12-01 ENCOUNTER — Other Ambulatory Visit: Payer: Self-pay | Admitting: *Deleted

## 2014-12-01 DIAGNOSIS — Z01812 Encounter for preprocedural laboratory examination: Secondary | ICD-10-CM

## 2014-12-02 LAB — BUN: BUN: 23 mg/dL (ref 6–23)

## 2014-12-02 LAB — CREATININE, SERUM: Creat: 1.5 mg/dL — ABNORMAL HIGH (ref 0.50–1.35)

## 2014-12-03 ENCOUNTER — Encounter: Payer: Self-pay | Admitting: Internal Medicine

## 2014-12-03 ENCOUNTER — Ambulatory Visit (INDEPENDENT_AMBULATORY_CARE_PROVIDER_SITE_OTHER): Payer: Medicare Other | Admitting: Internal Medicine

## 2014-12-03 VITALS — BP 110/72 | HR 74 | Temp 98.3°F | Resp 20 | Ht 69.0 in | Wt 198.8 lb

## 2014-12-03 DIAGNOSIS — F329 Major depressive disorder, single episode, unspecified: Secondary | ICD-10-CM

## 2014-12-03 DIAGNOSIS — E785 Hyperlipidemia, unspecified: Secondary | ICD-10-CM

## 2014-12-03 DIAGNOSIS — Z23 Encounter for immunization: Secondary | ICD-10-CM | POA: Diagnosis not present

## 2014-12-03 DIAGNOSIS — R059 Cough, unspecified: Secondary | ICD-10-CM

## 2014-12-03 DIAGNOSIS — R151 Fecal smearing: Secondary | ICD-10-CM | POA: Diagnosis not present

## 2014-12-03 DIAGNOSIS — G4700 Insomnia, unspecified: Secondary | ICD-10-CM | POA: Diagnosis not present

## 2014-12-03 DIAGNOSIS — R05 Cough: Secondary | ICD-10-CM

## 2014-12-03 DIAGNOSIS — F32A Depression, unspecified: Secondary | ICD-10-CM

## 2014-12-03 MED ORDER — FEXOFENADINE HCL 180 MG PO TABS: 180.0000 mg | ORAL_TABLET | Freq: Every day | ORAL | Status: DC

## 2014-12-03 MED ORDER — CITALOPRAM HYDROBROMIDE 40 MG PO TABS: 40.0000 mg | ORAL_TABLET | Freq: Every day | ORAL | Status: DC

## 2014-12-03 NOTE — Patient Instructions (Signed)
Please follow up with your therapist.

## 2014-12-03 NOTE — Progress Notes (Signed)
Patient ID: Andrew Vega, male   DOB: 12/20/1925, 81 y.o.   MRN: [id number]   Location:  [facility] / [facility]  Code Status: full code  Allergies  Allergen Reactions  . Celebrex [Celecoxib] Other (See Comments)    Raised B/P   . Codeine Other (See Comments)    Severe stomach pain     Chief Complaint  Patient presents with  . Medical Management of Chronic Issues    questions about celexa      HPI: Patient is a 81 y.o. white male seen in the office today for med mgt of chronic diseases.    Has recently had a lot of dental work.  Saw Dr. Ardis Hughs for his fecal incontinence.  Had slacked on his kegel exercises and the fiber supplement.  He picked them back up again and things are better when he does those.  Trouble finding words, admits some depression also, says he is not concerned about dementia.  He notes it happens more often when he's upset and anxious about Manuela Schwartz, but also can happen in regular conversation.  Knows he's not socially involved at [place].  Thinks his brain has gotten lazy.  The opposite of loneliness is not togetherness, but intimacy, he says.  Does not have someone to talk with and share things with b/c caregivers feel like he's interfering with their caring for her and routine.  Had been seeing therapist, but she left.  Is planning to refer him somewhere else.    When Manuela Schwartz was first diagnosed when they lived in [place], he went to a psychiatrist.  He got started on celexa.  Has not been taking it.    Has f/u next week for AAA  Also had bum toe and sees podiatry now.  Cough had improved nicely and picked back up.  Clearing his throat frequently.  Thinks he stopped the chlortrimeton.  Does have the allegra.    Review of Systems:  Review of Systems  Constitutional: Positive for malaise/fatigue. Negative for fever and chills.  HENT: Positive for congestion.   Respiratory: Positive for cough. Negative for shortness of breath.     Cardiovascular: Negative for chest pain and leg swelling.  Gastrointestinal: Negative for abdominal pain, diarrhea, constipation, blood in stool and melena.       Fecal incontinence problematic if does not take fiber and do kegels  Genitourinary: Negative for dysuria.  Musculoskeletal: Negative for falls.  Skin: Negative for rash.  Neurological: Negative for dizziness and loss of consciousness.  Endo/Heme/Allergies: Does not bruise/bleed easily.  Psychiatric/Behavioral: Positive for depression and memory loss. The patient is nervous/anxious and has insomnia.      Past Medical History  Diagnosis Date  . Anxiety   . Hyperlipidemia   . Abdominal aneurysm without mention of rupture   . Insomnia, unspecified   . Colon polyps   . Arthritis   . Diverticulosis of colon (without mention of hemorrhage)   . COPD (chronic obstructive pulmonary disease)   . Inguinal hernia   . Cancer     skin cancer removal  . Anginal pain     recent hospitalization  . H/O hiatal hernia     Past Surgical History  Procedure Laterality Date  . Vasectomy  1962  . Cholecystectomy  1962  . Colon surgery  2007  . Bilateral shoulder repair    . Nasal septum surgery  2011  . Hernia repair    . Abdominal aortic endovascular stent graft N/A 10/29/2013  Procedure: ABDOMINAL AORTIC ENDOVASCULAR STENT GRAFT;  Surgeon: [name], MD;  Location: [facility] OR;  Service: Vascular;  Laterality: N/A;    Social History:   reports that he quit smoking about 30 years ago. His smoking use included Cigarettes. He has a 18.75 pack-year smoking history. He has never used smokeless tobacco. He reports that he drinks about 0.6 oz of alcohol per week. He reports that he does not use illicit drugs.  Family History  Problem Relation Age of Onset  . Colon cancer Neg Hx   . Heart attack Father 12    Medications: Patient's Medications  New Prescriptions   No medications on file  Previous Medications   ASPIRIN 81 MG TABLET     Take 81 mg by mouth daily.   B COMPLEX-C (SUPER B COMPLEX PO)    Take 1 tablet by mouth daily.    CHLORPHENIRAMINE (CHLOR-TRIMETON) 4 MG TABLET    Take 4-8 mg by mouth every 6 (six) hours as needed for allergies.    CHOLECALCIFEROL (VITAMIN D) 2000 UNITS CAPS    Take 2,000 Units by mouth daily.    CITALOPRAM (CELEXA) 40 MG TABLET    TAKE 1 TABLET BY MOUTH EVERY DAY   GLUCOSAMINE-CHONDROIT-VIT C-MN (GLUCOSAMINE CHONDR 1500 COMPLX PO)    Take 1 capsule by mouth daily.   LORAZEPAM (ATIVAN) 1 MG TABLET    TAKE 1/2 TABLET BY MOUTH EVERY DAY AND 1 TABLET EVERY NIGHT AT BEDTIME   MELATONIN 3 MG TABS    Take 3 tablets by mouth at bedtime.    MULTIPLE VITAMIN (MULTIVITAMIN) TABLET    Take 1 tablet by mouth daily.   NIACIN 100 MG TABLET    Take 250 mg by mouth daily with breakfast.    OMEGA-3 FATTY ACIDS (FISH OIL) 1000 MG CAPS    Take 1 capsule by mouth daily.   OVER THE COUNTER MEDICATION    Place 1 drop into both eyes every morning.   SIMVASTATIN (ZOCOR) 40 MG TABLET    TAKE 1 TABLET BY MOUTH AT BEDTIME FOR CHOLESTEROL   VITAMIN E 400 UNIT CAPSULE    Take 400 Units by mouth daily.  Modified Medications   No medications on file  Discontinued Medications   No medications on file     Physical Exam: Filed Vitals:   12/03/14 1434  BP: 110/72  Pulse: 74  Temp: 98.3 F (36.8 C)  TempSrc: Oral  Resp: 20  Height: 5\' 9"  (1.753 m)  Weight: 198 lb 12.8 oz (90.175 kg)  SpO2: 95%   )Physical Exam  Constitutional: He is oriented to person, place, and time. He appears well-developed and well-nourished. No distress.  Cardiovascular: Normal rate, regular rhythm, normal heart sounds and intact distal pulses.   Pulmonary/Chest: Effort normal and breath sounds normal.  Abdominal: Soft. Bowel sounds are normal. He exhibits no distension.  Musculoskeletal: Normal range of motion.  Neurological: He is alert and oriented to person, place, and time.  Psychiatric: He has a normal mood and affect.      Labs reviewed: Basic Metabolic Panel:  Recent Labs  03/30/14 0920 05/19/14 0927 12/01/14 1017  NA 142 140  --   K 4.1 4.4  --   CL 105 104  --   CO2 25 21  --   GLUCOSE 98 96  --   BUN 20 19 23   CREATININE 1.46* 1.41* 1.50*  CALCIUM 9.2 9.3  --    Liver Function Tests:  Recent Labs  03/30/14 0920 05/19/14 0927  AST 25 21  ALT 19 18  ALKPHOS 102 75  BILITOT 0.4 0.4  PROT 6.9 6.8   No results for input(s): LIPASE, AMYLASE in the last 8760 hours. No results for input(s): AMMONIA in the last 8760 hours. CBC:  Recent Labs  03/30/14 0920 05/19/14 0927  WBC 7.3 8.2  NEUTROABS 4.3 5.0  HGB 13.4 13.5  HCT 39.4 38.6  MCV 92 91  PLT 203 193   Lipid Panel:  Recent Labs  03/30/14 0920 05/19/14 0927  HDL 47 47  LDLCALC 94 87  TRIG 143 135  CHOLHDL 3.6 3.4   Assessment/Plan 1. Cough - fexofenadine (ALLEGRA) 180 MG tablet; Take 1 tablet (180 mg total) by mouth daily.  Dispense: 30 tablet; Refill: 0 - restart the delsym, chlortrimeton and allegra--it is unclear for sure which of these he is truly doing and not doing and symptoms worsened (admits he was definitely not taking the chlortrimeton now)  2. Depression - has been worse also, but was not taking celexa -also has had his therapist leave to go elsewhere -plans to reach out to her b/c she was going to give him the name of someone else -recommended he follow through with that AND try to do more social activities to keep his mind stimulated - citalopram (CELEXA) 40 MG tablet; Take 1 tablet (40 mg total) by mouth daily.  Dispense: 90 tablet; Refill: 1  3. Fecal smearing -resume regular fiber  4. Insomnia -cont melatonin, lorazepam  5. Hyperlipidemia -cont zocor, diet, exercise encouraged  6. Need for vaccination with 13-polyvalent pneumococcal conjugate vaccine -given prevnar today  Labs/tests ordered:   Orders Placed This Encounter  Procedures  . Pneumococcal conjugate vaccine 13-valent    Next appt:  3 mos  Tranisha [facility] L. Medard Decuir, D.O. [facility] [address], [place] [address] Cell Phone (Mon-Fri 8am-5pm):  [phone] On Call:  [phone] & follow prompts after 5pm & weekends Office Phone:  [phone] Office Fax:  [phone]

## 2014-12-04 ENCOUNTER — Encounter: Payer: Self-pay | Admitting: Surgery

## 2014-12-07 ENCOUNTER — Inpatient Hospital Stay: Admission: RE | Admit: 2014-12-07 | Payer: Medicare Other | Source: Ambulatory Visit

## 2014-12-07 ENCOUNTER — Ambulatory Visit: Payer: Medicare Other | Admitting: Surgery

## 2014-12-07 ENCOUNTER — Encounter: Payer: Self-pay | Admitting: Surgery

## 2014-12-11 ENCOUNTER — Ambulatory Visit
Admission: RE | Admit: 2014-12-11 | Discharge: 2014-12-11 | Disposition: A | Discharge status: Home / Self Care | Payer: Medicare Other | Source: Ambulatory Visit | Attending: Surgery | Admitting: Surgery

## 2014-12-11 ENCOUNTER — Encounter: Payer: Self-pay | Admitting: Surgery

## 2014-12-11 DIAGNOSIS — I714 Abdominal aortic aneurysm, without rupture, unspecified: Secondary | ICD-10-CM

## 2014-12-11 MED ORDER — IOHEXOL 350 MG/ML SOLN
50.0000 mL | Freq: Once | INTRAVENOUS | Status: AC | PRN
  Administered 2014-12-11: 50 mL via INTRAVENOUS

## 2014-12-14 ENCOUNTER — Ambulatory Visit (INDEPENDENT_AMBULATORY_CARE_PROVIDER_SITE_OTHER): Payer: Medicare Other | Admitting: Surgery

## 2014-12-14 ENCOUNTER — Encounter: Payer: Self-pay | Admitting: Surgery

## 2014-12-14 VITALS — BP 138/72 | HR 64 | Temp 98.0°F | Resp 14 | Ht 68.0 in | Wt 198.6 lb

## 2014-12-14 DIAGNOSIS — I714 Abdominal aortic aneurysm, without rupture, unspecified: Secondary | ICD-10-CM

## 2014-12-14 DIAGNOSIS — Z48812 Encounter for surgical aftercare following surgery on the circulatory system: Secondary | ICD-10-CM | POA: Diagnosis not present

## 2014-12-14 DIAGNOSIS — T82330D Leakage of aortic (bifurcation) graft (replacement), subsequent encounter: Secondary | ICD-10-CM

## 2014-12-14 DIAGNOSIS — IMO0001 Reserved for inherently not codable concepts without codable children: Secondary | ICD-10-CM

## 2014-12-14 NOTE — Progress Notes (Signed)
Patient name: Andrew Vega MRN: [id number] DOB: 11-05-1925 Sex: male     Chief Complaint  Patient presents with  . Routine Post Op    EVAR 11-08-13    HISTORY OF PRESENT ILLNESS: The patient is back today for his first postoperative visit. 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 Maximum aortic size was 5.3 cm. I placed a proximal extension for a type I endoleak. This was originally scheduled to be performed by Dr. Kellie Simmering, however he had a family emergency and therefore I did the procedure. The patient's postoperative course was uncomplicated.   The patient had any outside ultrasound of his carotid arteries which showed moderate disease. He remained asymptomatic. The patient continues to be a nonsmoker. He continues to take a statin for hypercholesterolemia.  Patient denies any abdominal or back pain.  He continues to take care of his wife who has Alzheimer's at home    Past Medical History  Diagnosis Date  . Anxiety   . Hyperlipidemia   . Abdominal aneurysm without mention of rupture   . Insomnia, unspecified   . Colon polyps   . Arthritis   . Diverticulosis of colon (without mention of hemorrhage)   . COPD (chronic obstructive pulmonary disease)   . Inguinal hernia   . Cancer     skin cancer removal  . Anginal pain     recent hospitalization  . H/O hiatal hernia     Past Surgical History  Procedure Laterality Date  . Vasectomy  1962  . Cholecystectomy  1962  . Colon surgery  2007  . Bilateral shoulder repair    . Nasal septum surgery  2011  . Hernia repair    . Abdominal aortic endovascular stent graft N/A 10/29/2013    Procedure: ABDOMINAL AORTIC ENDOVASCULAR STENT GRAFT;  Surgeon: [name], MD;  Location: [facility] OR;  Service: Vascular;  Laterality: N/A;    History   Social History  . Marital Status: Married    Spouse Name: N/A  . Number of Children: N/A  . Years of Education: N/A   Occupational History  .  retired    Social History Main Topics  . Smoking status: Former Smoker -- 0.75 packs/day for 25 years    Types: Cigarettes    Quit date: 10/23/1984  . Smokeless tobacco: Never Used  . Alcohol Use: 0.6 oz/week    1 Glasses of wine per week     Comment: social  . Drug Use: No  . Sexual Activity: No   Other Topics Concern  . Not on file   Social History Narrative    Family History  Problem Relation Age of Onset  . Colon cancer Neg Hx   . Heart attack Father 66    Allergies as of 12/14/2014 - Review Complete 12/14/2014  Allergen Reaction Noted  . Celebrex [celecoxib] Other (See Comments) 07/10/2013  . Codeine Other (See Comments) 04/14/2013    Current Outpatient Prescriptions on File Prior to Visit  Medication Sig Dispense Refill  . aspirin 81 MG tablet Take 81 mg by mouth daily.    . B Complex-C (SUPER B COMPLEX PO) Take 1 tablet by mouth daily.     . chlorpheniramine (CHLOR-TRIMETON) 4 MG tablet Take 4-8 mg by mouth every 6 (six) hours as needed for allergies.     . Cholecalciferol (VITAMIN D) 2000 UNITS CAPS Take 2,000 Units by mouth daily.     . citalopram (CELEXA) 40 MG tablet Take  1 tablet (40 mg total) by mouth daily. 90 tablet 1  . fexofenadine (ALLEGRA) 180 MG tablet Take 1 tablet (180 mg total) by mouth daily. 30 tablet 0  . Glucosamine-Chondroit-Vit C-Mn (GLUCOSAMINE CHONDR 1500 COMPLX PO) Take 1 capsule by mouth daily.    Marland Kitchen LORazepam (ATIVAN) 1 MG tablet TAKE 1/2 TABLET BY MOUTH EVERY DAY AND 1 TABLET EVERY NIGHT AT BEDTIME 45 tablet 1  . Melatonin 3 MG TABS Take 3 tablets by mouth at bedtime.     . Multiple Vitamin (MULTIVITAMIN) tablet Take 1 tablet by mouth daily.    . niacin 100 MG tablet Take 250 mg by mouth daily with breakfast.     . Omega-3 Fatty Acids (FISH OIL) 1000 MG CAPS Take 1 capsule by mouth daily.    Marland Kitchen OVER THE COUNTER MEDICATION Place 1 drop into both eyes every morning.    . simvastatin (ZOCOR) 40 MG tablet TAKE 1 TABLET BY MOUTH AT BEDTIME FOR  CHOLESTEROL 90 tablet 3  . vitamin E 400 UNIT capsule Take 400 Units by mouth daily.     No current facility-administered medications on file prior to visit.     REVIEW OF SYSTEMS: Cardiovascular: No chest pain, chest pressure, palpitations, orthopnea, or dyspnea on exertion. No claudication or rest pain,  No history of DVT or phlebitis. Pulmonary: Positive for productive cough. Neurologic: No weakness, paresthesias, aphasia, or amaurosis. No dizziness. Hematologic: No bleeding problems or clotting disorders. Musculoskeletal: No joint pain or joint swelling. Gastrointestinal: No blood in stool or hematemesis Genitourinary: No dysuria or hematuria. Psychiatric:: No history of major depression. Integumentary: No rashes or ulcers. Constitutional: No fever or chills.  PHYSICAL EXAMINATION:   Vital signs are BP 138/72 mmHg  Pulse 64  Temp(Src) 98 F (36.7 C) (Oral)  Resp 14  Ht 5\' 8"  (1.727 m)  Wt 198 lb 9.6 oz (90.084 kg)  BMI 30.20 kg/m2  SpO2 98% General: The patient appears their stated age. HEENT:  No gross abnormalities Pulmonary:  Non labored breathing Abdomen: No pulsatile mass Musculoskeletal: There are no major deformities. Neurologic: No focal weakness or paresthesias are detected, Skin: There are no ulcer or rashes noted. Psychiatric: The patient has normal affect. Cardiovascular: Palpable pedal pulses   Diagnostic Studies I have reviewed his CT angiogram.  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  The aneurysm has increased in size.  It measures 5.6 today previously it was 5.4 cm.  Assessment: Status post endovascular aneurysm repair Plan: I discussed the findings of the persistent endoleak with the patient.  There has been increase in the size of his aneurysm, now measuring 5.6 cm.  I discussed at the next step was referral to interventional radiology to discuss embolization.  The patient is a year to get this done.  I haven't scheduled to  follow-up with me in 6 months with a CT scan.  He will see radiology within the next 1-2 weeks with hopeful recommendations for embolization in the immediate future.  Eldridge Abrahams, M.D. [facility] Office: [phone] Pager:  [phone]

## 2014-12-23 ENCOUNTER — Other Ambulatory Visit: Payer: Medicare Other

## 2014-12-24 ENCOUNTER — Ambulatory Visit
Admission: RE | Admit: 2014-12-24 | Discharge: 2014-12-24 | Disposition: A | Discharge status: Home / Self Care | Payer: Medicare Other | Source: Ambulatory Visit | Attending: Surgery | Admitting: Surgery

## 2014-12-24 DIAGNOSIS — Z48812 Encounter for surgical aftercare following surgery on the circulatory system: Secondary | ICD-10-CM | POA: Diagnosis not present

## 2014-12-24 DIAGNOSIS — T82330D Leakage of aortic (bifurcation) graft (replacement), subsequent encounter: Secondary | ICD-10-CM

## 2014-12-24 DIAGNOSIS — IMO0001 Reserved for inherently not codable concepts without codable children: Secondary | ICD-10-CM

## 2014-12-24 HISTORY — PX: IR GENERIC HISTORICAL: IMG1180011

## 2014-12-24 NOTE — Consult Note (Signed)
Chief Complaint: Chief Complaint  Patient presents with  . Advice Only    Consult for Endoleak Repair    Referring Physician(s): Brabham,Vance W  History of Present Illness: Andrew Vega is a 81 y.o. male with a history of infrarenal abdominal aortic aneurysm (5.3 cm) status post endovascular aortic repair with bifurcated stent graft performed on 10/29/2013.  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 Maximum aortic size was 5.3 cm.   Proximal extension cuff was placed at the time of initial repair for type IA endoleak.   Routine postoperative imaging surveillance identified a type II endoleak. Follow-up imaging at 6 month demonstrated persistence of the type II endoleak as well as interval enlargement of the excluded aneurysm sac from 5.4 cm to 5.6 cm. Patient presents today to discuss possible endoleak repair. Today, Andrew Vega is essentially clinically asymptomatic.  He denies abdominal, flank or back pain. No fever, chills, hypotension or syncope. On review of systems, he does report intermittent muscular pain in his paraspinal muscles of his lower back but states this is been intermittent and self-limited for some time. He has no such pain today.   Past Medical History  Diagnosis Date  . Anxiety   . Hyperlipidemia   . Abdominal aneurysm without mention of rupture   . Insomnia, unspecified   . Colon polyps   . Arthritis   . Diverticulosis of colon (without mention of hemorrhage)   . COPD (chronic obstructive pulmonary disease)   . Inguinal hernia   . Cancer     skin cancer removal  . Anginal pain     recent hospitalization  . H/O hiatal hernia     Past Surgical History  Procedure Laterality Date  . Vasectomy  1962  . Cholecystectomy  1962  . Colon surgery  2007  . Bilateral shoulder repair    . Nasal septum surgery  2011  . Hernia repair    . Abdominal aortic endovascular stent graft N/A 10/29/2013    Procedure: ABDOMINAL AORTIC  ENDOVASCULAR STENT GRAFT;  Surgeon: [name], MD;  Location: [facility] OR;  Service: Vascular;  Laterality: N/A;    Allergies: Celebrex and Codeine  Medications: Prior to Admission medications   Medication Sig Start Date End Date Taking? Authorizing Provider  B Complex-C (SUPER B COMPLEX PO) Take 1 tablet by mouth daily.    Yes Historical Provider, MD  chlorpheniramine (CHLOR-TRIMETON) 4 MG tablet Take 4-8 mg by mouth every 6 (six) hours as needed for allergies.    Yes Historical Provider, MD  Cholecalciferol (VITAMIN D) 2000 UNITS CAPS Take 2,000 Units by mouth daily.    Yes Historical Provider, MD  citalopram (CELEXA) 40 MG tablet Take 1 tablet (40 mg total) by mouth daily. 12/03/14  Yes Tiffany L Reed, DO  fexofenadine (ALLEGRA) 180 MG tablet Take 1 tablet (180 mg total) by mouth daily. 12/03/14  Yes Tiffany L Reed, DO  Glucosamine-Chondroit-Vit C-Mn (GLUCOSAMINE CHONDR 1500 COMPLX PO) Take 1 capsule by mouth daily.   Yes Historical Provider, MD  LORazepam (ATIVAN) 1 MG tablet TAKE 1/2 TABLET BY MOUTH EVERY DAY AND 1 TABLET EVERY NIGHT AT BEDTIME 10/19/14  Yes Tiffany L Reed, DO  Melatonin 3 MG TABS Take 3 tablets by mouth at bedtime.    Yes Historical Provider, MD  Multiple Vitamin (MULTIVITAMIN) tablet Take 1 tablet by mouth daily.   Yes Historical Provider, MD  niacin 100 MG tablet Take 250 mg by mouth daily with breakfast.  Yes Historical Provider, MD  Omega-3 Fatty Acids (FISH OIL) 1000 MG CAPS Take 1 capsule by mouth daily.   Yes Historical Provider, MD  OVER THE COUNTER MEDICATION Place 1 drop into both eyes every morning.   Yes Historical Provider, MD  simvastatin (ZOCOR) 40 MG tablet TAKE 1 TABLET BY MOUTH AT BEDTIME FOR CHOLESTEROL   Yes Tiffany L Reed, DO  vitamin E 400 UNIT capsule Take 400 Units by mouth daily.   Yes Historical Provider, MD  aspirin 81 MG tablet Take 81 mg by mouth daily.    Historical Provider, MD     Family History  Problem Relation Age of Onset  . Colon  cancer Neg Hx   . Heart attack Father 34    History   Social History  . Marital Status: Married    Spouse Name: N/A  . Number of Children: N/A  . Years of Education: N/A   Occupational History  . retired    Social History Main Topics  . Smoking status: Former Smoker -- 0.75 packs/day for 25 years    Types: Cigarettes    Quit date: 10/23/1984  . Smokeless tobacco: Never Used  . Alcohol Use: 0.6 oz/week    1 Glasses of wine per week     Comment: social  . Drug Use: No  . Sexual Activity: No   Other Topics Concern  . None   Social History Narrative    Review of Systems: A 12 point ROS discussed and pertinent positives are indicated in the HPI above.  All other systems are negative.  Review of Systems  Vital Signs: BP 140/79 mmHg  Pulse 68  Temp(Src) 97.7 F (36.5 C) (Oral)  Resp 14  Ht 5\' 8"  (1.727 m)  Wt 195 lb (88.451 kg)  BMI 29.66 kg/m2  SpO2 97%  Physical Exam  Constitutional: He is oriented to person, place, and time. He appears well-developed and well-nourished.  HENT:  Head: Normocephalic and atraumatic.  Mouth/Throat: Uvula is midline.  Eyes: No scleral icterus.  Cardiovascular: Normal rate, regular rhythm and normal heart sounds.   Pulmonary/Chest: Effort normal and breath sounds normal.  Abdominal: Soft. He exhibits no distension and no mass. There is no tenderness. There is no guarding.  Neurological: He is alert and oriented to person, place, and time.  Skin: Skin is warm and dry.  Psychiatric: He has a normal mood and affect. His behavior is normal.  Nursing note and vitals reviewed.   Mallampati Score:     Imaging: Ct Angio Abd/pel W/ And/or W/o  12/11/2014   CLINICAL DATA:  Post EVAR (10/29/2013).  EXAM: CTA ABDOMEN AND PELVIS WITHOUT CONTRAST  TECHNIQUE: Multidetector CT imaging of the abdomen and pelvis was performed using the standard protocol during bolus administration of intravenous contrast. Multiplanar reconstructed images and  MIPs were obtained and reviewed to evaluate the vascular anatomy.  CONTRAST:  52mL OMNIPAQUE IOHEXOL 350 MG/ML SOLN  COMPARISON:  CT abdomen pelvis - 06/01/2014; 12/01/2013.  FINDINGS: Vascular Findings:  Abdominal aorta: Post endovascular repair of infrarenal abdominal aortic aneurysm. The endovascular stent remains widely patent.  The proximal end of the stent graft appears well apposed against the walls of the infrarenal abdominal aorta, however there is unchanged focal outpouching involving the posterior aspect of the infrarenal abdominal aorta immediately cranial to the proximal landing zone of the endovascular stent. The focal outpouching is unchanged in size measuring approximately 3.0 x 1.4 cm (sagittal image 80, series 602). The distal limbs of the  endovascular stent are well apposed against the walls of the bilateral common iliac arteries.  There is persistent enhancement of the excluded native abdominal aortic aneurysm sac via a type 2 endoleak supplied by the right L3 lumbar artery (representative coronal images 66 - 70, series 601; axial image 64 and 65, series 5) as well as via the still patent IMA (representative axial images 58 through 66, series 5).  This finding has been associated with continued minimal growth of the excluded native abdominal aorta, now measuring approximately 5.2 cm in greatest oblique coronal dimension (coronal image 58, series 601), previously, 5.0 cm and approximately 5.6 cm in greatest oblique sagittal dimension (sagittal image 86, series 602, previously, 5.4 cm.  Celiac artery: There is a minimal amount of eccentric calcified plaque involving the origin of the celiac artery, not resulting in hemodynamically significant stenosis. There is unchanged eccentric mixed calcified and noncalcified atherosclerotic plaque involving the distal aspect of the main trunk of the celiac artery extending to involve the proximal aspect of the splenic artery with focal ectasia at this location  with the distal celiac trunk measuring approximately 1.3 cm in greatest oblique axial dimension (image 26, series 5 in the proximal aspect of the splenic artery measuring approximately 1.1 cm (image 31). The remainder of the splenic artery is of normal caliber without aneurysmal dilatation. Conventional branching pattern.  SMA: There is a minimal amount of eccentric mixed calcified and noncalcified atherosclerotic plaque involving the origin of the SMA, not resulting in hemodynamically significant stenosis. The distal tributaries of the SMA are widely patent without discrete intraluminal filling defect to suggest distal embolism.  Right Renal artery: Solitary ; there is a minimal amount of eccentric mixed calcified and noncalcified atherosclerotic plaque involving the origin of the right renal artery, not resulting in hemodynamically significant stenosis.  Left Renal artery: Solitary; there is unchanged ectasia involving the origin of the left renal artery measuring approximately 1.1 cm in greatest oblique coronal dimension (image 68, series 601). There is a unchanged nonocclusive linear filling defect within the origin of the left renal artery (coronal image 67, series 601), not resulting in hemodynamically significant stenosis though favored to represent a short segment dissection.  IMA: As above, the IMA remains patent and appears to contribute to the type 2 endoleak.  Right-sided pelvic vasculature: The right limb of the endovascular stent is well apposed against the walls of the distal aspect of the right common iliac artery. There is complete exclusion of the right common iliac artery aneurysm which is unchanged in size measuring approximately 3.7 cm in maximal oblique axial dimension (axial image 81, series 5). The right internal iliac artery is disease though patent and of normal caliber. The right external iliac artery is tortuous but widely patent without hemodynamically significant narrowing.  Left-sided  pelvic vasculature: The left a limb of the endovascular stent is well apposed against the walls of the distal aspect of the common iliac artery. The left internal iliac artery is diseased though patent and of normal caliber. The left external iliac artery is tortuous though widely patent without hemodynamically significant stenosis.  Review of the MIP images confirms the above findings.   --------------------------------------------------------------------------------  Nonvascular Findings:  Limited visualization of the lower thorax demonstrates minimal grossly symmetric subpleural ground-glass atelectasis. No discrete focal airspace opacities. No pleural effusion. Note is again made of a mesenteric fat containing diaphragmatic hernia.  Normal hepatic contour. No discrete hepatic lesions. Post cholecystectomy. No intra extrahepatic biliary duct dilatation. No ascites.  There is symmetric enhancement and excretion of the bilateral kidneys. Right-sided renal cysts appear grossly unchanged. Additional bilateral sub cm hypo attenuating renal lesions are too small to adequately characterize of favored to represent additional renal cysts. No renal stones. No urinary destruction or perinephric stranding.  Normal appearance of the bilateral adrenal glands, pancreas and spleen.  Extensive colonic diverticulosis without evidence of diverticulitis. Postsurgical change of the mid transverse colon. Normal appearance of the retrocecal appendix. Moderate colonic stool burden without evidence of enteric obstruction. No pneumoperitoneum, pneumatosis or portal venous gas.  No retroperitoneal, mesenteric, pelvic or inguinal lymphadenopathy.  The prostate is enlarged with mass effect upon the undersurface of the urinary bladder. Normal appearance of the urinary bladder given degree distention. No free fluid in the pelvic cul-de-sac.  Similar appearance of interbody fusion of L1-L2. Re- demonstrated moderate scoliotic curvature of the  thoracolumbar spine with dominant caudal component convex to the left. No anterolisthesis or retrolisthesis.  Regional soft tissues appear normal.  IMPRESSION: Vascular Impression:  1. Post endovascular repair of infrarenal abdominal aortic aneurysm with persistent findings of a type 2 endoleak supplied via the right L3 lumbar artery and the IMA. These findings are associated with minimal interval enlargement of the native abdominal aortic aneurysm sac, now measuring approximately 5.6 cm in greatest oblique sagittal dimension, previously, 5.4 cm. No periaortic stranding. 2. The proximal end of the endovascular stent is well apposed against the walls of the infrarenal abdominal aorta though there is unchanged focal outpouching involving primarily the posterior wall of the perirenal abdominal aorta immediately cranial to the landing zone of the stent. Again, there is no evidence of a type 1 endoleak. 3. Unchanged focal ectasia involving the origin and proximal aspect of the left renal artery with associated non flow limiting short segment dissection. 4. 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 Nonvascular Impression:  1. Extensive colonic diverticulosis without evidence of diverticulitis. 2. Stable postsurgical change of the mid aspect of the transverse colon. Moderate colonic stool burden without evidence of enteric obstruction. 3. Prostatomegaly with mass effect on the undersurface of the urinary bladder.   Electronically Signed   By: Sandi Mariscal M.D.   On: 12/11/2014 15:46    Labs:  CBC:  Recent Labs  03/30/14 0920 05/19/14 0927  WBC 7.3 8.2  HGB 13.4 13.5  HCT 39.4 38.6  PLT 203 193    COAGS: No results for input(s): INR, APTT in the last 8760 hours.  BMP:  Recent Labs  03/30/14 0920 05/19/14 0927 12/01/14 1017  NA 142 140  --   K 4.1 4.4  --   CL 105 104  --   CO2 25 21  --   GLUCOSE 98 96  --   BUN 20 19 23   CALCIUM 9.2 9.3  --   CREATININE 1.46* 1.41* 1.50*    GFRNONAA 43* 45*  --   GFRAA 50* 52*  --     LIVER FUNCTION TESTS:  Recent Labs  03/30/14 0920 05/19/14 0927  BILITOT 0.4 0.4  AST 25 21  ALT 19 18  ALKPHOS 102 75  PROT 6.9 6.8    Assessment and Plan:  Andrew Vega is a very pleasant 81 year old male who is functionally younger than his stated age. He has evidence of enlarging abdominal aortic aneurysm sac secondary to type II endoleak despite well-positioned endovascular aortic repair.  There is a visible communication between the right internal iliac artery and the right L3 lumbar artery via the descending  iliolumbar arcade. I think there is a good chance at successful endovascular repair of his endoleak. I suspect the right L3 lumbar artery is the afferent source and the IMA may represent the outflow. There is very little contrast opacification within the inferior mesenteric artery to make me suspect that this is a source of inflow. However, this will be investigated at the time of angiography.  If endovascular repair is unsuccessful, Andrew Vega would also be a candidate for percutaneous puncture and endoleak repair as secondary option.  The risks (including vascular injury, bleeding, infection, nontarget embolization and continued aneurysm growth despite repair), benefits and alternatives were discussed at length with Andrew Vega. He voiced his understanding and wishes to proceed.  1.) We will schedule for attempted endovascular repair of type II endoleak to be performed at [facility] under moderate sedation in the near future.  Thank you for this interesting consult.  I greatly enjoyed meeting Andrew Vega and look forward to participating in their care.  SignedJacqulynn Cadet 12/24/2014, 9:29 AM   I spent a total of  30 Minutes   in face to face in clinical consultation, greater than 50% of which was counseling/coordinating care for type 2 endoleak.

## 2014-12-27 ENCOUNTER — Other Ambulatory Visit: Payer: Self-pay | Admitting: Internal Medicine

## 2015-01-04 ENCOUNTER — Other Ambulatory Visit (HOSPITAL_COMMUNITY): Payer: Self-pay | Admitting: Interventional Radiology

## 2015-01-04 DIAGNOSIS — I714 Abdominal aortic aneurysm, without rupture, unspecified: Secondary | ICD-10-CM

## 2015-01-06 ENCOUNTER — Other Ambulatory Visit: Payer: Self-pay | Admitting: Radiology

## 2015-01-08 ENCOUNTER — Other Ambulatory Visit (HOSPITAL_COMMUNITY): Payer: Self-pay | Admitting: Interventional Radiology

## 2015-01-08 ENCOUNTER — Observation Stay (HOSPITAL_COMMUNITY)
Admission: AD | Admit: 2015-01-08 | Discharge: 2015-01-09 | Disposition: A | Discharge status: Home / Self Care | Payer: Medicare Other | Source: Ambulatory Visit | Attending: Interventional Radiology | Admitting: Interventional Radiology

## 2015-01-08 ENCOUNTER — Encounter (HOSPITAL_COMMUNITY): Payer: Self-pay | Admitting: General Practice

## 2015-01-08 ENCOUNTER — Ambulatory Visit (HOSPITAL_COMMUNITY)
Admission: RE | Admit: 2015-01-08 | Discharge: 2015-01-08 | Disposition: A | Discharge status: Home / Self Care | Payer: Medicare Other | Source: Ambulatory Visit | Attending: Interventional Radiology | Admitting: Interventional Radiology

## 2015-01-08 ENCOUNTER — Encounter (HOSPITAL_COMMUNITY): Payer: Self-pay

## 2015-01-08 DIAGNOSIS — Z79899 Other long term (current) drug therapy: Secondary | ICD-10-CM | POA: Diagnosis not present

## 2015-01-08 DIAGNOSIS — I714 Abdominal aortic aneurysm, without rupture, unspecified: Secondary | ICD-10-CM

## 2015-01-08 DIAGNOSIS — IMO0002 Reserved for concepts with insufficient information to code with codable children: Secondary | ICD-10-CM | POA: Diagnosis present

## 2015-01-08 DIAGNOSIS — K5730 Diverticulosis of large intestine without perforation or abscess without bleeding: Secondary | ICD-10-CM | POA: Insufficient documentation

## 2015-01-08 DIAGNOSIS — E785 Hyperlipidemia, unspecified: Secondary | ICD-10-CM | POA: Insufficient documentation

## 2015-01-08 DIAGNOSIS — G4700 Insomnia, unspecified: Secondary | ICD-10-CM | POA: Diagnosis not present

## 2015-01-08 DIAGNOSIS — J449 Chronic obstructive pulmonary disease, unspecified: Secondary | ICD-10-CM | POA: Insufficient documentation

## 2015-01-08 DIAGNOSIS — F419 Anxiety disorder, unspecified: Secondary | ICD-10-CM | POA: Insufficient documentation

## 2015-01-08 DIAGNOSIS — T82330A Leakage of aortic (bifurcation) graft (replacement), initial encounter: Principal | ICD-10-CM | POA: Insufficient documentation

## 2015-01-08 DIAGNOSIS — Z7982 Long term (current) use of aspirin: Secondary | ICD-10-CM | POA: Insufficient documentation

## 2015-01-08 DIAGNOSIS — Z87891 Personal history of nicotine dependence: Secondary | ICD-10-CM | POA: Diagnosis not present

## 2015-01-08 LAB — BASIC METABOLIC PANEL
ANION GAP: 6 (ref 5–15)
BUN: 15 mg/dL (ref 6–23)
CALCIUM: 9.3 mg/dL (ref 8.4–10.5)
CO2: 25 mmol/L (ref 19–32)
Chloride: 108 mmol/L (ref 96–112)
Creatinine, Ser: 1.5 mg/dL — ABNORMAL HIGH (ref 0.50–1.35)
GFR, EST AFRICAN AMERICAN: 47 mL/min — AB (ref >90)
GFR, EST NON AFRICAN AMERICAN: 40 mL/min — AB (ref >90)
Glucose, Bld: 100 mg/dL — ABNORMAL HIGH (ref 70–99)
POTASSIUM: 4.7 mmol/L (ref 3.5–5.1)
SODIUM: 139 mmol/L (ref 135–145)

## 2015-01-08 LAB — CBC WITH DIFFERENTIAL/PLATELET
Basophils Absolute: 0.1 10*3/uL (ref 0.0–0.1)
Basophils Relative: 1 % (ref 0–1)
Eosinophils Absolute: 0.4 10*3/uL (ref 0.0–0.7)
Eosinophils Relative: 7 % — ABNORMAL HIGH (ref 0–5)
HCT: 39.1 % (ref 39.0–52.0)
Hemoglobin: 13.7 g/dL (ref 13.0–17.0)
Lymphocytes Relative: 28 % (ref 12–46)
Lymphs Abs: 1.8 10*3/uL (ref 0.7–4.0)
MCH: 31.3 pg (ref 26.0–34.0)
MCHC: 35 g/dL (ref 30.0–36.0)
MCV: 89.3 fL (ref 78.0–100.0)
MONOS PCT: 10 % (ref 3–12)
Monocytes Absolute: 0.6 10*3/uL (ref 0.1–1.0)
NEUTROS PCT: 54 % (ref 43–77)
Neutro Abs: 3.5 10*3/uL (ref 1.7–7.7)
PLATELETS: 164 10*3/uL (ref 150–400)
RBC: 4.38 MIL/uL (ref 4.22–5.81)
RDW: 13.4 % (ref 11.5–15.5)
WBC: 6.3 10*3/uL (ref 4.0–10.5)

## 2015-01-08 LAB — PROTIME-INR
INR: 1.13 (ref 0.00–1.49)
PROTHROMBIN TIME: 14.6 s (ref 11.6–15.2)

## 2015-01-08 LAB — APTT: aPTT: 31 seconds (ref 24–37)

## 2015-01-08 MED ORDER — SODIUM CHLORIDE 0.9 % IJ SOLN
3.0000 mL | Freq: Two times a day (BID) | INTRAMUSCULAR | Status: DC
  Administered 2015-01-08: 3 mL via INTRAVENOUS

## 2015-01-08 MED ORDER — IOHEXOL 300 MG/ML  SOLN
150.0000 mL | Freq: Once | INTRAMUSCULAR | Status: AC | PRN
  Administered 2015-01-08: 150 mL via INTRA_ARTERIAL

## 2015-01-08 MED ORDER — LIDOCAINE HCL 1 % IJ SOLN
INTRAMUSCULAR | Status: AC
Start: 2015-01-08 — End: 2015-01-08
  Filled 2015-01-08: qty 20

## 2015-01-08 MED ORDER — MIDAZOLAM HCL 2 MG/2ML IJ SOLN
INTRAMUSCULAR | Status: DC | PRN
  Administered 2015-01-08: 0.5 mg via INTRAVENOUS
  Administered 2015-01-08: 1 mg via INTRAVENOUS
  Administered 2015-01-08: 0.5 mg via INTRAVENOUS
  Administered 2015-01-08: 1 mg via INTRAVENOUS
  Administered 2015-01-08 (×4): 0.5 mg via INTRAVENOUS
  Administered 2015-01-08: 1 mg via INTRAVENOUS

## 2015-01-08 MED ORDER — DOCUSATE SODIUM 100 MG PO CAPS: 100.0000 mg | ORAL_CAPSULE | Freq: Two times a day (BID) | ORAL | Status: DC

## 2015-01-08 MED ORDER — PROMETHAZINE HCL 25 MG PO TABS: 25.0000 mg | ORAL_TABLET | Freq: Three times a day (TID) | ORAL | Status: DC | PRN

## 2015-01-08 MED ORDER — MIDAZOLAM HCL 2 MG/2ML IJ SOLN
INTRAMUSCULAR | Status: AC
  Filled 2015-01-08: qty 4

## 2015-01-08 MED ORDER — FENTANYL CITRATE 0.05 MG/ML IJ SOLN
INTRAMUSCULAR | Status: AC
  Filled 2015-01-08: qty 2

## 2015-01-08 MED ORDER — HYDROCODONE-ACETAMINOPHEN 5-325 MG PO TABS: 1.0000 | ORAL_TABLET | ORAL | Status: DC | PRN

## 2015-01-08 MED ORDER — FENTANYL CITRATE 0.05 MG/ML IJ SOLN
INTRAMUSCULAR | Status: DC | PRN
  Administered 2015-01-08 (×7): 25 ug via INTRAVENOUS
  Administered 2015-01-08: 50 ug via INTRAVENOUS
  Administered 2015-01-08 (×3): 25 ug via INTRAVENOUS

## 2015-01-08 MED ORDER — SODIUM CHLORIDE 0.9 % IJ SOLN: 3.0000 mL | INTRAMUSCULAR | Status: DC | PRN

## 2015-01-08 MED ORDER — PROMETHAZINE HCL 25 MG RE SUPP: 25.0000 mg | Freq: Three times a day (TID) | RECTAL | Status: DC | PRN

## 2015-01-08 MED ORDER — HEPARIN SODIUM (PORCINE) 1000 UNIT/ML IJ SOLN
INTRAMUSCULAR | Status: AC
  Filled 2015-01-08: qty 1

## 2015-01-08 MED ORDER — SODIUM CHLORIDE 0.9 % IV SOLN
Freq: Once | INTRAVENOUS | Status: AC
  Administered 2015-01-08: 10:00:00 via INTRAVENOUS

## 2015-01-08 MED ORDER — SODIUM CHLORIDE 0.9 % IV SOLN
INTRAVENOUS | Status: AC
  Administered 2015-01-08: 23:00:00 via INTRAVENOUS

## 2015-01-08 MED ORDER — MIDAZOLAM HCL 2 MG/2ML IJ SOLN
INTRAMUSCULAR | Status: AC
  Filled 2015-01-08: qty 2

## 2015-01-08 MED ORDER — SODIUM CHLORIDE 0.9 % IV SOLN: 250.0000 mL | INTRAVENOUS | Status: DC | PRN

## 2015-01-08 MED ORDER — ONDANSETRON HCL 4 MG/2ML IJ SOLN: 4.0000 mg | Freq: Four times a day (QID) | INTRAMUSCULAR | Status: DC | PRN

## 2015-01-08 MED ORDER — SODIUM CHLORIDE 0.9 % IJ SOLN: 3.0000 mL | Freq: Two times a day (BID) | INTRAMUSCULAR | Status: DC

## 2015-01-08 MED ORDER — ONDANSETRON HCL 4 MG/2ML IJ SOLN
4.0000 mg | Freq: Four times a day (QID) | INTRAMUSCULAR | Status: DC | PRN
  Administered 2015-01-08: 4 mg via INTRAVENOUS
  Filled 2015-01-08: qty 2

## 2015-01-08 MED ORDER — DOCUSATE SODIUM 100 MG PO CAPS
100.0000 mg | ORAL_CAPSULE | Freq: Two times a day (BID) | ORAL | Status: DC
  Administered 2015-01-08 – 2015-01-09 (×2): 100 mg via ORAL
  Filled 2015-01-08 (×4): qty 1

## 2015-01-08 MED ORDER — SODIUM CHLORIDE 0.9 % IV SOLN
INTRAVENOUS | Status: AC
  Administered 2015-01-08: 19:00:00 via INTRAVENOUS

## 2015-01-08 MED ORDER — FENTANYL CITRATE 0.05 MG/ML IJ SOLN
INTRAMUSCULAR | Status: AC
  Filled 2015-01-08: qty 4

## 2015-01-08 NOTE — Procedures (Signed)
Interventional Radiology Procedure Note  Procedure:  Endovascular repair of Type two endoleak.  Onyx fill of aneurysm sac, coil embo lumbar artery and IMA.  Access: Right CFA, 13F  Complications: None immediate  Estimated Blood Loss: < 25 mL  Recommendations: - Admit for obs - Bedrest x 4 hrs - DC foley 6 hrs   Signed,  Criselda Peaches, MD

## 2015-01-08 NOTE — Sedation Documentation (Signed)
Will place foley catheter per MD.

## 2015-01-08 NOTE — Progress Notes (Signed)
Pt admitted to the unit at 1800. Pt mental status is A&OX4. Pt oriented to room, staff, and call bell. Skin is intact other than surgical incision to R groin. Full assessment charted in [facility]. Call bell within reach. Visitor guidelines reviewed w/ pt and/or family.

## 2015-01-08 NOTE — Consult Note (Signed)
Chief Complaint: Type II endoleak  Referring Physician(s): Dr Trula Slade  History of Present Illness: Andrew Vega is a 81 y.o. male   Pt with known abdominal aorta aneurysm  Endovascular Aorta repair/stent 10/29/13 Routine follow up imaging did reveal type II endoleak 6 month follow up imaging revealed persistent leak with enlargement of excluded aneurysmal sac Pt remains asymptomatic Consulted with Dr Laurence Ferrari regarding endovascular endoleak repair Now scheduled for same  Past Medical History  Diagnosis Date  . Anxiety   . Hyperlipidemia   . Abdominal aneurysm without mention of rupture   . Insomnia, unspecified   . Colon polyps   . Arthritis   . Diverticulosis of colon (without mention of hemorrhage)   . COPD (chronic obstructive pulmonary disease)   . Inguinal hernia   . Cancer     skin cancer removal  . Anginal pain     recent hospitalization  . H/O hiatal hernia     Past Surgical History  Procedure Laterality Date  . Vasectomy  1962  . Cholecystectomy  1962  . Colon surgery  2007  . Bilateral shoulder repair    . Nasal septum surgery  2011  . Hernia repair    . Abdominal aortic endovascular stent graft N/A 10/29/2013    Procedure: ABDOMINAL AORTIC ENDOVASCULAR STENT GRAFT;  Surgeon: [name], MD;  Location: [facility] OR;  Service: Vascular;  Laterality: N/A;    Allergies: Celebrex and Codeine  Medications: Prior to Admission medications   Medication Sig Start Date End Date Taking? Authorizing Provider  aspirin 81 MG tablet Take 81 mg by mouth daily.   Yes Historical Provider, MD  B Complex-C (SUPER B COMPLEX PO) Take 1 tablet by mouth daily.    Yes Historical Provider, MD  chlorpheniramine (CHLOR-TRIMETON) 4 MG tablet Take 4-8 mg by mouth every 6 (six) hours as needed for allergies.    Yes Historical Provider, MD  Cholecalciferol (VITAMIN D) 2000 UNITS CAPS Take 2,000 Units by mouth daily.    Yes Historical Provider, MD  citalopram (CELEXA) 40 MG  tablet Take 1 tablet (40 mg total) by mouth daily. 12/03/14  Yes Tiffany L Reed, DO  fexofenadine (ALLEGRA) 180 MG tablet Take 1 tablet (180 mg total) by mouth daily. 12/03/14  Yes Tiffany L Reed, DO  Glucosamine-Chondroit-Vit C-Mn (GLUCOSAMINE CHONDR 1500 COMPLX PO) Take 1 capsule by mouth daily.   Yes Historical Provider, MD  LORazepam (ATIVAN) 1 MG tablet TAKE 1/2 TABLET BY MOUTH EVERY DAY AND 1 EVERY NIGHT AT BEDTIME. 12/29/14  Yes Tiffany L Reed, DO  Melatonin 3 MG TABS Take 3 tablets by mouth at bedtime.    Yes Historical Provider, MD  Multiple Vitamin (MULTIVITAMIN) tablet Take 1 tablet by mouth daily.   Yes Historical Provider, MD  niacin 100 MG tablet Take 250 mg by mouth daily with breakfast.    Yes Historical Provider, MD  Omega-3 Fatty Acids (FISH OIL) 1000 MG CAPS Take 1 capsule by mouth daily.   Yes Historical Provider, MD  OVER THE COUNTER MEDICATION Place 1 drop into both eyes every morning.   Yes Historical Provider, MD  simvastatin (ZOCOR) 40 MG tablet TAKE 1 TABLET BY MOUTH AT BEDTIME FOR CHOLESTEROL   Yes Tiffany L Reed, DO  vitamin E 400 UNIT capsule Take 400 Units by mouth daily.   Yes Historical Provider, MD     Family History  Problem Relation Age of Onset  . Colon cancer Neg Hx   . Heart attack Father  29    History   Social History  . Marital Status: Married    Spouse Name: N/A  . Number of Children: N/A  . Years of Education: N/A   Occupational History  . retired    Social History Main Topics  . Smoking status: Former Smoker -- 0.75 packs/day for 25 years    Types: Cigarettes    Quit date: 10/23/1984  . Smokeless tobacco: Never Used  . Alcohol Use: 0.6 oz/week    1 Glasses of wine per week     Comment: social  . Drug Use: No  . Sexual Activity: No   Other Topics Concern  . None   Social History Narrative    Review of Systems: A 12 point ROS discussed and pertinent positives are indicated in the HPI above.  All other systems are negative.  Review  of Systems  Constitutional: Negative for fever, activity change and unexpected weight change.  HENT: Positive for postnasal drip.   Respiratory: Negative for cough and shortness of breath.   Cardiovascular: Negative for chest pain.  Gastrointestinal: Negative for abdominal pain.  Genitourinary: Negative for difficulty urinating.  Musculoskeletal: Negative for back pain and gait problem.  Neurological: Negative for weakness.  Psychiatric/Behavioral: Negative for behavioral problems and confusion.    Vital Signs: BP 140/75 mmHg  Pulse 80  Temp(Src) 97.6 F (36.4 C) (Oral)  Resp 18  Ht 5\' 8"  (1.727 m)  Wt 88.451 kg (195 lb)  BMI 29.66 kg/m2  SpO2 97%  Physical Exam  Constitutional: He is oriented to person, place, and time. He appears well-developed and well-nourished.  Cardiovascular: Normal rate, regular rhythm and normal heart sounds.   No murmur heard. Pulmonary/Chest: Effort normal and breath sounds normal. He has no wheezes.  Abdominal: Soft. Bowel sounds are normal. He exhibits no distension. There is no tenderness.  Musculoskeletal: Normal range of motion.  Neurological: He is alert and oriented to person, place, and time.  Skin: Skin is warm and dry.  Psychiatric: He has a normal mood and affect. His behavior is normal. Judgment and thought content normal.  Nursing note and vitals reviewed.   Mallampati Score:  MD Evaluation Airway: WNL Heart: WNL Abdomen: WNL Chest/ Lungs: WNL ASA  Classification: 3 Mallampati/Airway Score: One  Imaging: Ct Angio Abd/pel W/ And/or W/o  12/11/2014   CLINICAL DATA:  Post EVAR (10/29/2013).  EXAM: CTA ABDOMEN AND PELVIS WITHOUT CONTRAST  TECHNIQUE: Multidetector CT imaging of the abdomen and pelvis was performed using the standard protocol during bolus administration of intravenous contrast. Multiplanar reconstructed images and MIPs were obtained and reviewed to evaluate the vascular anatomy.  CONTRAST:  63mL OMNIPAQUE IOHEXOL 350  MG/ML SOLN  COMPARISON:  CT abdomen pelvis - 06/01/2014; 12/01/2013.  FINDINGS: Vascular Findings:  Abdominal aorta: Post endovascular repair of infrarenal abdominal aortic aneurysm. The endovascular stent remains widely patent.  The proximal end of the stent graft appears well apposed against the walls of the infrarenal abdominal aorta, however there is unchanged focal outpouching involving the posterior aspect of the infrarenal abdominal aorta immediately cranial to the proximal landing zone of the endovascular stent. The focal outpouching is unchanged in size measuring approximately 3.0 x 1.4 cm (sagittal image 80, series 602). The distal limbs of the endovascular stent are well apposed against the walls of the bilateral common iliac arteries.  There is persistent enhancement of the excluded native abdominal aortic aneurysm sac via a type 2 endoleak supplied by the right L3 lumbar artery (representative coronal  images 66 - 70, series 601; axial image 64 and 65, series 5) as well as via the still patent IMA (representative axial images 58 through 66, series 5).  This finding has been associated with continued minimal growth of the excluded native abdominal aorta, now measuring approximately 5.2 cm in greatest oblique coronal dimension (coronal image 58, series 601), previously, 5.0 cm and approximately 5.6 cm in greatest oblique sagittal dimension (sagittal image 86, series 602, previously, 5.4 cm.  Celiac artery: There is a minimal amount of eccentric calcified plaque involving the origin of the celiac artery, not resulting in hemodynamically significant stenosis. There is unchanged eccentric mixed calcified and noncalcified atherosclerotic plaque involving the distal aspect of the main trunk of the celiac artery extending to involve the proximal aspect of the splenic artery with focal ectasia at this location with the distal celiac trunk measuring approximately 1.3 cm in greatest oblique axial dimension (image  26, series 5 in the proximal aspect of the splenic artery measuring approximately 1.1 cm (image 31). The remainder of the splenic artery is of normal caliber without aneurysmal dilatation. Conventional branching pattern.  SMA: There is a minimal amount of eccentric mixed calcified and noncalcified atherosclerotic plaque involving the origin of the SMA, not resulting in hemodynamically significant stenosis. The distal tributaries of the SMA are widely patent without discrete intraluminal filling defect to suggest distal embolism.  Right Renal artery: Solitary ; there is a minimal amount of eccentric mixed calcified and noncalcified atherosclerotic plaque involving the origin of the right renal artery, not resulting in hemodynamically significant stenosis.  Left Renal artery: Solitary; there is unchanged ectasia involving the origin of the left renal artery measuring approximately 1.1 cm in greatest oblique coronal dimension (image 68, series 601). There is a unchanged nonocclusive linear filling defect within the origin of the left renal artery (coronal image 67, series 601), not resulting in hemodynamically significant stenosis though favored to represent a short segment dissection.  IMA: As above, the IMA remains patent and appears to contribute to the type 2 endoleak.  Right-sided pelvic vasculature: The right limb of the endovascular stent is well apposed against the walls of the distal aspect of the right common iliac artery. There is complete exclusion of the right common iliac artery aneurysm which is unchanged in size measuring approximately 3.7 cm in maximal oblique axial dimension (axial image 81, series 5). The right internal iliac artery is disease though patent and of normal caliber. The right external iliac artery is tortuous but widely patent without hemodynamically significant narrowing.  Left-sided pelvic vasculature: The left a limb of the endovascular stent is well apposed against the walls of the  distal aspect of the common iliac artery. The left internal iliac artery is diseased though patent and of normal caliber. The left external iliac artery is tortuous though widely patent without hemodynamically significant stenosis.  Review of the MIP images confirms the above findings.   --------------------------------------------------------------------------------  Nonvascular Findings:  Limited visualization of the lower thorax demonstrates minimal grossly symmetric subpleural ground-glass atelectasis. No discrete focal airspace opacities. No pleural effusion. Note is again made of a mesenteric fat containing diaphragmatic hernia.  Normal hepatic contour. No discrete hepatic lesions. Post cholecystectomy. No intra extrahepatic biliary duct dilatation. No ascites.  There is symmetric enhancement and excretion of the bilateral kidneys. Right-sided renal cysts appear grossly unchanged. Additional bilateral sub cm hypo attenuating renal lesions are too small to adequately characterize of favored to represent additional renal cysts. No renal stones. No  urinary destruction or perinephric stranding.  Normal appearance of the bilateral adrenal glands, pancreas and spleen.  Extensive colonic diverticulosis without evidence of diverticulitis. Postsurgical change of the mid transverse colon. Normal appearance of the retrocecal appendix. Moderate colonic stool burden without evidence of enteric obstruction. No pneumoperitoneum, pneumatosis or portal venous gas.  No retroperitoneal, mesenteric, pelvic or inguinal lymphadenopathy.  The prostate is enlarged with mass effect upon the undersurface of the urinary bladder. Normal appearance of the urinary bladder given degree distention. No free fluid in the pelvic cul-de-sac.  Similar appearance of interbody fusion of L1-L2. Re- demonstrated moderate scoliotic curvature of the thoracolumbar spine with dominant caudal component convex to the left. No anterolisthesis or  retrolisthesis.  Regional soft tissues appear normal.  IMPRESSION: Vascular Impression:  1. Post endovascular repair of infrarenal abdominal aortic aneurysm with persistent findings of a type 2 endoleak supplied via the right L3 lumbar artery and the IMA. These findings are associated with minimal interval enlargement of the native abdominal aortic aneurysm sac, now measuring approximately 5.6 cm in greatest oblique sagittal dimension, previously, 5.4 cm. No periaortic stranding. 2. The proximal end of the endovascular stent is well apposed against the walls of the infrarenal abdominal aorta though there is unchanged focal outpouching involving primarily the posterior wall of the perirenal abdominal aorta immediately cranial to the landing zone of the stent. Again, there is no evidence of a type 1 endoleak. 3. Unchanged focal ectasia involving the origin and proximal aspect of the left renal artery with associated non flow limiting short segment dissection. 4. 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 Nonvascular Impression:  1. Extensive colonic diverticulosis without evidence of diverticulitis. 2. Stable postsurgical change of the mid aspect of the transverse colon. Moderate colonic stool burden without evidence of enteric obstruction. 3. Prostatomegaly with mass effect on the undersurface of the urinary bladder.   Electronically Signed   By: Sandi Mariscal M.D.   On: 12/11/2014 15:46    Labs:  CBC:  Recent Labs  03/30/14 0920 05/19/14 0927  WBC 7.3 8.2  HGB 13.4 13.5  HCT 39.4 38.6  PLT 203 193    COAGS: No results for input(s): INR, APTT in the last 8760 hours.  BMP:  Recent Labs  03/30/14 0920 05/19/14 0927 12/01/14 1017  NA 142 140  --   K 4.1 4.4  --   CL 105 104  --   CO2 25 21  --   GLUCOSE 98 96  --   BUN 20 19 23   CALCIUM 9.2 9.3  --   CREATININE 1.46* 1.41* 1.50*  GFRNONAA 43* 45*  --   GFRAA 50* 52*  --     LIVER FUNCTION TESTS:  Recent Labs   03/30/14 0920 05/19/14 0927  BILITOT 0.4 0.4  AST 25 21  ALT 19 18  ALKPHOS 102 75  PROT 6.9 6.8    TUMOR MARKERS: No results for input(s): AFPTM, CEA, CA199, CHROMGRNA in the last 8760 hours.  Assessment and Plan:  Known AAA repair with endovascular stent placement 10/29/2013 Type II asymptomatic endoleak Now scheduled for endovascular endoleak repair using coiling/onyx Pt aware of procedure benefits and risks including but not limited to: Infection; bleeding; vascular damage; damage to surrounding structures; unsuccessful repair- need for further studies and/or surgery; death Agreeable to proceed Consent signed andin chart  Thank you for this interesting consult.  I greatly enjoyed meeting Andrew Vega and look forward to participating in their care.  Signed: Monia Sabal A  01/08/2015, 10:34 AM   I spent a total of  20 Minutes   in face to face in clinical consultation, greater than 50% of which was counseling/coordinating care for Aortagram with AAA endoleak repair

## 2015-01-08 NOTE — Sedation Documentation (Signed)
Pts sats dropping into low 80's- 100% NRB mask applied- tilted pts head up and tactile stimulation given.

## 2015-01-08 NOTE — Sedation Documentation (Signed)
Pt c/o 02 bothering him- removed for now. Sats remaining 93-95 R AIR, Will continue to monitor

## 2015-01-08 NOTE — Sedation Documentation (Signed)
Pts sats improved- transferred back to [place] 4L for now.

## 2015-01-09 ENCOUNTER — Other Ambulatory Visit: Payer: Self-pay | Admitting: Radiology

## 2015-01-09 DIAGNOSIS — F419 Anxiety disorder, unspecified: Secondary | ICD-10-CM | POA: Diagnosis not present

## 2015-01-09 DIAGNOSIS — I714 Abdominal aortic aneurysm, without rupture: Secondary | ICD-10-CM | POA: Diagnosis not present

## 2015-01-09 DIAGNOSIS — E785 Hyperlipidemia, unspecified: Secondary | ICD-10-CM | POA: Diagnosis not present

## 2015-01-09 DIAGNOSIS — J449 Chronic obstructive pulmonary disease, unspecified: Secondary | ICD-10-CM | POA: Diagnosis not present

## 2015-01-09 DIAGNOSIS — T82330A Leakage of aortic (bifurcation) graft (replacement), initial encounter: Secondary | ICD-10-CM | POA: Diagnosis not present

## 2015-01-09 DIAGNOSIS — Z7982 Long term (current) use of aspirin: Secondary | ICD-10-CM | POA: Diagnosis not present

## 2015-01-09 DIAGNOSIS — IMO0001 Reserved for inherently not codable concepts without codable children: Secondary | ICD-10-CM

## 2015-01-09 DIAGNOSIS — T82330D Leakage of aortic (bifurcation) graft (replacement), subsequent encounter: Secondary | ICD-10-CM

## 2015-01-09 NOTE — Discharge Summary (Signed)
Physician Discharge Summary  Patient ID: Andrew Vega MRN: [id number] DOB/AGE: November 21, 1925 81 y.o.  Admit date: 01/08/2015 Discharge date: 01/09/2015  Admission Diagnoses: Active Problems:   Endoleak post (EVAR) endovascular aneurysm repair  Discharge Diagnoses:  Active Problems:   Endoleak post (EVAR) endovascular aneurysm repair    Procedures: S/p Endovascular repair of Type two endoleak. Onyx fill of aneurysm sac, coil embo lumbar artery and IMA  Discharged Condition: good  Hospital Course: Pt with known Endo leak of previous Endovascular Stenting of AAA. Scheduled for Endorepair with Dr. Laurence Ferrari Pt brought to IR suite and underwent successful embolization on 3/18, see procedure note for full details. No immediate complications. Pt then admitted to the floor in stable condition for observation. No overnight issues. POD #1 exam and assessment is that pt is stable for discharge. All instructions, medications, and follow up plans reviewed.  Consults: None   Discharge Exam: Blood pressure 150/78, pulse 72, temperature 98.1 F (36.7 C), temperature source Oral, resp. rate 18, height 5\' 8"  (1.727 m), weight 192 lb 4.8 oz (87.227 kg), SpO2 94 %. Gen: NAD Lungs: CTA without w/r/r Heart: Regular Abdomen: soft, ND, NT Ext: (R)groin soft, no hematoma. Fett warm with intact pulses bilaterally.   Disposition: 01-Home or Self Care  Discharge Instructions    Call MD for:  difficulty breathing, headache or visual disturbances    Complete by:  As directed      Call MD for:  persistant nausea and vomiting    Complete by:  As directed      Call MD for:  redness, tenderness, or signs of infection (pain, swelling, redness, odor or green/yellow discharge around incision site)    Complete by:  As directed      Call MD for:  severe uncontrolled pain    Complete by:  As directed      Call MD for:  temperature >100.4    Complete by:  As directed      Diet - low sodium heart healthy     Complete by:  As directed      Increase activity slowly    Complete by:  As directed      May shower / Bathe    Complete by:  As directed      Remove dressing in 24 hours    Complete by:  As directed             Medication List    TAKE these medications        aspirin 81 MG tablet  Take 81 mg by mouth daily.     CHLOR-TRIMETON 4 MG tablet  Generic drug:  chlorpheniramine  Take 4-8 mg by mouth every 6 (six) hours as needed for allergies.     citalopram 40 MG tablet  Commonly known as:  CELEXA  Take 1 tablet (40 mg total) by mouth daily.     fexofenadine 180 MG tablet  Commonly known as:  ALLEGRA  Take 1 tablet (180 mg total) by mouth daily.     Fish Oil 1000 MG Caps  Take 1 capsule by mouth daily.     GLUCOSAMINE CHONDR 1500 COMPLX PO  Take 1 capsule by mouth daily.     LORazepam 1 MG tablet  Commonly known as:  ATIVAN  TAKE 1/2 TABLET BY MOUTH EVERY DAY AND 1 EVERY NIGHT AT BEDTIME.     Melatonin 3 MG Tabs  Take 3 tablets by mouth at bedtime.     multivitamin tablet  Take 1 tablet by mouth daily.     niacin 100 MG tablet  Take 250 mg by mouth daily with breakfast.     OVER THE COUNTER MEDICATION  Place 1 drop into both eyes every morning.     simvastatin 40 MG tablet  Commonly known as:  ZOCOR  TAKE 1 TABLET BY MOUTH AT BEDTIME FOR CHOLESTEROL     SUPER B COMPLEX PO  Take 1 tablet by mouth daily.     Vitamin D 2000 UNITS Caps  Take 2,000 Units by mouth daily.     vitamin E 400 UNIT capsule  Take 400 Units by mouth daily.           Follow-up Information    Follow up with [facility], HEATH, MD In 3 months.   Specialty:  Interventional Radiology   Why:  Office will call with appointment details   Contact information:   [address] [place] [address] [phone]       Signed: Ascencion Dike PA-C 01/09/2015, 10:40 AM

## 2015-01-09 NOTE — Care Management Note (Signed)
    Page 1 of 1   01/09/2015     12:13:16 PM CARE MANAGEMENT NOTE 01/09/2015  Patient:  ATUL, DELUCIA   Account Number:  [id number]  Date Initiated:  01/09/2015  Documentation initiated by:  Tomi Bamberger  Subjective/Objective Assessment:   admit as observaiton- from home     Action/Plan:   Anticipated DC Date:  01/09/2015   Anticipated DC Plan:  [place]  CM consult      Choice offered to / List presented to:             Status of service:  Completed, signed off Medicare Important Message given?  NA - LOS <3 / Initial given by admissions (If response is "NO", the following Medicare IM given date fields will be blank) Date Medicare IM given:   Medicare IM given by:   Date Additional Medicare IM given:   Additional Medicare IM given by:    Discharge Disposition:  HOME/SELF CARE  Per UR Regulation:  Reviewed for med. necessity/level of care/duration of stay  If discussed at [place] of Stay Meetings, dates discussed:    Comments:  01/09/15 [place] [phone] patient dc today no needs.

## 2015-01-09 NOTE — Progress Notes (Signed)
Reviewed discharge instructions with pt.  Pt verbalized understanding regarding catheter site care and when to remove the dressing and when to call the MD.  Pt denied any other needs at this time.  PIV removed.  Pt taken to discharge location via wheelchair.

## 2015-01-09 NOTE — Discharge Instructions (Signed)

## 2015-01-14 ENCOUNTER — Telehealth: Payer: Self-pay | Admitting: Radiology

## 2015-01-14 NOTE — Telephone Encounter (Signed)
S/p:  6 days post Type 2 Endoleak Repair of AAA  Patient called w/ complaint of Right hip pain.  Describes pain as feeling as though he received a sharp blow to Right hip.  Denies pain or swelling of Right femoral artery access site.  Has not been taking pain meds.  Afebrile.  Patient is not aware of any bruising.   Dr Jacqulynn Cadet notified at 1:45 pm.  Patient called with instructions as recommended by Dr Laurence Ferrari (Aleve 500 mg bid).  Patient states that he prefers Tylenol and will try that first.  Instructed patient to call back as needed.  Andrew Hark Riki Rusk, RN 01/14/2015 2:55 PM

## 2015-01-21 ENCOUNTER — Telehealth: Payer: Self-pay | Admitting: Radiology

## 2015-01-21 DIAGNOSIS — B351 Tinea unguium: Secondary | ICD-10-CM | POA: Diagnosis not present

## 2015-01-21 DIAGNOSIS — M79674 Pain in right toe(s): Secondary | ICD-10-CM | POA: Diagnosis not present

## 2015-01-21 DIAGNOSIS — M79675 Pain in left toe(s): Secondary | ICD-10-CM | POA: Diagnosis not present

## 2015-01-21 NOTE — Telephone Encounter (Signed)
Patient states that he Right hip pain has resolved.  Afebrile.  States that he seems a little more fatigued than usual.  Denies shortness of breath.  Dr Laurence Ferrari aware.  Patient instructed to gradually increase activities.  Also, to call our office for any future concerns/questions.  Demonie Kassa Riki Rusk, RN 01/21/2015 3:08 PM

## 2015-02-10 ENCOUNTER — Other Ambulatory Visit: Payer: Self-pay | Admitting: Internal Medicine

## 2015-03-07 ENCOUNTER — Other Ambulatory Visit: Payer: Self-pay | Admitting: Internal Medicine

## 2015-03-08 ENCOUNTER — Ambulatory Visit: Payer: Medicare Other | Admitting: Internal Medicine

## 2015-03-08 DIAGNOSIS — Z23 Encounter for immunization: Secondary | ICD-10-CM | POA: Diagnosis not present

## 2015-03-15 ENCOUNTER — Ambulatory Visit (INDEPENDENT_AMBULATORY_CARE_PROVIDER_SITE_OTHER): Payer: Medicare Other | Admitting: Nurse Practitioner

## 2015-03-15 ENCOUNTER — Encounter: Payer: Self-pay | Admitting: Nurse Practitioner

## 2015-03-15 VITALS — BP 132/78 | HR 85 | Temp 97.4°F | Resp 18 | Ht 68.0 in | Wt 200.4 lb

## 2015-03-15 DIAGNOSIS — G4700 Insomnia, unspecified: Secondary | ICD-10-CM | POA: Diagnosis not present

## 2015-03-15 DIAGNOSIS — F329 Major depressive disorder, single episode, unspecified: Secondary | ICD-10-CM | POA: Diagnosis not present

## 2015-03-15 DIAGNOSIS — IMO0001 Reserved for inherently not codable concepts without codable children: Secondary | ICD-10-CM

## 2015-03-15 DIAGNOSIS — E785 Hyperlipidemia, unspecified: Secondary | ICD-10-CM | POA: Diagnosis not present

## 2015-03-15 DIAGNOSIS — F32A Depression, unspecified: Secondary | ICD-10-CM

## 2015-03-15 DIAGNOSIS — T82330D Leakage of aortic (bifurcation) graft (replacement), subsequent encounter: Secondary | ICD-10-CM

## 2015-03-15 NOTE — Patient Instructions (Signed)
Make a to do list!  Follow up in 3 months for Physical with Dr Mariea Clonts with fasting lab work prior to visit

## 2015-03-15 NOTE — Progress Notes (Signed)
Patient ID: Andrew Vega, male   DOB: 28-Oct-1925, 81 y.o.   MRN: [id number]    PCP: Hollace Kinnier, DO  Allergies  Allergen Reactions  . Celebrex [Celecoxib] Other (See Comments)    Raised B/P   . Codeine Other (See Comments)    Severe stomach pain     Chief Complaint  Patient presents with  . Medical Management of Chronic Issues     HPI: Patient is a 81 y.o. male seen in the office today for routine follow up. Pt of Dr Mariea Clonts with a pmh of anxiety and depression, insomnia, allergic rhinitis, artery stenosis, hyperlipidemia, chronic cough. In march underwent endovascular repair of endoleak of previous AAA stenting has follow up with them in 6 months from procedures.   Taking celexa routinely, still not sleeping well.  Nap helps with the fatigue and energy level.  Still feels lonely due to wife with advanced dementia.   No routine exercise or diet  Advanced Directive information Does patient have an advance directive?: Yes (unknown), Does patient want to make changes to advanced directive?: Yes - information given Review of Systems:  Review of Systems  Constitutional: Negative for fever and chills.  Respiratory: Positive for cough. Negative for shortness of breath.   Cardiovascular: Negative for chest pain and leg swelling.  Gastrointestinal: Negative for abdominal pain, diarrhea, constipation and blood in stool.  Genitourinary: Negative for dysuria.  Skin: Negative for rash.  Neurological: Negative for dizziness.  Hematological: Does not bruise/bleed easily.  Psychiatric/Behavioral: The patient is nervous/anxious.     Past Medical History  Diagnosis Date  . Anxiety   . Hyperlipidemia   . Abdominal aneurysm without mention of rupture   . Insomnia, unspecified   . Colon polyps   . Arthritis   . Diverticulosis of colon (without mention of hemorrhage)   . COPD (chronic obstructive pulmonary disease)   . Inguinal hernia   . Cancer     skin cancer removal  . Anginal  pain     recent hospitalization  . H/O hiatal hernia    Past Surgical History  Procedure Laterality Date  . Vasectomy  1962  . Cholecystectomy  1962  . Colon surgery  2007  . Bilateral shoulder repair    . Nasal septum surgery  2011  . Hernia repair    . Abdominal aortic endovascular stent graft N/A 10/29/2013    Procedure: ABDOMINAL AORTIC ENDOVASCULAR STENT GRAFT;  Surgeon: [name], MD;  Location: [facility] OR;  Service: Vascular;  Laterality: N/A;   Social History:   reports that he quit smoking about 30 years ago. His smoking use included Cigarettes. He has a 18.75 pack-year smoking history. He has never used smokeless tobacco. He reports that he drinks about 0.6 oz of alcohol per week. He reports that he does not use illicit drugs.  Family History  Problem Relation Age of Onset  . Colon cancer Neg Hx   . Heart attack Father 10    Medications: Patient's Medications  New Prescriptions   No medications on file  Previous Medications   ASPIRIN 81 MG TABLET    Take 81 mg by mouth daily.   B COMPLEX-C (SUPER B COMPLEX PO)    Take 1 tablet by mouth daily.    CHLORPHENIRAMINE (CHLOR-TRIMETON) 4 MG TABLET    Take 4-8 mg by mouth every 6 (six) hours as needed for allergies.    CHOLECALCIFEROL (VITAMIN D) 2000 UNITS CAPS    Take 2,000 Units by mouth daily.  CITALOPRAM (CELEXA) 40 MG TABLET    Take 1 tablet (40 mg total) by mouth daily.   FEXOFENADINE (ALLEGRA) 180 MG TABLET    Take 1 tablet (180 mg total) by mouth daily.   GLUCOSAMINE-CHONDROIT-VIT C-MN (GLUCOSAMINE CHONDR 1500 COMPLX PO)    Take 1 capsule by mouth daily.   LORAZEPAM (ATIVAN) 1 MG TABLET    TAKE 1/2 TABLET BY MOUTH EVERY MORNING AND 1 TABLET BY MOUTH EVERY NIGHT AT BEDTIME   MELATONIN 3 MG TABS    Take 3 tablets by mouth at bedtime.    MULTIPLE VITAMIN (MULTIVITAMIN) TABLET    Take 1 tablet by mouth daily.   NIACIN 100 MG TABLET    Take 250 mg by mouth daily with breakfast.    OMEGA-3 FATTY ACIDS (FISH OIL) 1000 MG  CAPS    Take 1 capsule by mouth daily.   OVER THE COUNTER MEDICATION    Place 1 drop into both eyes every morning.   SIMVASTATIN (ZOCOR) 40 MG TABLET    TAKE 1 TABLET BY MOUTH AT BEDTIME FOR CHOLESTEROL   VITAMIN E 400 UNIT CAPSULE    Take 400 Units by mouth daily.  Modified Medications   No medications on file  Discontinued Medications   No medications on file     Physical Exam:  Filed Vitals:   03/15/15 1405  BP: 132/78  Pulse: 85  Temp: 97.4 F (36.3 C)  TempSrc: Oral  Resp: 18  Height: 5\' 8"  (1.727 m)  Weight: 200 lb 6.4 oz (90.901 kg)  SpO2: 96%    Physical Exam  Constitutional: He is oriented to person, place, and time. He appears well-developed and well-nourished. No distress.  Cardiovascular: Normal rate, regular rhythm and normal heart sounds.   Pulmonary/Chest: Effort normal and breath sounds normal.  Abdominal: Soft. Bowel sounds are normal. He exhibits no distension.  Musculoskeletal: Normal range of motion.  Neurological: He is alert and oriented to person, place, and time.  Skin: Skin is warm and dry.  Psychiatric: He has a normal mood and affect.    Labs reviewed: Basic Metabolic Panel:  Recent Labs  03/30/14 0920 05/19/14 0927 12/01/14 1017 01/08/15 1015  NA 142 140  --  139  K 4.1 4.4  --  4.7  CL 105 104  --  108  CO2 25 21  --  25  GLUCOSE 98 96  --  100*  BUN 20 19 23 15   CREATININE 1.46* 1.41* 1.50* 1.50*  CALCIUM 9.2 9.3  --  9.3   Liver Function Tests:  Recent Labs  03/30/14 0920 05/19/14 0927  AST 25 21  ALT 19 18  ALKPHOS 102 75  BILITOT 0.4 0.4  PROT 6.9 6.8   No results for input(s): LIPASE, AMYLASE in the last 8760 hours. No results for input(s): AMMONIA in the last 8760 hours. CBC:  Recent Labs  03/30/14 0920 05/19/14 0927 01/08/15 1015  WBC 7.3 8.2 6.3  NEUTROABS 4.3 5.0 3.5  HGB 13.4 13.5 13.7  HCT 39.4 38.6 39.1  MCV 92 91 89.3  PLT 203 193 164   Lipid Panel:  Recent Labs  03/30/14 0920  05/19/14 0927  CHOL 170 161  HDL 47 47  LDLCALC 94 87  TRIG 143 135  CHOLHDL 3.6 3.4   TSH: No results for input(s): TSH in the last 8760 hours. A1C: No results found for: HGBA1C   Assessment/Plan 1. Endoleak post (EVAR) endovascular aneurysm repair, subsequent encounter -after AAA repair, following with  vascular routinely, conts on ASA daily - CBC with Differential; Future  2. Hyperlipidemia -conts on zocor, does not follow diet or exercise regimen -realizes he should, lifestyle modifications encouraged - Lipid panel; Future - Comprehensive metabolic panel; Future  3. Insomnia -ongoing issue due to anxiety and depression, feels like afternoon nap helps fatigue -conts on melatonin, ativan, celexa  4. Depression - has not followed up with therapist like discussed at last visit, no motivation, does not feel like celexa is really helping because his situation is not something that can not be fix with a medication. No HI or SI. -again discussed use of medication as well as therapy to get most benefit for treatment.   To follow up in 3 months with Dr Mariea Clonts for EV with blood work prior to visit.

## 2015-03-29 DIAGNOSIS — F330 Major depressive disorder, recurrent, mild: Secondary | ICD-10-CM | POA: Diagnosis not present

## 2015-03-30 ENCOUNTER — Encounter: Payer: Self-pay | Admitting: Internal Medicine

## 2015-03-31 ENCOUNTER — Other Ambulatory Visit: Payer: Self-pay | Admitting: Internal Medicine

## 2015-04-08 DIAGNOSIS — F330 Major depressive disorder, recurrent, mild: Secondary | ICD-10-CM | POA: Diagnosis not present

## 2015-04-14 ENCOUNTER — Ambulatory Visit (INDEPENDENT_AMBULATORY_CARE_PROVIDER_SITE_OTHER): Payer: Medicare Other | Admitting: Podiatry

## 2015-04-14 DIAGNOSIS — B351 Tinea unguium: Secondary | ICD-10-CM | POA: Diagnosis not present

## 2015-04-14 DIAGNOSIS — M79676 Pain in unspecified toe(s): Secondary | ICD-10-CM

## 2015-04-14 DIAGNOSIS — M79674 Pain in right toe(s): Secondary | ICD-10-CM

## 2015-04-14 DIAGNOSIS — M79675 Pain in left toe(s): Secondary | ICD-10-CM

## 2015-04-14 NOTE — Progress Notes (Signed)
Patient ID: Andrew Vega, male   DOB: April 22, 1926, 81 y.o.   MRN: [id number] Complaint:  Visit Type: Patient returns to my office for continued preventative foot care services. Complaint: Patient states" my nails have grown long and thick and become painful to walk and wear shoes" Patient has been diagnosed with DM with no complications. He presents for preventative foot care services. No changes to ROS  Podiatric Exam: Vascular: dorsalis pedis and posterior tibial pulses are palpable bilateral. Capillary return is immediate. Temperature gradient is WNL. Skin turgor WNL  Sensorium: Normal Semmes Weinstein monofilament test. Normal tactile sensation bilaterally. Nail Exam: Pt has thick disfigured discolored nails with subungual debris noted bilateral entire nail hallux  Ulcer Exam: There is no evidence of ulcer or pre-ulcerative changes or infection. Orthopedic Exam: Muscle tone and strength are WNL. No limitations in general ROM. No crepitus or effusions noted. Foot type and digits show no abnormalities. Bony prominences are unremarkable. Skin: No Porokeratosis. No infection or ulcers.  Heloma durum fifth toe right foot  Diagnosis:  Tinea unguium, Pain in right toe, pain in left toes  Treatment & Plan Procedures and Treatment: Consent by patient was obtained for treatment procedures. The patient understood the discussion of treatment and procedures well. All questions were answered thoroughly reviewed. Debridement of mycotic and hypertrophic toenails, 1 through 5 bilateral and clearing of subungual debris. No ulceration, no infection noted.  Return Visit-Office Procedure: Patient instructed to return to the office for a follow up visit 3 months for continued evaluation and treatment.

## 2015-04-20 DIAGNOSIS — F330 Major depressive disorder, recurrent, mild: Secondary | ICD-10-CM | POA: Diagnosis not present

## 2015-05-04 ENCOUNTER — Ambulatory Visit: Payer: Self-pay | Admitting: Nurse Practitioner

## 2015-05-04 DIAGNOSIS — F330 Major depressive disorder, recurrent, mild: Secondary | ICD-10-CM | POA: Diagnosis not present

## 2015-05-06 ENCOUNTER — Ambulatory Visit (INDEPENDENT_AMBULATORY_CARE_PROVIDER_SITE_OTHER): Payer: Medicare Other | Admitting: Nurse Practitioner

## 2015-05-06 ENCOUNTER — Encounter: Payer: Self-pay | Admitting: Nurse Practitioner

## 2015-05-06 VITALS — BP 120/78 | HR 64 | Temp 98.7°F | Resp 16 | Ht 68.0 in | Wt 200.0 lb

## 2015-05-06 DIAGNOSIS — R296 Repeated falls: Secondary | ICD-10-CM | POA: Diagnosis not present

## 2015-05-06 DIAGNOSIS — M1990 Unspecified osteoarthritis, unspecified site: Secondary | ICD-10-CM

## 2015-05-06 DIAGNOSIS — J309 Allergic rhinitis, unspecified: Secondary | ICD-10-CM | POA: Diagnosis not present

## 2015-05-06 DIAGNOSIS — R2689 Other abnormalities of gait and mobility: Secondary | ICD-10-CM | POA: Diagnosis not present

## 2015-05-06 NOTE — Patient Instructions (Signed)
Stop Allegra and start Claritin 10 mg daily for allergies  Will make referral for Physical Therapy for balance Will get CT of the head as well for evaluation

## 2015-05-06 NOTE — Progress Notes (Signed)
Patient ID: Andrew Vega, male   DOB: July 31, 1926, 82 y.o.   MRN: [id number]    PCP: Hollace Kinnier, DO  Allergies  Allergen Reactions  . Celebrex [Celecoxib] Other (See Comments)    Raised B/P   . Codeine Other (See Comments)    Severe stomach pain     Chief Complaint  Patient presents with  . Acute Visit    Patient c/o balance issues and bones making a crackling sound in neck x several weeks. No recent injury to neck      HPI: Patient is a 80 y.o. male seen in the office today for a few questions and concerns  Left ear feels odd, relates it to the falls he has had recently. Has allergies since he has been in [place]. Ear does not feel full, no pressure, no hearing loss. No dizziness, no ringing in the ear.  Feels like air goes through the ear. Off from the right ear  Feels like he is off balance and getting ready to trip frequently. Does not do any exercise. Feels like he is going one and then over compensates and goes the other way sedentary.  Has had 3 falls in the last year and a lot of almost falls.  No dizziness. No blackouts, just lost balance  Reports he feels his neck making a crackling sound when he leans his head back to shave and turns his head to the side. Does not cause pain just is annoying. Has had this issue in the past and has done neck exercises.   Review of Systems:  Review of Systems  Constitutional: Negative for activity change, appetite change, fatigue and unexpected weight change.  HENT: Negative for congestion and hearing loss.   Eyes: Negative.   Respiratory: Negative for cough and shortness of breath.   Cardiovascular: Negative for chest pain, palpitations and leg swelling.  Gastrointestinal: Negative for diarrhea and constipation.  Genitourinary: Negative for dysuria and difficulty urinating.  Musculoskeletal: Positive for gait problem (imbalance, freq falls). Negative for myalgias and arthralgias.  Neurological: Negative for dizziness, syncope, facial  asymmetry, speech difficulty, weakness, light-headedness, numbness and headaches.    Past Medical History  Diagnosis Date  . Anxiety   . Hyperlipidemia   . Abdominal aneurysm without mention of rupture   . Insomnia, unspecified   . Colon polyps   . Arthritis   . Diverticulosis of colon (without mention of hemorrhage)   . COPD (chronic obstructive pulmonary disease)   . Inguinal hernia   . Cancer     skin cancer removal  . Anginal pain     recent hospitalization  . H/O hiatal hernia    Past Surgical History  Procedure Laterality Date  . Vasectomy  1962  . Cholecystectomy  1962  . Colon surgery  2007  . Bilateral shoulder repair    . Nasal septum surgery  2011  . Hernia repair    . Abdominal aortic endovascular stent graft N/A 10/29/2013    Procedure: ABDOMINAL AORTIC ENDOVASCULAR STENT GRAFT;  Surgeon: [name], MD;  Location: [facility] OR;  Service: Vascular;  Laterality: N/A;   Social History:   reports that he quit smoking about 30 years ago. His smoking use included Cigarettes. He has a 18.75 pack-year smoking history. He has never used smokeless tobacco. He reports that he drinks about 0.6 oz of alcohol per week. He reports that he does not use illicit drugs.  Family History  Problem Relation Age of Onset  . Colon cancer  Neg Hx   . Heart attack Father 4    Medications: Patient's Medications  New Prescriptions   No medications on file  Previous Medications   ASPIRIN 81 MG TABLET    Take 81 mg by mouth daily.   B COMPLEX-C (SUPER B COMPLEX PO)    Take 1 tablet by mouth daily.    CHLORPHENIRAMINE (CHLOR-TRIMETON) 4 MG TABLET    Take 4-8 mg by mouth every 6 (six) hours as needed for allergies.    CHOLECALCIFEROL (VITAMIN D) 2000 UNITS CAPS    Take 2,000 Units by mouth daily.    CITALOPRAM (CELEXA) 40 MG TABLET    Take 1 tablet (40 mg total) by mouth daily.   FEXOFENADINE (ALLEGRA) 180 MG TABLET    Take 1 tablet (180 mg total) by mouth daily.    GLUCOSAMINE-CHONDROIT-VIT C-MN (GLUCOSAMINE CHONDR 1500 COMPLX PO)    Take 1 capsule by mouth daily.   LORAZEPAM (ATIVAN) 1 MG TABLET    TAKE 1/2 TABLET BY MOUTH EVERY MORNING AND 1 TABLET EVERY NIGHT AT BEDTIME   MELATONIN 3 MG TABS    Take 2 tablets by mouth at bedtime.    MULTIPLE VITAMIN (MULTIVITAMIN) TABLET    Take 1 tablet by mouth daily.   NIACIN 100 MG TABLET    Take 250 mg by mouth daily with breakfast.    OMEGA-3 FATTY ACIDS (FISH OIL) 1000 MG CAPS    Take 1 capsule by mouth daily.   OVER THE COUNTER MEDICATION    Place 1 drop into both eyes every morning.   SIMVASTATIN (ZOCOR) 40 MG TABLET    TAKE 1 TABLET BY MOUTH AT BEDTIME FOR CHOLESTEROL   VITAMIN E 400 UNIT CAPSULE    Take 400 Units by mouth daily.  Modified Medications   No medications on file  Discontinued Medications   No medications on file     Physical Exam:  Filed Vitals:   05/06/15 1441  BP: 120/78  Pulse: 64  Temp: 98.7 F (37.1 C)  TempSrc: Oral  Resp: 16  Weight: 200 lb (90.719 kg)  SpO2: 94%    Physical Exam  Constitutional: He is oriented to person, place, and time. He appears well-developed and well-nourished. No distress.  HENT:  Head: Normocephalic and atraumatic.  Right Ear: External ear normal.  Left Ear: External ear normal.  Nose: Nose normal.  Mouth/Throat: Oropharynx is clear and moist. No oropharyngeal exudate.  Eyes: Conjunctivae and EOM are normal. Pupils are equal, round, and reactive to light.  Neck: Normal range of motion. Neck supple.  Cardiovascular: Normal rate, regular rhythm and normal heart sounds.   Pulmonary/Chest: Effort normal and breath sounds normal.  Abdominal: Soft. Bowel sounds are normal.  Musculoskeletal: He exhibits no edema or tenderness.  Able to get up from chair without use of hands, gait is slow but stable  Neurological: He is alert and oriented to person, place, and time.  Skin: Skin is warm and dry. He is not diaphoretic.  Psychiatric: He has a normal  mood and affect.    Labs reviewed: Basic Metabolic Panel:  Recent Labs  05/19/14 0927 12/01/14 1017 01/08/15 1015  NA 140  --  139  K 4.4  --  4.7  CL 104  --  108  CO2 21  --  25  GLUCOSE 96  --  100*  BUN 19 23 15   CREATININE 1.41* 1.50* 1.50*  CALCIUM 9.3  --  9.3   Liver Function Tests:  Recent  Labs  05/19/14 0927  AST 21  ALT 18  ALKPHOS 75  BILITOT 0.4  PROT 6.8   No results for input(s): LIPASE, AMYLASE in the last 8760 hours. No results for input(s): AMMONIA in the last 8760 hours. CBC:  Recent Labs  05/19/14 0927 01/08/15 1015  WBC 8.2 6.3  NEUTROABS 5.0 3.5  HGB 13.5 13.7  HCT 38.6 39.1  MCV 91 89.3  PLT 193 164   Lipid Panel:  Recent Labs  05/19/14 0927  CHOL 161  HDL 47  LDLCALC 87  TRIG 135  CHOLHDL 3.4   TSH: No results for input(s): TSH in the last 8760 hours. A1C: No results found for: HGBA1C   Assessment/Plan 1. Imbalance -pt with 3 falls in the last year and multiple episodes of imbalance and almost falling.  - CT Head Wo Contrast; to evaluate possible stroke vs mass to cause imbalance - Ambulatory referral to Physical Therapy for gait and strength training to help with balance  2. Falls frequently -discussed fall prevention, getting up and walking slowly. Will consult PT at this time.   3. Allergic rhinitis, unspecified allergic rhinitis type Stop Allegra and start Claritin 10 mg daily for allergies  4. Osteoarthritis, unspecified osteoarthritis type, unspecified site No pain in neck but cracking sounds, good ROM without point tenderness, encouraged exercise. May use heat if needed  Keep follow up with Dr Mariea Clonts for physical with blood work prior to visit   Kenly Xiao K. Harle Battiest  [facility] [phone] 8 am - 5 pm) [phone] (after hours)

## 2015-05-17 ENCOUNTER — Other Ambulatory Visit: Payer: Self-pay

## 2015-05-17 ENCOUNTER — Other Ambulatory Visit: Payer: Self-pay | Admitting: Internal Medicine

## 2015-05-17 ENCOUNTER — Ambulatory Visit
Admission: RE | Admit: 2015-05-17 | Discharge: 2015-05-17 | Disposition: A | Discharge status: Home / Self Care | Payer: Medicare Other | Source: Ambulatory Visit | Attending: Nurse Practitioner | Admitting: Nurse Practitioner

## 2015-05-17 DIAGNOSIS — R2689 Other abnormalities of gait and mobility: Secondary | ICD-10-CM

## 2015-05-17 MED ORDER — LORAZEPAM 1 MG PO TABS: ORAL_TABLET | ORAL | Status: DC

## 2015-05-18 DIAGNOSIS — F330 Major depressive disorder, recurrent, mild: Secondary | ICD-10-CM | POA: Diagnosis not present

## 2015-05-27 DIAGNOSIS — I714 Abdominal aortic aneurysm, without rupture: Secondary | ICD-10-CM | POA: Diagnosis not present

## 2015-06-03 DIAGNOSIS — F330 Major depressive disorder, recurrent, mild: Secondary | ICD-10-CM | POA: Diagnosis not present

## 2015-06-04 ENCOUNTER — Encounter: Payer: Self-pay | Admitting: Surgery

## 2015-06-04 ENCOUNTER — Other Ambulatory Visit: Payer: Self-pay | Admitting: *Deleted

## 2015-06-04 DIAGNOSIS — Z01812 Encounter for preprocedural laboratory examination: Secondary | ICD-10-CM

## 2015-06-07 ENCOUNTER — Encounter: Payer: Self-pay | Admitting: Surgery

## 2015-06-07 ENCOUNTER — Ambulatory Visit (HOSPITAL_COMMUNITY)
Admission: RE | Admit: 2015-06-07 | Discharge: 2015-06-07 | Disposition: A | Discharge status: Home / Self Care | Payer: Medicare Other | Source: Ambulatory Visit | Attending: Surgery | Admitting: Surgery

## 2015-06-07 ENCOUNTER — Ambulatory Visit (INDEPENDENT_AMBULATORY_CARE_PROVIDER_SITE_OTHER): Payer: Medicare Other | Admitting: Surgery

## 2015-06-07 ENCOUNTER — Ambulatory Visit
Admission: RE | Admit: 2015-06-07 | Discharge: 2015-06-07 | Disposition: A | Discharge status: Home / Self Care | Payer: Medicare Other | Source: Ambulatory Visit | Attending: Surgery | Admitting: Surgery

## 2015-06-07 ENCOUNTER — Other Ambulatory Visit: Payer: Self-pay | Admitting: Surgery

## 2015-06-07 VITALS — BP 139/81 | HR 86 | Ht 68.0 in | Wt 200.2 lb

## 2015-06-07 DIAGNOSIS — I6523 Occlusion and stenosis of bilateral carotid arteries: Secondary | ICD-10-CM

## 2015-06-07 DIAGNOSIS — I714 Abdominal aortic aneurysm, without rupture, unspecified: Secondary | ICD-10-CM

## 2015-06-07 DIAGNOSIS — Z48812 Encounter for surgical aftercare following surgery on the circulatory system: Secondary | ICD-10-CM

## 2015-06-07 NOTE — Progress Notes (Signed)
Patient name: Andrew Vega MRN: [id number] DOB: 1926/07/26 Sex: male     Chief Complaint  Patient presents with  . Re-evaluation    6 month f/u - CTA not done    HISTORY OF PRESENT ILLNESS: The patient is back today for his first postoperative visit. 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 Maximum aortic size was 5.3 cm. I placed a proximal extension for a type I endoleak. This was originally scheduled to be performed by Dr. Kellie Simmering, however he had a family emergency and therefore I did the procedure. The patient's postoperative course was uncomplicated.   He had a type II endoleak which was associated with aneurysm sac expansion.  Therefore he underwent embolization in March 2016.  He is here for follow-up of his abdominal aneurysm as well as his carotid occlusive disease which is asymptomatic.  Past Medical History  Diagnosis Date  . Anxiety   . Hyperlipidemia   . Abdominal aneurysm without mention of rupture   . Insomnia, unspecified   . Colon polyps   . Arthritis   . Diverticulosis of colon (without mention of hemorrhage)   . COPD (chronic obstructive pulmonary disease)   . Inguinal hernia   . Cancer     skin cancer removal  . Anginal pain     recent hospitalization  . H/O hiatal hernia     Past Surgical History  Procedure Laterality Date  . Vasectomy  1962  . Cholecystectomy  1962  . Colon surgery  2007  . Bilateral shoulder repair    . Nasal septum surgery  2011  . Hernia repair    . Abdominal aortic endovascular stent graft N/A 10/29/2013    Procedure: ABDOMINAL AORTIC ENDOVASCULAR STENT GRAFT;  Surgeon: [name], MD;  Location: [facility] OR;  Service: Vascular;  Laterality: N/A;    Social History   Social History  . Marital Status: Married    Spouse Name: N/A  . Number of Children: N/A  . Years of Education: N/A   Occupational History  . retired    Social History Main Topics  . Smoking status: Former Smoker  -- 0.75 packs/day for 25 years    Types: Cigarettes    Quit date: 10/23/1984  . Smokeless tobacco: Never Used  . Alcohol Use: 0.6 oz/week    1 Glasses of wine per week     Comment: social  . Drug Use: No  . Sexual Activity: No   Other Topics Concern  . Not on file   Social History Narrative    Family History  Problem Relation Age of Onset  . Colon cancer Neg Hx   . Heart attack Father 62    Allergies as of 06/07/2015 - Review Complete 06/07/2015  Allergen Reaction Noted  . Celebrex [celecoxib] Other (See Comments) 07/10/2013  . Codeine Other (See Comments) 04/14/2013    Current Outpatient Prescriptions on File Prior to Visit  Medication Sig Dispense Refill  . aspirin 81 MG tablet Take 81 mg by mouth daily.    . B Complex-C (SUPER B COMPLEX PO) Take 1 tablet by mouth daily.     . chlorpheniramine (CHLOR-TRIMETON) 4 MG tablet Take 4-8 mg by mouth every 6 (six) hours as needed for allergies.     . Cholecalciferol (VITAMIN D) 2000 UNITS CAPS Take 2,000 Units by mouth daily.     . citalopram (CELEXA) 40 MG tablet Take 1 tablet (40 mg total) by mouth daily. 90 tablet 1  .  fexofenadine (ALLEGRA) 180 MG tablet Take 1 tablet (180 mg total) by mouth daily. 30 tablet 0  . Glucosamine-Chondroit-Vit C-Mn (GLUCOSAMINE CHONDR 1500 COMPLX PO) Take 1 capsule by mouth daily.    Marland Kitchen LORazepam (ATIVAN) 1 MG tablet TAKE 1/2 TABLET BY MOUTH EVERY MORNING AND 1 EVERY NIGHT AT BEDTIME. 45 tablet 0  . Melatonin 3 MG TABS Take 2 tablets by mouth at bedtime.     . Multiple Vitamin (MULTIVITAMIN) tablet Take 1 tablet by mouth daily.    . niacin 100 MG tablet Take 250 mg by mouth daily with breakfast.     . Omega-3 Fatty Acids (FISH OIL) 1000 MG CAPS Take 1 capsule by mouth daily.    Marland Kitchen OVER THE COUNTER MEDICATION Place 1 drop into both eyes every morning.    . simvastatin (ZOCOR) 40 MG tablet TAKE 1 TABLET BY MOUTH AT BEDTIME FOR CHOLESTEROL 90 tablet 1  . vitamin E 400 UNIT capsule Take 400 Units by  mouth daily.     No current facility-administered medications on file prior to visit.     REVIEW OF SYSTEMS: Cardiovascular: Positive shortness of breath.   Pulmonary positive for productive cough,  no asthma or wheezing. Neurologic: No weakness, paresthesias, aphasia, or amaurosis. No dizziness. Hematologic: No bleeding problems or clotting disorders. Musculoskeletal: No joint pain or joint swelling. Gastrointestinal: No blood in stool or hematemesis Genitourinary: No dysuria or hematuria. Psychiatric:: No history of major depression. Integumentary: No rashes or ulcers. Constitutional: No fever or chills.  PHYSICAL EXAMINATION:   Vital signs are  Filed Vitals:   06/07/15 1610 06/07/15 1613  BP: 133/83 139/81  Pulse: 86   Height: 5\' 8"  (1.727 m)   Weight: 200 lb 3.2 oz (90.81 kg)   SpO2: 98%    Body mass index is 30.45 kg/(m^2). General: The patient appears their stated age. HEENT:  No gross abnormalities Pulmonary:  Non labored breathing Abdomen: Soft and non-tender Musculoskeletal: There are no major deformities. Neurologic: No focal weakness or paresthesias are detected, Skin: There are no ulcer or rashes noted. Psychiatric: The patient has normal affect. Cardiovascular: There is a regular rate and rhythm without significant murmur appreciated.   Diagnostic Studies  I have reviewed his carotid Doppler study which showed less than 40% right carotid stenosis and 40-59 percent left carotid stenosis  Assessment: #1: Carotid stenosis #2 abdominal aortic aneurysm, status post repair   Plan: #1: The patient's carotid disease remains stable.  He will follow-up with a carotid duplex in one year. #2: The patient did not get his CT scan today because they could not locate his lab work.  He is scheduled to see interventional radiology tomorrow.  I am trying to facilitate getting his CT scan done prior to that visit.  If not that visit will need to be changed as well.  The  office and the patient will try to help correlate this tomorrow so that he can get a CT scan to evaluate the size of the aneurysm as well as the repair of the type II endoleak.  I have the patient scheduled to follow-up with me in 6 months with an abdominal ultrasound.    Eldridge Abrahams, M.D. [facility] Office: [phone] Pager:  [phone]

## 2015-06-08 ENCOUNTER — Other Ambulatory Visit (HOSPITAL_COMMUNITY): Payer: Self-pay | Admitting: Interventional Radiology

## 2015-06-08 ENCOUNTER — Other Ambulatory Visit (HOSPITAL_COMMUNITY): Admission: RE | Admit: 2015-06-08 | Payer: Medicare Other | Admitting: Surgery

## 2015-06-08 ENCOUNTER — Other Ambulatory Visit: Payer: Medicare Other

## 2015-06-08 DIAGNOSIS — IMO0001 Reserved for inherently not codable concepts without codable children: Secondary | ICD-10-CM

## 2015-06-08 DIAGNOSIS — T82330D Leakage of aortic (bifurcation) graft (replacement), subsequent encounter: Secondary | ICD-10-CM

## 2015-06-08 LAB — CREATININE, SERUM
CREATININE: 1.51 mg/dL — AB (ref 0.61–1.24)
GFR calc Af Amer: 46 mL/min — ABNORMAL LOW (ref >60)
GFR calc non Af Amer: 40 mL/min — ABNORMAL LOW (ref >60)

## 2015-06-08 LAB — BUN: BUN: 24 mg/dL — AB (ref 6–20)

## 2015-06-08 NOTE — Addendum Note (Signed)
Addended by: Dorthula Rue L on: 06/08/2015 05:04 PM   Modules accepted: Orders

## 2015-06-09 ENCOUNTER — Ambulatory Visit
Admission: RE | Admit: 2015-06-09 | Discharge: 2015-06-09 | Disposition: A | Discharge status: Home / Self Care | Payer: Medicare Other | Source: Ambulatory Visit | Attending: Surgery | Admitting: Surgery

## 2015-06-09 ENCOUNTER — Ambulatory Visit (HOSPITAL_COMMUNITY): Payer: Medicare Other

## 2015-06-09 DIAGNOSIS — I714 Abdominal aortic aneurysm, without rupture, unspecified: Secondary | ICD-10-CM

## 2015-06-09 DIAGNOSIS — Z969 Presence of functional implant, unspecified: Secondary | ICD-10-CM | POA: Diagnosis not present

## 2015-06-09 DIAGNOSIS — T82898D Other specified complication of vascular prosthetic devices, implants and grafts, subsequent encounter: Secondary | ICD-10-CM | POA: Diagnosis not present

## 2015-06-09 DIAGNOSIS — Z48812 Encounter for surgical aftercare following surgery on the circulatory system: Secondary | ICD-10-CM

## 2015-06-09 MED ORDER — IOPAMIDOL (ISOVUE-370) INJECTION 76%
60.0000 mL | Freq: Once | INTRAVENOUS | Status: AC | PRN
  Administered 2015-06-09: 60 mL via INTRAVENOUS

## 2015-06-10 ENCOUNTER — Inpatient Hospital Stay: Admission: RE | Admit: 2015-06-10 | Payer: Medicare Other | Source: Ambulatory Visit

## 2015-06-10 ENCOUNTER — Other Ambulatory Visit: Payer: Medicare Other

## 2015-06-17 DIAGNOSIS — F330 Major depressive disorder, recurrent, mild: Secondary | ICD-10-CM | POA: Diagnosis not present

## 2015-06-21 ENCOUNTER — Other Ambulatory Visit: Payer: Medicare Other

## 2015-06-21 DIAGNOSIS — T82330D Leakage of aortic (bifurcation) graft (replacement), subsequent encounter: Secondary | ICD-10-CM | POA: Diagnosis not present

## 2015-06-21 DIAGNOSIS — E785 Hyperlipidemia, unspecified: Secondary | ICD-10-CM | POA: Diagnosis not present

## 2015-06-21 DIAGNOSIS — IMO0001 Reserved for inherently not codable concepts without codable children: Secondary | ICD-10-CM

## 2015-06-22 ENCOUNTER — Other Ambulatory Visit: Payer: Medicare Other

## 2015-06-22 LAB — LIPID PANEL
Chol/HDL Ratio: 3.3 ratio units (ref 0.0–5.0)
Cholesterol, Total: 156 mg/dL (ref 100–199)
HDL: 47 mg/dL (ref >39)
LDL Calculated: 87 mg/dL (ref 0–99)
Triglycerides: 112 mg/dL (ref 0–149)
VLDL CHOLESTEROL CAL: 22 mg/dL (ref 5–40)

## 2015-06-22 LAB — COMPREHENSIVE METABOLIC PANEL
A/G RATIO: 1.5 (ref 1.1–2.5)
ALT: 21 IU/L (ref 0–44)
AST: 24 IU/L (ref 0–40)
Albumin: 4 g/dL (ref 3.5–4.7)
Alkaline Phosphatase: 60 IU/L (ref 39–117)
BILIRUBIN TOTAL: 0.2 mg/dL (ref 0.0–1.2)
BUN/Creatinine Ratio: 16 (ref 10–22)
BUN: 25 mg/dL (ref 8–27)
CALCIUM: 9.2 mg/dL (ref 8.6–10.2)
CHLORIDE: 103 mmol/L (ref 97–108)
CO2: 22 mmol/L (ref 18–29)
Creatinine, Ser: 1.56 mg/dL — ABNORMAL HIGH (ref 0.76–1.27)
GFR calc Af Amer: 46 mL/min/{1.73_m2} — ABNORMAL LOW (ref >59)
GFR, EST NON AFRICAN AMERICAN: 40 mL/min/{1.73_m2} — AB (ref >59)
GLOBULIN, TOTAL: 2.7 g/dL (ref 1.5–4.5)
GLUCOSE: 101 mg/dL — AB (ref 65–99)
POTASSIUM: 4.4 mmol/L (ref 3.5–5.2)
SODIUM: 142 mmol/L (ref 134–144)
Total Protein: 6.7 g/dL (ref 6.0–8.5)

## 2015-06-22 LAB — CBC WITH DIFFERENTIAL/PLATELET
BASOS: 2 %
Basophils Absolute: 0.1 10*3/uL (ref 0.0–0.2)
EOS (ABSOLUTE): 0.4 10*3/uL (ref 0.0–0.4)
EOS: 7 %
HEMATOCRIT: 38.3 % (ref 37.5–51.0)
HEMOGLOBIN: 13.1 g/dL (ref 12.6–17.7)
IMMATURE GRANS (ABS): 0 10*3/uL (ref 0.0–0.1)
IMMATURE GRANULOCYTES: 0 %
LYMPHS: 23 %
Lymphocytes Absolute: 1.4 10*3/uL (ref 0.7–3.1)
MCH: 31.6 pg (ref 26.6–33.0)
MCHC: 34.2 g/dL (ref 31.5–35.7)
MCV: 93 fL (ref 79–97)
MONOCYTES: 10 %
Monocytes Absolute: 0.6 10*3/uL (ref 0.1–0.9)
NEUTROS ABS: 3.6 10*3/uL (ref 1.4–7.0)
NEUTROS PCT: 58 %
Platelets: 162 10*3/uL (ref 150–379)
RBC: 4.14 x10E6/uL (ref 4.14–5.80)
RDW: 14.6 % (ref 12.3–15.4)
WBC: 6.3 10*3/uL (ref 3.4–10.8)

## 2015-06-24 ENCOUNTER — Ambulatory Visit
Admission: RE | Admit: 2015-06-24 | Discharge: 2015-06-24 | Disposition: A | Discharge status: Home / Self Care | Payer: Medicare Other | Source: Ambulatory Visit | Attending: Interventional Radiology | Admitting: Interventional Radiology

## 2015-06-24 ENCOUNTER — Ambulatory Visit (INDEPENDENT_AMBULATORY_CARE_PROVIDER_SITE_OTHER): Payer: Medicare Other | Admitting: Internal Medicine

## 2015-06-24 ENCOUNTER — Encounter: Payer: Self-pay | Admitting: Internal Medicine

## 2015-06-24 VITALS — BP 110/62 | HR 95 | Temp 97.9°F | Resp 20 | Ht 68.0 in | Wt 204.0 lb

## 2015-06-24 DIAGNOSIS — Z23 Encounter for immunization: Secondary | ICD-10-CM

## 2015-06-24 DIAGNOSIS — F32A Depression, unspecified: Secondary | ICD-10-CM

## 2015-06-24 DIAGNOSIS — I6523 Occlusion and stenosis of bilateral carotid arteries: Secondary | ICD-10-CM

## 2015-06-24 DIAGNOSIS — Z0000 Encounter for general adult medical examination without abnormal findings: Secondary | ICD-10-CM | POA: Diagnosis not present

## 2015-06-24 DIAGNOSIS — F329 Major depressive disorder, single episode, unspecified: Secondary | ICD-10-CM | POA: Diagnosis not present

## 2015-06-24 DIAGNOSIS — T82330D Leakage of aortic (bifurcation) graft (replacement), subsequent encounter: Secondary | ICD-10-CM

## 2015-06-24 DIAGNOSIS — M159 Polyosteoarthritis, unspecified: Secondary | ICD-10-CM | POA: Diagnosis not present

## 2015-06-24 DIAGNOSIS — G4700 Insomnia, unspecified: Secondary | ICD-10-CM

## 2015-06-24 DIAGNOSIS — E785 Hyperlipidemia, unspecified: Secondary | ICD-10-CM | POA: Diagnosis not present

## 2015-06-24 DIAGNOSIS — I671 Cerebral aneurysm, nonruptured: Secondary | ICD-10-CM | POA: Diagnosis not present

## 2015-06-24 DIAGNOSIS — IMO0001 Reserved for inherently not codable concepts without codable children: Secondary | ICD-10-CM

## 2015-06-24 DIAGNOSIS — R296 Repeated falls: Secondary | ICD-10-CM

## 2015-06-24 DIAGNOSIS — M47812 Spondylosis without myelopathy or radiculopathy, cervical region: Secondary | ICD-10-CM | POA: Diagnosis not present

## 2015-06-24 DIAGNOSIS — H6122 Impacted cerumen, left ear: Secondary | ICD-10-CM | POA: Diagnosis not present

## 2015-06-24 NOTE — Progress Notes (Signed)
Referring Physician(s): McCullough,Heath  Chief Complaint: The patient is seen in follow up today s/p successful endovascular repair of a type 2 endoleak preformed 01/08/2015  History of present illness:  81 year old male with type 2 endoleak and expanding abdominal aortic aneurysm  Successful combined coil and Onyx liquid embolic embolization of the aneurysm sac. Onyx embolization of the afferent right L3 lumbar artery.  Coil embolization of the distal segment of the right ascending iliolumbar artery effectively closing the origin of several small branch vessels providing collateral flow to the right L2 lumbar artery. Coil embolization of the inferior mesenteric artery at its origin from the abdominal aorta.  Pt has done well. Denies pain; N/V/D No complaints  CTA 06/09/15 shows  The previously seen type 2 endoleak has been resolved after from below therapy. Maximal sac diameter has diminished from 5.9 cm to 5.5 cm.  Past Medical History  Diagnosis Date  . Anxiety   . Hyperlipidemia   . Abdominal aneurysm without mention of rupture   . Insomnia, unspecified   . Colon polyps   . Arthritis   . Diverticulosis of colon (without mention of hemorrhage)   . COPD (chronic obstructive pulmonary disease)   . Inguinal hernia   . Cancer     skin cancer removal  . Anginal pain     recent hospitalization  . H/O hiatal hernia     Past Surgical History  Procedure Laterality Date  . Vasectomy  1962  . Cholecystectomy  1962  . Colon surgery  2007  . Bilateral shoulder repair    . Nasal septum surgery  2011  . Hernia repair    . Abdominal aortic endovascular stent graft N/A 10/29/2013    Procedure: ABDOMINAL AORTIC ENDOVASCULAR STENT GRAFT;  Surgeon: [name], MD;  Location: [facility] OR;  Service: Vascular;  Laterality: N/A;    Allergies: Celebrex and Codeine  Medications: Prior to Admission medications   Medication Sig Start Date End Date Taking? Authorizing Provider    aspirin 81 MG tablet Take 81 mg by mouth daily.   Yes Historical Provider, MD  B Complex-C (SUPER B COMPLEX PO) Take 1 tablet by mouth daily.    Yes Historical Provider, MD  chlorpheniramine (CHLOR-TRIMETON) 4 MG tablet Take 4-8 mg by mouth every 6 (six) hours as needed for allergies.    Yes Historical Provider, MD  Cholecalciferol (VITAMIN D) 2000 UNITS CAPS Take 2,000 Units by mouth daily.    Yes Historical Provider, MD  citalopram (CELEXA) 40 MG tablet Take 1 tablet (40 mg total) by mouth daily. 12/03/14  Yes Tiffany L Reed, DO  fexofenadine (ALLEGRA) 180 MG tablet Take 1 tablet (180 mg total) by mouth daily. 12/03/14  Yes Tiffany L Reed, DO  Glucosamine-Chondroit-Vit C-Mn (GLUCOSAMINE CHONDR 1500 COMPLX PO) Take 1 capsule by mouth daily.   Yes Historical Provider, MD  LORazepam (ATIVAN) 1 MG tablet TAKE 1/2 TABLET BY MOUTH EVERY MORNING AND 1 EVERY NIGHT AT BEDTIME. 05/17/15  Yes Tiffany L Reed, DO  Melatonin 3 MG TABS Take 2 tablets by mouth at bedtime.    Yes Historical Provider, MD  Multiple Vitamin (MULTIVITAMIN) tablet Take 1 tablet by mouth daily.   Yes Historical Provider, MD  niacin 100 MG tablet Take 250 mg by mouth daily with breakfast.    Yes Historical Provider, MD  Omega-3 Fatty Acids (FISH OIL) 1000 MG CAPS Take 1 capsule by mouth daily.   Yes Historical Provider, MD  OVER THE COUNTER MEDICATION Place 1 drop  into both eyes every morning.   Yes Historical Provider, MD  simvastatin (ZOCOR) 40 MG tablet TAKE 1 TABLET BY MOUTH AT BEDTIME FOR CHOLESTEROL 03/08/15  Yes Tiffany L Reed, DO  vitamin E 400 UNIT capsule Take 400 Units by mouth daily.   Yes Historical Provider, MD     Family History  Problem Relation Age of Onset  . Colon cancer Neg Hx   . Heart attack Father 74    Social History   Social History  . Marital Status: Married    Spouse Name: N/A  . Number of Children: N/A  . Years of Education: N/A   Occupational History  . retired    Social History Main Topics  .  Smoking status: Former Smoker -- 0.75 packs/day for 25 years    Types: Cigarettes    Quit date: 10/23/1984  . Smokeless tobacco: Never Used  . Alcohol Use: 0.6 oz/week    1 Glasses of wine per week     Comment: social  . Drug Use: No  . Sexual Activity: No   Other Topics Concern  . Not on file   Social History Narrative     Vital Signs: BP 131/66 mmHg  Pulse 68  Temp(Src) 97.8 F (36.6 C) (Oral)  Resp 14  SpO2 98%  Physical Exam  Constitutional: He is oriented to person, place, and time. He appears well-nourished.  Cardiovascular: Normal rate, regular rhythm and normal heart sounds.   Pulmonary/Chest: Effort normal and breath sounds normal. He has no wheezes.  Abdominal: Soft. Bowel sounds are normal. There is no tenderness.  Musculoskeletal: Normal range of motion.  Neurological: He is alert and oriented to person, place, and time.  Skin: Skin is warm and dry.  Psychiatric: He has a normal mood and affect. His behavior is normal. Judgment and thought content normal.  Nursing note and vitals reviewed.   Imaging: No results found.  Labs:  CBC:  Recent Labs  01/08/15 1015 06/21/15 0845  WBC 6.3 6.3  HGB 13.7  --   HCT 39.1 38.3  PLT 164  --     COAGS:  Recent Labs  01/08/15 1015  INR 1.13  APTT 31    BMP:  Recent Labs  12/01/14 1017 01/08/15 1015 06/08/15 1259 06/21/15 0845  NA  --  139  --  142  K  --  4.7  --  4.4  CL  --  108  --  103  CO2  --  25  --  22  GLUCOSE  --  100*  --  101*  BUN 23 15 24* 25  CALCIUM  --  9.3  --  9.2  CREATININE 1.50* 1.50* 1.51* 1.56*  GFRNONAA  --  40* 40* 40*  GFRAA  --  47* 46* 46*    LIVER FUNCTION TESTS:  Recent Labs  06/21/15 0845  BILITOT 0.2  AST 24  ALT 21  ALKPHOS 60  PROT 6.7    Assessment:  Endovascular repair Type 2 endoleak AAA 12/2014 Follow up CT shows decrease in size of aneurysmal sac Dr Laurence Ferrari has seen pt and has answered all questions to satisfaction Plan for repeat  CTA in approx 1 yr---to follow with Dr Trula Slade Pt leaves with good understanding of plan  Signed: Ilias Stcharles A 06/24/2015, 10:56 AM   Please refer to Dr. Laurence Ferrari attestation of this note for management and plan.

## 2015-06-24 NOTE — Progress Notes (Signed)
Passed clock test 

## 2015-06-24 NOTE — Progress Notes (Signed)
Patient ID: Andrew Vega, male   DOB: 06-Jun-1926, 81 y.o.   MRN: [id number]   Location:  [facility]  Code Status: DNR Goals of Care: Advanced Directive information Does patient have an advance directive?: Yes   Chief Complaint  Patient presents with  . Annual Exam    Annual exam  . Medical Management of Chronic Issues    Hyperlipidemia    HPI: Patient is a 81 y.o. white male seen in the office today for his annual wellness exam and medical mgt of his chronic diseases.  He asked me several questions about his neck pain and he was counseled on this.  Abdominal iliac aneurysm  repair has done very well.  He had his f/u today with Dr. Trula Slade and they will now see him back in 1 year.    HTN:  bp very good today without medications  Hyperlipidemia:  Bad cholesterol is 87.  HDL 47 and TG have decreased to 112 which is much improved.  Has gone easy on the pasta.  Is taking his zocor, niacin, and fish oil.    Arthritis:  When he turns his neck, he is having pain.  He hasn't had that for several months.  It had been that way when it all began and he required a brace and exercises.  Avoids motion that will bother him.  Tries to do ROM exercises.  This has helped.  Periodically, he may take ibuprofen.    MMSE - Mini Mental State Exam 06/24/2015 05/21/2014 03/20/2013  Orientation to time 3 4 5   Orientation to Place 5 5 5   Registration 3 3 3   Attention/ Calculation 5 5 5   Recall 3 2 2   Language- name 2 objects 2 2 2   Language- repeat 1 1 1   Language- follow 3 step command 2 3 3   Language- read & follow direction 1 1 1   Write a sentence 1 1 1   Copy design 1 1 1   Total score 27 28 29    Depression screen [facility] 2/9 03/15/2015 12/03/2014 04/30/2013 04/30/2013 03/20/2013  Decreased Interest 0 0 1 0 0  Down, Depressed, Hopeless 0 1 1 0 0  PHQ - 2 Score 0 1 2 0 0  Altered sleeping - - 1 - -  Tired, decreased energy - - 1 - -  Change in appetite - - 1 - -    Feeling bad or failure about yourself  - - 1 - -  Trouble concentrating - - 1 - -  Moving slowly or fidgety/restless - - 1 - -  Suicidal thoughts - - 1 - -  PHQ-9 Score - - 9 - -  Says spirits could be better.  Not too great truly.  His wife's dementia is what it is and he's locked into a supportive way of life.  His interaction is limited with the other residents due to lack of commonality with them.  Is going to therapy.  She asked about communicating with me.  Due to his wife, he wakes and sleeps at odd hours.  Admits he feels depressed, has poor interest, fatigue, will just not do things and read instead.  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  His role is to call and thank people for the donations.    Has a rough spot on  the back of his neck also he wants me to check on.  Is trying to watch what he eats.  Is trying to exercise on Tues and Thursday for the strength exercise session.  Feels more limber after he goes.    Fall Risk  06/24/2015 05/06/2015 03/15/2015 12/03/2014 05/21/2014  Falls in the past year? Yes Yes No No No  Number falls in past yr: 2 or more 2 or more - - -  Injury with Fall? No No - - -   Immunization History  Administered Date(s) Administered  . Influenza,inj,Quad PF,36+ Mos 07/10/2013  . Influenza-Unspecified 07/23/2014  . Pneumococcal Conjugate-13 12/03/2014  . Pneumococcal Polysaccharide-23 10/24/1993  . Td 10/24/2011   Review of Systems:  Review of Systems  Constitutional: Negative for fever, chills and malaise/fatigue.  HENT: Positive for hearing loss. Negative for congestion.        Left ear is itchy and he feels like something is in it  Eyes: Negative for blurred vision.  Respiratory: Negative for shortness of breath.   Cardiovascular: Negative for chest pain and leg swelling.  Gastrointestinal:  Negative for abdominal pain, constipation, blood in stool and melena.  Genitourinary: Negative for dysuria, urgency and frequency.  Musculoskeletal: Positive for falls and neck pain.  Skin: Positive for itching. Negative for rash.       Dry spot on back of his neck has been bothersome  Neurological: Negative for dizziness, loss of consciousness and weakness.  Endo/Heme/Allergies: Positive for environmental allergies.  Psychiatric/Behavioral: Positive for depression and memory loss. The patient is nervous/anxious.     Past Medical History  Diagnosis Date  . Anxiety   . Hyperlipidemia   . Abdominal aneurysm without mention of rupture   . Insomnia, unspecified   . Colon polyps   . Arthritis   . Diverticulosis of colon (without mention of hemorrhage)   . COPD (chronic obstructive pulmonary disease)   . Inguinal hernia   . Cancer     skin cancer removal  . Anginal pain     recent hospitalization  . H/O hiatal hernia     Past Surgical History  Procedure Laterality Date  . Vasectomy  1962  . Cholecystectomy  1962  . Colon surgery  2007  . Bilateral shoulder repair    . Nasal septum surgery  2011  . Hernia repair    . Abdominal aortic endovascular stent graft N/A 10/29/2013    Procedure: ABDOMINAL AORTIC ENDOVASCULAR STENT GRAFT;  Surgeon: [name], MD;  Location: [facility] OR;  Service: Vascular;  Laterality: N/A;    Allergies  Allergen Reactions  . Celebrex [Celecoxib] Other (See Comments)    Raised B/P   . Codeine Other (See Comments)    Severe stomach pain    Medications: Patient's Medications  New Prescriptions   No medications on file  Previous Medications   ASPIRIN 81 MG TABLET    Take 81 mg by mouth daily.   B COMPLEX-C (SUPER B COMPLEX PO)    Take 1 tablet by mouth daily.    CHLORPHENIRAMINE (CHLOR-TRIMETON) 4 MG TABLET    Take 4-8 mg by mouth every 6 (six) hours as needed for allergies.    CHOLECALCIFEROL (VITAMIN D) 2000 UNITS CAPS    Take 2,000 Units by mouth  daily.    CITALOPRAM (CELEXA) 40 MG TABLET    Take 1 tablet (40 mg total) by mouth daily.   FEXOFENADINE (ALLEGRA) 180 MG TABLET    Take 1 tablet (180 mg total) by  mouth daily.   GLUCOSAMINE-CHONDROIT-VIT C-MN (GLUCOSAMINE CHONDR 1500 COMPLX PO)    Take 1 capsule by mouth daily.   LORAZEPAM (ATIVAN) 1 MG TABLET    TAKE 1/2 TABLET BY MOUTH EVERY MORNING AND 1 EVERY NIGHT AT BEDTIME.   MELATONIN 3 MG TABS    Take 2 tablets by mouth at bedtime.    MULTIPLE VITAMIN (MULTIVITAMIN) TABLET    Take 1 tablet by mouth daily.   NIACIN 100 MG TABLET    Take 250 mg by mouth daily with breakfast.    OMEGA-3 FATTY ACIDS (FISH OIL) 1000 MG CAPS    Take 1 capsule by mouth daily.   OVER THE COUNTER MEDICATION    Place 1 drop into both eyes every morning.   SIMVASTATIN (ZOCOR) 40 MG TABLET    TAKE 1 TABLET BY MOUTH AT BEDTIME FOR CHOLESTEROL   VITAMIN E 400 UNIT CAPSULE    Take 400 Units by mouth daily.  Modified Medications   No medications on file  Discontinued Medications   No medications on file    Physical Exam: Filed Vitals:   06/24/15 1357  BP: 110/62  Pulse: 95  Temp: 97.9 F (36.6 C)  TempSrc: Oral  Resp: 20  Height: 5\' 8"  (1.727 m)  Weight: 204 lb (92.534 kg)  SpO2: 94%   Physical Exam  Constitutional: He is oriented to person, place, and time. He appears well-developed and well-nourished. No distress.  HENT:  Head: Normocephalic and atraumatic.  Right Ear: External ear normal.  Left Ear: External ear normal.  Nose: Nose normal.  Mouth/Throat: Oropharynx is clear and moist. No oropharyngeal exudate.  Left ear with large piece of hard wax present in ear canal  Eyes: Conjunctivae and EOM are normal. Pupils are equal, round, and reactive to light.  Neck: Normal range of motion. Neck supple. No JVD present. No tracheal deviation present. No thyromegaly present.  Cardiovascular: Regular rhythm, normal heart sounds and intact distal pulses.   Mild tachy during exam  Pulmonary/Chest:  Effort normal and breath sounds normal. No respiratory distress.  Abdominal: Soft. Bowel sounds are normal. He exhibits no distension. There is no tenderness.  Musculoskeletal: Normal range of motion. He exhibits tenderness.  Of paravertebral muscles, very tight trapezius muscles  Lymphadenopathy:    He has no cervical adenopathy.  Neurological: He is alert and oriented to person, place, and time. He has normal reflexes. No cranial nerve deficit.  Skin: Skin is warm and dry.  Dry skin on back of neck  Psychiatric: He has a normal mood and affect. His behavior is normal.  A bit tremulous today    Labs reviewed: Basic Metabolic Panel:  Recent Labs  01/08/15 1015 06/08/15 1259 06/21/15 0845  NA 139  --  142  K 4.7  --  4.4  CL 108  --  103  CO2 25  --  22  GLUCOSE 100*  --  101*  BUN 15 24* 25  CREATININE 1.50* 1.51* 1.56*  CALCIUM 9.3  --  9.2   Liver Function Tests:  Recent Labs  06/21/15 0845  AST 24  ALT 21  ALKPHOS 60  BILITOT 0.2  PROT 6.7   No results for input(s): LIPASE, AMYLASE in the last 8760 hours. No results for input(s): AMMONIA in the last 8760 hours. CBC:  Recent Labs  01/08/15 1015 06/21/15 0845  WBC 6.3 6.3  NEUTROABS 3.5 3.6  HGB 13.7  --   HCT 39.1 38.3  MCV 89.3  --  PLT 164  --    Lipid Panel:  Recent Labs  06/21/15 0845  CHOL 156  HDL 47  LDLCALC 87  TRIG 112  CHOLHDL 3.3   No results found for: HGBA1C  Procedures since last visit: 06/07/15:  Had vascular carotid duplex bilateral 06/08/15:  Vascular US Evar Duplex 06/08/15:  Vas US carotid  Assessment/Plan 1. Falls frequently - had most recent fall when returning to [place] with cart for groceries and the cart folded up and fell and so did he -will consider outpatient PT for him in the near future  2. Left ear impacted cerumen -ear was flushed by CMA with warm water and peroxide using metal syringe and then I used a plastic curette to pull out a large piece of hard  cerumen that was not coming out with the flushing alone  3. Endoleak post (EVAR) endovascular aneurysm repair, subsequent encounter -resolved -f/u with Dr. Trula Slade in 1 year as planned  4. Generalized osteoarthritis of multiple sites -including neck, knees, hands especially -cont exercises for neck, regular walking  5. Cervical osteoarthritis -cont exercises, stretches, recommended a therapeutic massage for him b/c he has tight muscles -if persists, will obtain xrays and PT referral to assist with conservative pain mgt  6. Insomnia -more like disturbed sleep cycles due to caregiver stress related to his wife's dementia -gets limited sleep due to her routine -encouraged him to allow the caregivers to do their job -cont melatonin  7. Depression -cont celexa and as needed ativan for his nerves -also cont psychotherapy with Carmina Miller LCSW who wanted to speak with me--I agree this is a good idea b/c I believe a lot of his pain and tension are related to his anxiety and stress  8. Hyperlipidemia -LDL is pretty good, TG improved, cont zocor, fish oil, niacin  9. Need for prophylactic vaccination and inoculation against influenza -flu shot was given  Labs/tests ordered:   Orders Placed This Encounter  Procedures  . Flu Vaccine QUAD 36+ mos IM    Next appt:  3 mos with Jessica  Soren Lazarz L. Trino Higinbotham, D.O. [facility] [address] [address], [place] [address] Cell Phone (Mon-Fri 8am-5pm):  [phone] On Call:  [phone] & follow prompts after 5pm & weekends Office Phone:  [phone] Office Fax:  [phone]

## 2015-06-27 ENCOUNTER — Other Ambulatory Visit: Payer: Self-pay | Admitting: Internal Medicine

## 2015-06-30 ENCOUNTER — Emergency Department (INDEPENDENT_AMBULATORY_CARE_PROVIDER_SITE_OTHER): Payer: Medicare Other

## 2015-06-30 ENCOUNTER — Encounter: Payer: Self-pay | Admitting: Podiatry

## 2015-06-30 ENCOUNTER — Ambulatory Visit (INDEPENDENT_AMBULATORY_CARE_PROVIDER_SITE_OTHER): Payer: Medicare Other | Admitting: Podiatry

## 2015-06-30 ENCOUNTER — Encounter (HOSPITAL_COMMUNITY): Payer: Self-pay | Admitting: *Deleted

## 2015-06-30 ENCOUNTER — Emergency Department (INDEPENDENT_AMBULATORY_CARE_PROVIDER_SITE_OTHER)
Admission: EM | Admit: 2015-06-30 | Discharge: 2015-06-30 | Disposition: A | Discharge status: Home / Self Care | Payer: Medicare Other | Source: Home / Self Care | Attending: Family Medicine | Admitting: Family Medicine

## 2015-06-30 VITALS — BP 118/76 | HR 81 | Resp 17

## 2015-06-30 DIAGNOSIS — M79674 Pain in right toe(s): Secondary | ICD-10-CM

## 2015-06-30 DIAGNOSIS — S63502A Unspecified sprain of left wrist, initial encounter: Secondary | ICD-10-CM

## 2015-06-30 DIAGNOSIS — M79675 Pain in left toe(s): Secondary | ICD-10-CM | POA: Diagnosis not present

## 2015-06-30 DIAGNOSIS — B351 Tinea unguium: Secondary | ICD-10-CM | POA: Diagnosis not present

## 2015-06-30 DIAGNOSIS — S6392XA Sprain of unspecified part of left wrist and hand, initial encounter: Secondary | ICD-10-CM

## 2015-06-30 DIAGNOSIS — M79676 Pain in unspecified toe(s): Secondary | ICD-10-CM

## 2015-06-30 DIAGNOSIS — Z043 Encounter for examination and observation following other accident: Secondary | ICD-10-CM | POA: Diagnosis not present

## 2015-06-30 NOTE — ED Provider Notes (Signed)
CSN: [id number]     Arrival date & time 06/30/15  1813 History   First MD Initiated Contact with Patient 06/30/15 1848     Chief Complaint  Patient presents with  . Hand Injury   (Consider location/radiation/quality/duration/timing/severity/associated sxs/prior Treatment) Patient is a 81 y.o. male presenting with hand injury. The history is provided by the patient.  Hand Injury Location:  Wrist and hand Time since incident:  2 hours Injury: yes   Mechanism of injury: fall   Fall:    Fall occurred:  Walking   Impact surface:  Chief Strategy Officer of impact:  Hands   Entrapped after fall: no   Wrist location:  L wrist Hand location:  L hand Pain details:    Radiates to:  Does not radiate   Severity:  Mild   Onset quality:  Sudden   Progression:  Unchanged Chronicity:  New Handedness:  Left-handed Dislocation: no   Prior injury to area:  No Relieved by:  None tried Worsened by:  Nothing tried Ineffective treatments:  None tried Associated symptoms: decreased range of motion and swelling   Associated symptoms: no back pain and no fever   Risk factors: no concern for non-accidental trauma     Past Medical History  Diagnosis Date  . Anxiety   . Hyperlipidemia   . Abdominal aneurysm without mention of rupture   . Insomnia, unspecified   . Colon polyps   . Arthritis   . Diverticulosis of colon (without mention of hemorrhage)   . COPD (chronic obstructive pulmonary disease)   . Inguinal hernia   . Cancer     skin cancer removal  . Anginal pain     recent hospitalization  . H/O hiatal hernia    Past Surgical History  Procedure Laterality Date  . Vasectomy  1962  . Cholecystectomy  1962  . Colon surgery  2007  . Bilateral shoulder repair    . Nasal septum surgery  2011  . Hernia repair    . Abdominal aortic endovascular stent graft N/A 10/29/2013    Procedure: ABDOMINAL AORTIC ENDOVASCULAR STENT GRAFT;  Surgeon: [name], MD;  Location: [facility] OR;  Service: Vascular;   Laterality: N/A;   Family History  Problem Relation Age of Onset  . Colon cancer Neg Hx   . Heart attack Father 51   Social History  Substance Use Topics  . Smoking status: Former Smoker -- 0.75 packs/day for 25 years    Types: Cigarettes    Quit date: 10/23/1984  . Smokeless tobacco: Never Used  . Alcohol Use: 0.6 oz/week    1 Glasses of wine per week     Comment: social    Review of Systems  Constitutional: Negative for fever.  Musculoskeletal: Positive for joint swelling. Negative for myalgias, back pain and gait problem.  Skin: Positive for wound.  All other systems reviewed and are negative.   Allergies  Celebrex and Codeine  Home Medications   Prior to Admission medications   Medication Sig Start Date End Date Taking? Authorizing Provider  aspirin 81 MG tablet Take 81 mg by mouth daily.    Historical Provider, MD  B Complex-C (SUPER B COMPLEX PO) Take 1 tablet by mouth daily.     Historical Provider, MD  chlorpheniramine (CHLOR-TRIMETON) 4 MG tablet Take 4-8 mg by mouth every 6 (six) hours as needed for allergies.     Historical Provider, MD  Cholecalciferol (VITAMIN D) 2000 UNITS CAPS Take 2,000 Units by mouth  daily.     Historical Provider, MD  citalopram (CELEXA) 40 MG tablet Take 1 tablet (40 mg total) by mouth daily. 12/03/14   Tiffany L Reed, DO  fexofenadine (ALLEGRA) 180 MG tablet Take 1 tablet (180 mg total) by mouth daily. 12/03/14   Tiffany L Reed, DO  Glucosamine-Chondroit-Vit C-Mn (GLUCOSAMINE CHONDR 1500 COMPLX PO) Take 1 capsule by mouth daily.    Historical Provider, MD  LORazepam (ATIVAN) 1 MG tablet TAKE 0.5 TABLET BY MOUTH EVERY MORNING AND TAKE 1 TABLET BY MOUTH EVERY NIGHT AT BEDTIME 06/29/15   Lauree Chandler, NP  Melatonin 3 MG TABS Take 2 tablets by mouth at bedtime.     Historical Provider, MD  Multiple Vitamin (MULTIVITAMIN) tablet Take 1 tablet by mouth daily.    Historical Provider, MD  niacin 100 MG tablet Take 250 mg by mouth daily with  breakfast.     Historical Provider, MD  Omega-3 Fatty Acids (FISH OIL) 1000 MG CAPS Take 1 capsule by mouth daily.    Historical Provider, MD  OVER THE COUNTER MEDICATION Place 1 drop into both eyes every morning.    Historical Provider, MD  simvastatin (ZOCOR) 40 MG tablet TAKE 1 TABLET BY MOUTH AT BEDTIME FOR CHOLESTEROL 03/08/15   Tiffany L Reed, DO  vitamin E 400 UNIT capsule Take 400 Units by mouth daily.    Historical Provider, MD   Meds Ordered and Administered this Visit  Medications - No data to display  BP 141/73 mmHg  Pulse 88  Temp(Src) 98.3 F (36.8 C) (Oral)  Resp 16  SpO2 95% No data found.   Physical Exam  Constitutional: He is oriented to person, place, and time. He appears well-developed and well-nourished. No distress.  Musculoskeletal: He exhibits tenderness.       Left wrist: He exhibits decreased range of motion, tenderness, bony tenderness, swelling and deformity.       Arms:      Hands: Neurological: He is alert and oriented to person, place, and time.  Skin: Skin is warm and dry.  Minor finger abrasions on left.  Nursing note and vitals reviewed.   ED Course  Procedures (including critical care time)  Labs Review Labs Reviewed - No data to display  Imaging Review Dg Hand Complete Left  06/30/2015   CLINICAL DATA:  81 year old male with fall onto the left hand.  EXAM: LEFT HAND - COMPLETE 3+ VIEW  COMPARISON:  None.  FINDINGS: There is no acute fracture or dislocation. The bones are osteopenic. There is a osteoarthritic changes of the first carpometacarpal joint. There is narrowing of the distal interphalangeal joint space with erosive osteoarthritic changes. The soft tissues are unremarkable. No radiopaque foreign object identified.  IMPRESSION: No acute fracture or dislocation.   Electronically Signed   By: Anner Crete M.D.   On: 06/30/2015 19:06   X-rays reviewed and report per radiologist.   Visual Acuity Review  Right Eye Distance:   Left  Eye Distance:   Bilateral Distance:    Right Eye Near:   Left Eye Near:    Bilateral Near:         MDM   1. Sprain of left wrist, initial encounter   2. Hand sprain, left, initial encounter        Billy Fischer, MD 06/30/15 [phone]

## 2015-06-30 NOTE — Progress Notes (Signed)
Patient ID: Andrew Vega, male   DOB: 12/16/1925, 81 y.o.   MRN: [id number] Complaint:  Visit Type: Patient returns to my office for continued preventative foot care services. Complaint: Patient states" my nails have grown long and thick and become painful to walk and wear shoes" Patient has been diagnosed with DM with no complications. He presents for preventative foot care services. No changes to ROS  Podiatric Exam: Vascular: dorsalis pedis and posterior tibial pulses are palpable bilateral. Capillary return is immediate. Temperature gradient is WNL. Skin turgor WNL  Sensorium: Normal Semmes Weinstein monofilament test. Normal tactile sensation bilaterally. Nail Exam: Pt has thick disfigured discolored nails with subungual debris noted bilateral entire nail hallux  Ulcer Exam: There is no evidence of ulcer or pre-ulcerative changes or infection. Orthopedic Exam: Muscle tone and strength are WNL. No limitations in general ROM. No crepitus or effusions noted. Foot type and digits show no abnormalities. Bony prominences are unremarkable. Skin: No Porokeratosis. No infection or ulcers.  Heloma durum fifth toe right foot  Diagnosis:  Tinea unguium, Pain in right toe, pain in left toes  Treatment & Plan Procedures and Treatment: Consent by patient was obtained for treatment procedures. The patient understood the discussion of treatment and procedures well. All questions were answered thoroughly reviewed. Debridement of mycotic and hypertrophic toenails, 1 through 5 bilateral and clearing of subungual debris. No ulceration, no infection noted.  Return Visit-Office Procedure: Patient instructed to return to the office for a follow up visit 3 months for continued evaluation and treatment. 

## 2015-06-30 NOTE — Discharge Instructions (Signed)
Ice, splint of wrist and advil for pain as needed, see orthopedist next week if further pain and swelling

## 2015-06-30 NOTE — ED Notes (Signed)
Pt  Reports  He  Felled   And  inj      His  l  Hand /  Wrist     He  Has  Some  Swelling  And  Deformity        with pain  On  Palpation

## 2015-07-05 DIAGNOSIS — F330 Major depressive disorder, recurrent, mild: Secondary | ICD-10-CM | POA: Diagnosis not present

## 2015-07-06 ENCOUNTER — Telehealth: Payer: Self-pay

## 2015-07-06 NOTE — Telephone Encounter (Signed)
Per Dr.Reed:  Andrew Vega had a fall and is to follow up with orthopedics about his hand/wrist injury. I would like for him to have home health PT, OT at [facility] if he's agreeable. Please call him and make recommendation. I believe they may have a company over there that they use so he probably needs a script with the orders written on it to send over to [facility].  Left message on voicemail for patient to return call when available

## 2015-07-07 NOTE — Telephone Encounter (Signed)
Left message on voicemail for patient to return call when available   

## 2015-07-07 NOTE — Telephone Encounter (Signed)
Patient has an appointment with Orthopedics tomorrow and will see what they recommend. Patient will call back if he would like to persue PT/OT at [facility] after checking with insurance company

## 2015-07-08 DIAGNOSIS — M19042 Primary osteoarthritis, left hand: Secondary | ICD-10-CM | POA: Diagnosis not present

## 2015-08-05 DIAGNOSIS — M19042 Primary osteoarthritis, left hand: Secondary | ICD-10-CM | POA: Diagnosis not present

## 2015-08-08 ENCOUNTER — Other Ambulatory Visit: Payer: Self-pay | Admitting: Nurse Practitioner

## 2015-08-26 DIAGNOSIS — M19042 Primary osteoarthritis, left hand: Secondary | ICD-10-CM | POA: Diagnosis not present

## 2015-08-30 ENCOUNTER — Other Ambulatory Visit: Payer: Self-pay | Admitting: Internal Medicine

## 2015-09-14 ENCOUNTER — Other Ambulatory Visit: Payer: Self-pay | Admitting: Internal Medicine

## 2015-09-23 ENCOUNTER — Ambulatory Visit (INDEPENDENT_AMBULATORY_CARE_PROVIDER_SITE_OTHER): Payer: Medicare Other | Admitting: Nurse Practitioner

## 2015-09-23 ENCOUNTER — Encounter: Payer: Self-pay | Admitting: Nurse Practitioner

## 2015-09-23 VITALS — BP 108/68 | HR 77 | Temp 97.5°F | Resp 20 | Ht 68.0 in | Wt 204.8 lb

## 2015-09-23 DIAGNOSIS — F411 Generalized anxiety disorder: Secondary | ICD-10-CM | POA: Diagnosis not present

## 2015-09-23 DIAGNOSIS — J309 Allergic rhinitis, unspecified: Secondary | ICD-10-CM

## 2015-09-23 DIAGNOSIS — I6523 Occlusion and stenosis of bilateral carotid arteries: Secondary | ICD-10-CM

## 2015-09-23 DIAGNOSIS — R296 Repeated falls: Secondary | ICD-10-CM | POA: Diagnosis not present

## 2015-09-23 DIAGNOSIS — S62102D Fracture of unspecified carpal bone, left wrist, subsequent encounter for fracture with routine healing: Secondary | ICD-10-CM | POA: Diagnosis not present

## 2015-09-23 NOTE — Patient Instructions (Addendum)
May use tylenol 500 mg 1-2 tablets every 8 hours as needed for pain  Increase activity, more walking but be cautious

## 2015-09-23 NOTE — Progress Notes (Signed)
Patient ID: Andrew Vega, male   DOB: [birth year], 81 y.o.   MRN: [id number]    PCP: Hollace Kinnier, DO  Advanced Directive information Does patient have an advance directive?: Yes  Allergies  Allergen Reactions  . Celebrex [Celecoxib] Other (See Comments)    Raised B/P   . Codeine Other (See Comments)    Severe stomach pain     Chief Complaint  Patient presents with  . Medical Management of Chronic Issues    3 month follow-up for Hyperlipidemia, and Depression     HPI: Patient is a 81 y.o. male seen in the office today for routine follow up. Everything going good per patient except he has had another fall and fractured wrist, wearing brace at this time.  Caught his foot on the corner trying to get around a group of people talking and feel and braced himself with his hand. Following with orthopedics. Next appt in January. Pain under control as long as he does not over do it. Does feel an ache in the evening.  Anxiety is okay, feels more of lack of interest. Reading more. Not a lot of him to do. Denies depression, just bored in current living situation.  conts to take celexa 40 mg daily and lorazepam.   Review of Systems:  Review of Systems  Constitutional: Negative for fever and chills.  HENT: Positive for hearing loss. Negative for congestion.   Respiratory: Negative for shortness of breath.   Cardiovascular: Negative for chest pain and leg swelling.  Gastrointestinal: Negative for abdominal pain, constipation and blood in stool.  Genitourinary: Negative for dysuria, urgency and frequency.  Musculoskeletal: Positive for arthralgias (wrist pain).  Skin: Negative for rash.  Allergic/Immunologic: Positive for environmental allergies.  Neurological: Negative for dizziness and weakness.  Psychiatric/Behavioral: The patient is nervous/anxious.        Memory loss noted    Past Medical History  Diagnosis Date  . Anxiety   . Hyperlipidemia   . Abdominal aneurysm without mention  of rupture   . Insomnia, unspecified   . Colon polyps   . Arthritis   . Diverticulosis of colon (without mention of hemorrhage)   . COPD (chronic obstructive pulmonary disease) ([place])   . Inguinal hernia   . Cancer ([place])     skin cancer removal  . Anginal pain ([place])     recent hospitalization  . H/O hiatal hernia    Past Surgical History  Procedure Laterality Date  . Vasectomy  1962  . Cholecystectomy  1962  . Colon surgery  2007  . Bilateral shoulder repair    . Nasal septum surgery  2011  . Hernia repair    . Abdominal aortic endovascular stent graft N/A 10/29/2013    Procedure: ABDOMINAL AORTIC ENDOVASCULAR STENT GRAFT;  Surgeon: [name], MD;  Location: [facility] OR;  Service: Vascular;  Laterality: N/A;   Social History:   reports that he quit smoking about 30 years ago. His smoking use included Cigarettes. He has a 18.75 pack-year smoking history. He has never used smokeless tobacco. He reports that he drinks about 0.6 oz of alcohol per week. He reports that he does not use illicit drugs.  Family History  Problem Relation Age of Onset  . Colon cancer Neg Hx   . Heart attack Father 91    Medications: Patient's Medications  New Prescriptions   No medications on file  Previous Medications   ASPIRIN 81 MG TABLET    Take 81 mg by mouth daily.  B COMPLEX-C (SUPER B COMPLEX PO)    Take 1 tablet by mouth daily.    CHLORPHENIRAMINE (CHLOR-TRIMETON) 4 MG TABLET    Take 4-8 mg by mouth every 6 (six) hours as needed for allergies.    CHOLECALCIFEROL (VITAMIN D) 2000 UNITS CAPS    Take 2,000 Units by mouth daily.    CITALOPRAM (CELEXA) 40 MG TABLET    Take 1 tablet (40 mg total) by mouth daily.   FEXOFENADINE (ALLEGRA) 180 MG TABLET    Take 1 tablet (180 mg total) by mouth daily.   GLUCOSAMINE-CHONDROIT-VIT C-MN (GLUCOSAMINE CHONDR 1500 COMPLX PO)    Take 1 capsule by mouth daily.   LORAZEPAM (ATIVAN) 1 MG TABLET    TAKE 1/2 TABLET BY MOUTH EVERY MORNING AND 1 TABLET EVERY NIGHT  AT BEDTIME   MELATONIN 3 MG TABS    Take 2 tablets by mouth at bedtime.    MULTIPLE VITAMIN (MULTIVITAMIN) TABLET    Take 1 tablet by mouth daily.   NIACIN 100 MG TABLET    Take 250 mg by mouth daily with breakfast.    OMEGA-3 FATTY ACIDS (FISH OIL) 1000 MG CAPS    Take 1 capsule by mouth daily.   OVER THE COUNTER MEDICATION    Place 1 drop into both eyes every morning.   SIMVASTATIN (ZOCOR) 40 MG TABLET    TAKE 1 TABLET BY MOUTH AT BEDTIME FOR CHOLESTEROL   VITAMIN E 400 UNIT CAPSULE    Take 400 Units by mouth daily.  Modified Medications   No medications on file  Discontinued Medications   No medications on file     Physical Exam:  Filed Vitals:   09/23/15 1518  BP: 108/68  Pulse: 77  Temp: 97.5 F (36.4 C)  TempSrc: Oral  Resp: 20  Height: 5\' 8"  (1.727 m)  Weight: 204 lb 12.8 oz (92.897 kg)  SpO2: 95%   Body mass index is 31.15 kg/(m^2).  Physical Exam  Constitutional: He is oriented to person, place, and time. He appears well-developed and well-nourished. No distress.  HENT:  Head: Normocephalic and atraumatic.  Right Ear: External ear normal.  Left Ear: External ear normal.  Nose: Nose normal.  Mouth/Throat: Oropharynx is clear and moist. No oropharyngeal exudate.  Eyes: Conjunctivae and EOM are normal. Pupils are equal, round, and reactive to light.  Neck: Normal range of motion. Neck supple.  Cardiovascular: Normal rate, regular rhythm and normal heart sounds.   Pulmonary/Chest: Effort normal and breath sounds normal.  Abdominal: Soft. Bowel sounds are normal.  Musculoskeletal: He exhibits no edema or tenderness.  Slow gait   Neurological: He is alert and oriented to person, place, and time.  Skin: Skin is warm and dry. He is not diaphoretic.  Psychiatric: He has a normal mood and affect.    Labs reviewed: Basic Metabolic Panel:  Recent Labs  01/08/15 1015 06/08/15 1259 06/21/15 0845  NA 139  --  142  K 4.7  --  4.4  CL 108  --  103  CO2 25  --  22   GLUCOSE 100*  --  101*  BUN 15 24* 25  CREATININE 1.50* 1.51* 1.56*  CALCIUM 9.3  --  9.2   Liver Function Tests:  Recent Labs  06/21/15 0845  AST 24  ALT 21  ALKPHOS 60  BILITOT 0.2  PROT 6.7  ALBUMIN 4.0   No results for input(s): LIPASE, AMYLASE in the last 8760 hours. No results for input(s): AMMONIA in the  last 8760 hours. CBC:  Recent Labs  01/08/15 1015 06/21/15 0845  WBC 6.3 6.3  NEUTROABS 3.5 3.6  HGB 13.7  --   HCT 39.1 38.3  MCV 89.3  --   PLT 164  --    Lipid Panel:  Recent Labs  06/21/15 0845  CHOL 156  HDL 47  LDLCALC 87  TRIG 112  CHOLHDL 3.3   TSH: No results for input(s): TSH in the last 8760 hours. A1C: No results found for: HGBA1C   Assessment/Plan 1. Left wrist fracture, with routine healing, subsequent encounter -following with ortho, wrist brace in place. Pain controlled.  - May use tylenol 500 mg 1-2 tablets every 8 hours as needed for pain 2. Falls frequently -recent fall with left wrist fracture. Fall precautions discussed.   3. Anxiety state -ongoing, stable at this time conts on celexa 40 mg daily with PRN   4. Allergic rhinitis, unspecified allergic rhinitis type -stable, conts on allergra daily   Follow up in 4 months with Dr Sharee Holster K. Harle Battiest  [facility] [phone] 8 am - 5 pm) [phone] (after hours)

## 2015-09-28 DIAGNOSIS — S52532D Colles' fracture of left radius, subsequent encounter for closed fracture with routine healing: Secondary | ICD-10-CM | POA: Diagnosis not present

## 2015-09-28 DIAGNOSIS — M1811 Unilateral primary osteoarthritis of first carpometacarpal joint, right hand: Secondary | ICD-10-CM | POA: Diagnosis not present

## 2015-09-30 ENCOUNTER — Ambulatory Visit: Payer: Medicare Other | Admitting: Podiatry

## 2015-10-06 ENCOUNTER — Ambulatory Visit: Payer: Medicare Other | Admitting: Podiatry

## 2015-10-07 ENCOUNTER — Encounter: Payer: Self-pay | Admitting: Podiatry

## 2015-10-07 ENCOUNTER — Ambulatory Visit (INDEPENDENT_AMBULATORY_CARE_PROVIDER_SITE_OTHER): Payer: Medicare Other | Admitting: Podiatry

## 2015-10-07 DIAGNOSIS — M79676 Pain in unspecified toe(s): Secondary | ICD-10-CM | POA: Diagnosis not present

## 2015-10-07 DIAGNOSIS — M79674 Pain in right toe(s): Secondary | ICD-10-CM | POA: Diagnosis not present

## 2015-10-07 DIAGNOSIS — M79675 Pain in left toe(s): Secondary | ICD-10-CM

## 2015-10-07 DIAGNOSIS — B351 Tinea unguium: Secondary | ICD-10-CM | POA: Diagnosis not present

## 2015-10-07 NOTE — Progress Notes (Signed)
Patient ID: Andrew Vega, male   DOB: 01/17/1926, 81 y.o.   MRN: [id number] Complaint:  Visit Type: Patient returns to my office for continued preventative foot care services. Complaint: Patient states" my nails have grown long and thick and become painful to walk and wear shoes" Patient has been diagnosed with DM with no complications. He presents for preventative foot care services. No changes to ROS  Podiatric Exam: Vascular: dorsalis pedis and posterior tibial pulses are palpable bilateral. Capillary return is immediate. Temperature gradient is WNL. Skin turgor WNL  Sensorium: Normal Semmes Weinstein monofilament test. Normal tactile sensation bilaterally. Nail Exam: Pt has thick disfigured discolored nails with subungual debris noted bilateral entire nail hallux  Ulcer Exam: There is no evidence of ulcer or pre-ulcerative changes or infection. Orthopedic Exam: Muscle tone and strength are WNL. No limitations in general ROM. No crepitus or effusions noted. Foot type and digits show no abnormalities. Bony prominences are unremarkable. Skin: No Porokeratosis. No infection or ulcers.  Heloma durum fifth toe right foot  Diagnosis:  Tinea unguium, Pain in right toe, pain in left toes  Treatment & Plan Procedures and Treatment: Consent by patient was obtained for treatment procedures. The patient understood the discussion of treatment and procedures well. All questions were answered thoroughly reviewed. Debridement of mycotic and hypertrophic toenails, 1 through 5 bilateral and clearing of subungual debris. No ulceration, no infection noted.  Return Visit-Office Procedure: Patient instructed to return to the office for a follow up visit 3 months for continued evaluation and treatment.  Gardiner Barefoot DPM

## 2015-10-14 DIAGNOSIS — C44712 Basal cell carcinoma of skin of right lower limb, including hip: Secondary | ICD-10-CM | POA: Diagnosis not present

## 2015-10-14 DIAGNOSIS — C44719 Basal cell carcinoma of skin of left lower limb, including hip: Secondary | ICD-10-CM | POA: Diagnosis not present

## 2015-10-14 DIAGNOSIS — L218 Other seborrheic dermatitis: Secondary | ICD-10-CM | POA: Diagnosis not present

## 2015-10-14 DIAGNOSIS — L814 Other melanin hyperpigmentation: Secondary | ICD-10-CM | POA: Diagnosis not present

## 2015-10-14 DIAGNOSIS — D1801 Hemangioma of skin and subcutaneous tissue: Secondary | ICD-10-CM | POA: Diagnosis not present

## 2015-10-14 DIAGNOSIS — L570 Actinic keratosis: Secondary | ICD-10-CM | POA: Diagnosis not present

## 2015-10-14 DIAGNOSIS — L821 Other seborrheic keratosis: Secondary | ICD-10-CM | POA: Diagnosis not present

## 2015-10-14 DIAGNOSIS — D225 Melanocytic nevi of trunk: Secondary | ICD-10-CM | POA: Diagnosis not present

## 2015-10-24 DIAGNOSIS — C801 Malignant (primary) neoplasm, unspecified: Secondary | ICD-10-CM

## 2015-10-24 HISTORY — DX: Malignant (primary) neoplasm, unspecified: C80.1

## 2015-10-26 DIAGNOSIS — M1811 Unilateral primary osteoarthritis of first carpometacarpal joint, right hand: Secondary | ICD-10-CM | POA: Diagnosis not present

## 2015-10-26 DIAGNOSIS — S52532D Colles' fracture of left radius, subsequent encounter for closed fracture with routine healing: Secondary | ICD-10-CM | POA: Diagnosis not present

## 2015-11-02 ENCOUNTER — Other Ambulatory Visit: Payer: Self-pay | Admitting: Nurse Practitioner

## 2015-11-06 ENCOUNTER — Other Ambulatory Visit: Payer: Self-pay | Admitting: Internal Medicine

## 2015-11-16 DIAGNOSIS — C44712 Basal cell carcinoma of skin of right lower limb, including hip: Secondary | ICD-10-CM | POA: Diagnosis not present

## 2015-11-16 DIAGNOSIS — Z85828 Personal history of other malignant neoplasm of skin: Secondary | ICD-10-CM | POA: Diagnosis not present

## 2015-11-24 HISTORY — PX: MOHS SURGERY: SHX181

## 2015-11-30 ENCOUNTER — Encounter: Payer: Self-pay | Admitting: Cardiology

## 2015-12-07 ENCOUNTER — Encounter: Payer: Self-pay | Admitting: Family

## 2015-12-13 ENCOUNTER — Ambulatory Visit (HOSPITAL_COMMUNITY)
Admission: RE | Admit: 2015-12-13 | Discharge: 2015-12-13 | Disposition: A | Discharge status: Home / Self Care | Payer: Medicare Other | Source: Ambulatory Visit | Attending: Family | Admitting: Family

## 2015-12-13 ENCOUNTER — Ambulatory Visit (INDEPENDENT_AMBULATORY_CARE_PROVIDER_SITE_OTHER): Payer: Medicare Other | Admitting: Family

## 2015-12-13 ENCOUNTER — Ambulatory Visit (INDEPENDENT_AMBULATORY_CARE_PROVIDER_SITE_OTHER)
Admission: RE | Admit: 2015-12-13 | Discharge: 2015-12-13 | Disposition: A | Discharge status: Home / Self Care | Payer: Medicare Other | Source: Ambulatory Visit | Attending: Family | Admitting: Family

## 2015-12-13 ENCOUNTER — Encounter: Payer: Self-pay | Admitting: Family

## 2015-12-13 VITALS — BP 124/76 | HR 71 | Temp 97.4°F | Resp 16 | Ht 68.5 in | Wt 204.0 lb

## 2015-12-13 DIAGNOSIS — I714 Abdominal aortic aneurysm, without rupture, unspecified: Secondary | ICD-10-CM

## 2015-12-13 DIAGNOSIS — IMO0001 Reserved for inherently not codable concepts without codable children: Secondary | ICD-10-CM

## 2015-12-13 DIAGNOSIS — I6523 Occlusion and stenosis of bilateral carotid arteries: Secondary | ICD-10-CM | POA: Diagnosis not present

## 2015-12-13 DIAGNOSIS — Z48812 Encounter for surgical aftercare following surgery on the circulatory system: Secondary | ICD-10-CM | POA: Diagnosis not present

## 2015-12-13 DIAGNOSIS — T82330D Leakage of aortic (bifurcation) graft (replacement), subsequent encounter: Secondary | ICD-10-CM | POA: Diagnosis not present

## 2015-12-13 NOTE — Progress Notes (Signed)
[facility]  Established EVAR  History of Present Illness  Andrew Vega is a 82 y.o. ([birth year]) male patient of Dr. Trula Slade who is back today for postoperative EVAR surveillance. 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 Maximum aortic size was 5.3 cm. Dr. Trula Slade placed a proximal extension for a type I endoleak. This was originally scheduled to be performed by Dr. Kellie Simmering, however he had a family emergency and therefore Dr. Trula Slade did the procedure. The patient's postoperative course was uncomplicated.   He had a type II endoleak which was associated with aneurysm sac expansion. Therefore he underwent embolization in March 2016. He is here for follow-up of his abdominal aneurysm as well as his carotid occlusive disease which is asymptomatic.  Pt denies any history of stroke or TIA.  He denies claudication sx's with walking.   Pt Diabetic: No Pt smoker: former smoker, quit in 1986   Past Medical History  Diagnosis Date  . Anxiety   . Hyperlipidemia   . Abdominal aneurysm without mention of rupture   . Insomnia, unspecified   . Colon polyps   . Arthritis   . Diverticulosis of colon (without mention of hemorrhage)   . COPD (chronic obstructive pulmonary disease) ([place])   . Inguinal hernia   . Anginal pain ([place])     recent hospitalization  . H/O hiatal hernia   . Cancer [facility]) Jan. 2017    skin cancer removal/ Right medial lower Leg   Past Surgical History  Procedure Laterality Date  . Vasectomy  1962  . Cholecystectomy  1962  . Colon surgery  2007  . Bilateral shoulder repair    . Nasal septum surgery  2011  . Hernia repair    . Abdominal aortic endovascular stent graft N/A 10/29/2013    Procedure: ABDOMINAL AORTIC ENDOVASCULAR STENT GRAFT;  Surgeon: [name], MD;  Location: [facility] OR;  Service: Vascular;  Laterality: N/A;   Social History Social History  Substance Use Topics  . Smoking status:  Former Smoker -- 0.75 packs/day for 25 years    Types: Cigarettes    Quit date: 10/23/1984  . Smokeless tobacco: Never Used  . Alcohol Use: 0.6 oz/week    1 Glasses of wine per week     Comment: social   Family History Family History  Problem Relation Age of Onset  . Colon cancer Neg Hx   . Heart attack Father 86   Current Outpatient Prescriptions on File Prior to Visit  Medication Sig Dispense Refill  . aspirin 81 MG tablet Take 81 mg by mouth daily.    . B Complex-C (SUPER B COMPLEX PO) Take 1 tablet by mouth daily.     . chlorpheniramine (CHLOR-TRIMETON) 4 MG tablet Take 4-8 mg by mouth every 6 (six) hours as needed for allergies.     . Cholecalciferol (VITAMIN D) 2000 UNITS CAPS Take 2,000 Units by mouth daily.     . citalopram (CELEXA) 40 MG tablet TAKE 1 TABLET BY MOUTH EVERY DAY. 90 tablet 0  . fexofenadine (ALLEGRA) 180 MG tablet Take 1 tablet (180 mg total) by mouth daily. 30 tablet 0  . Glucosamine-Chondroit-Vit C-Mn (GLUCOSAMINE CHONDR 1500 COMPLX PO) Take 1 capsule by mouth daily.    Marland Kitchen LORazepam (ATIVAN) 1 MG tablet TAKE 1/2 TABLET BY MOUTH EVERY MORNING AND 1 TABLET BY MOUTH EVERY NIGHT AT BEDTIME 45 tablet 0  . Melatonin 3 MG TABS Take 2 tablets by mouth at bedtime.     Marland Kitchen  Multiple Vitamin (MULTIVITAMIN) tablet Take 1 tablet by mouth daily.    . niacin 100 MG tablet Take 250 mg by mouth daily with breakfast.     . Omega-3 Fatty Acids (FISH OIL) 1000 MG CAPS Take 1 capsule by mouth daily.    Marland Kitchen OVER THE COUNTER MEDICATION Place 1 drop into both eyes every morning.    . simvastatin (ZOCOR) 40 MG tablet TAKE 1 TABLET BY MOUTH AT BEDTIME FOR CHOLESTEROL 90 tablet 1  . vitamin E 400 UNIT capsule Take 400 Units by mouth daily.     No current facility-administered medications on file prior to visit.   Allergies  Allergen Reactions  . Celebrex [Celecoxib] Other (See Comments)    Raised B/P   . Codeine Other (See Comments)    Severe stomach pain      ROS: See HPI for  pertinent positives and negatives.  Physical Examination  Filed Vitals:   12/13/15 0944 12/13/15 0946  BP: 113/64 124/76  Pulse: 70 71  Temp:  97.4 F (36.3 C)  TempSrc:  Oral  Resp:  16  Height:  5' 8.5" (1.74 m)  Weight:  204 lb (92.534 kg)  SpO2:  96%   Body mass index is 30.56 kg/(m^2).  General: A&O x 3, WD, obese male.  Pulmonary: Sym exp, good air movt, CTAB, non labored respirations  Cardiac: RRR, Nl S1, S2, no murmur appreciated  Vascular: Vessel Right Left  Radial Palpable Palpable  Brachial Palpable Palpable  Carotid  without bruit  without bruit  Aorta Not palpable N/A  Femoral Palpable Palpable  Popliteal Not palpable Not palpable  PT Palpable Palpable  DP Palpable Palpable   Gastrointestinal: soft, NTND, -G/R, - HSM, - palpable masses, - CVAT B.  Musculoskeletal: M/S 5/5 throughout, extremities without ischemic changes.  Neurologic: Pain and light touch intact in extremities, Motor exam as listed above. CN 2-12 intact except for hard of hearing.   Non-Invasive Vascular Imaging: (12/13/2015):  CEREBROVASCULAR DUPLEX EVALUATION    INDICATION: Carotid artery disease    PREVIOUS INTERVENTION(S):     DUPLEX EXAM: Carotid duplex    RIGHT  LEFT  Peak Systolic Velocities (cm/s) End Diastolic Velocities (cm/s) Plaque LOCATION Peak Systolic Velocities (cm/s) End Diastolic Velocities (cm/s) Plaque  138 13  CCA PROXIMAL 84 15 HT  101 13 HT CCA MID 94 17 HT  108 10 HT CCA DISTAL 88 16 HT  96 5 HT ECA 98 14 HT  78 12 HT ICA PROXIMAL 202 46 HT  71 18  ICA MID 234 55 curve  58 17  ICA DISTAL 66 15     .77 ICA / CCA Ratio (PSV) 2.1  Antegrade Vertebral Flow Antegrade  - Brachial Systolic Pressure (mmHg) -  Triphasic Brachial Artery Waveforms Triphasic    Plaque Morphology:  HM = Homogeneous, HT = Heterogeneous, CP = Calcific Plaque, SP = Smooth Plaque, IP = Irregular Plaque     ADDITIONAL FINDINGS: Multiphasic subclavian arteries    IMPRESSION:  1. Less than 40% right internal carotid artery stenosis 2. 40 - 59% left internal carotid artery stenosis    Compared to the previous exam:  No change since exam previous exam     EVAR Duplex (Date: 12/13/2015) ABDOMINAL AORTA DUPLEX EVALUATION - POST ENDOVASCULAR REPAIR    INDICATION: Follow up abdominal aortic surgery    PREVIOUS INTERVENTION(S): EVAR 10/29/2013; History of endoleak    DUPLEX EXAM:      DIAMETER AP (cm) DIAMETER TRANSVERSE (cm) VELOCITIES (  cm/sec)  Aorta 5.4 5.5 77  Right Common Iliac 1.9 1.9 108  Left Common Iliac 1.5 1.6 79    Comparison Study       Date DIAMETER AP (cm) DIAMETER TRANSVERSE (cm)  09/10/2013 [facility] 5.3 -     ADDITIONAL FINDINGS:     IMPRESSION: 1. Technically difficult exam due to bowel gas 2. 5.4 x 5.5 cm sac size    Compared to the previous exam:  No recent exam     CTA Abd/Pelvis Duplex (Date: 06/09/15) IMPRESSION: The previously seen type 2 endoleak has been resolved after from below therapy. Maximal sac diameter has diminished from 5.9 cm to 5.5 cm. Bilateral common iliac artery aneurysm sacs remained excluded and without endoleak. They are not significantly changed.   Medical Decision Making  Andrew Vega is a 82 y.o. male who presents s/p EVAR (Date:  10/29/2013). He had a type II endoleak which was associated with aneurysm sac expansion. Therefore he underwent embolization in March 2016.   Pt is asymptomatic with stable or decreased sac size compared to CTA of 06/09/15.  He has no hx of stroke or TIA. Today's carotid duplex suggests ss than 40% right internal carotid artery stenosis and 40 - 59% left internal carotid artery stenosis. No change since exam previous exam.    I discussed with the patient the importance of surveillance of the endograft.  The next endograft duplex will be scheduled for 6 months.  Will schedule carotid duplex in a year.  I emphasized the importance of maximal medical management  including strict control of blood pressure, blood glucose, and lipid levels, antiplatelet agents, obtaining regular exercise, and cessation of smoking.   Thank you for allowing [place] to participate in this patient's care.  Clemon Chambers, RN, MSN, FNP-C [facility] [place] Office: [phone]  Clinic Physician: Kellie Simmering  12/13/2015, 10:09 AM

## 2015-12-13 NOTE — Patient Instructions (Signed)
Stroke Prevention Some medical conditions and behaviors are associated with an increased chance of having a stroke. You may prevent a stroke by making healthy choices and managing medical conditions. HOW CAN I REDUCE MY RISK OF HAVING A STROKE?   Stay physically active. Get at least 30 minutes of activity on most or all days.  Do not smoke. It may also be helpful to avoid exposure to secondhand smoke.  Limit alcohol use. Moderate alcohol use is considered to be:  No more than 2 drinks per day for men.  No more than 1 drink per day for nonpregnant women.  Eat healthy foods. This involves:  Eating 5 or more servings of fruits and vegetables a day.  Making dietary changes that address high blood pressure (hypertension), high cholesterol, diabetes, or obesity.  Manage your cholesterol levels.  Making food choices that are high in fiber and low in saturated fat, trans fat, and cholesterol may control cholesterol levels.  Take any prescribed medicines to control cholesterol as directed by your health care provider.  Manage your diabetes.  Controlling your carbohydrate and sugar intake is recommended to manage diabetes.  Take any prescribed medicines to control diabetes as directed by your health care provider.  Control your hypertension.  Making food choices that are low in salt (sodium), saturated fat, trans fat, and cholesterol is recommended to manage hypertension.  Ask your health care provider if you need treatment to lower your blood pressure. Take any prescribed medicines to control hypertension as directed by your health care provider.  If you are 18-39 years of age, have your blood pressure checked every 3-5 years. If you are 40 years of age or older, have your blood pressure checked every year.  Maintain a healthy weight.  Reducing calorie intake and making food choices that are low in sodium, saturated fat, trans fat, and cholesterol are recommended to manage  weight.  Stop drug abuse.  Avoid taking birth control pills.  Talk to your health care provider about the risks of taking birth control pills if you are over 35 years old, smoke, get migraines, or have ever had a blood clot.  Get evaluated for sleep disorders (sleep apnea).  Talk to your health care provider about getting a sleep evaluation if you snore a lot or have excessive sleepiness.  Take medicines only as directed by your health care provider.  For some people, aspirin or blood thinners (anticoagulants) are helpful in reducing the risk of forming abnormal blood clots that can lead to stroke. If you have the irregular heart rhythm of atrial fibrillation, you should be on a blood thinner unless there is a good reason you cannot take them.  Understand all your medicine instructions.  Make sure that other conditions (such as anemia or atherosclerosis) are addressed. SEEK IMMEDIATE MEDICAL CARE IF:   You have sudden weakness or numbness of the face, arm, or leg, especially on one side of the body.  Your face or eyelid droops to one side.  You have sudden confusion.  You have trouble speaking (aphasia) or understanding.  You have sudden trouble seeing in one or both eyes.  You have sudden trouble walking.  You have dizziness.  You have a loss of balance or coordination.  You have a sudden, severe headache with no known cause.  You have new chest pain or an irregular heartbeat. Any of these symptoms may represent a serious problem that is an emergency. Do not wait to see if the symptoms will   go away. Get medical help at once. Call your local emergency services (911 in [place] Do not drive you[place] to the hospital.   This information is not intended to replace advice given to you by your health care provider. Make sure you discuss any questions you have with your health care provider.   Document Released: 11/16/2004 Document Revised: 10/30/2014 Document Reviewed:  04/11/2013 Elsevier Interactive Patient Education 2016 [employer].    For the abdominal ultrasound preparation: 2 Extra Strength Gas-X at bedtime the night before the test, and 2 more Extra-Strength Gas-X about 2-3 hours before the test

## 2015-12-17 ENCOUNTER — Ambulatory Visit (INDEPENDENT_AMBULATORY_CARE_PROVIDER_SITE_OTHER): Payer: Medicare Other | Admitting: Internal Medicine

## 2015-12-17 ENCOUNTER — Encounter: Payer: Self-pay | Admitting: Internal Medicine

## 2015-12-17 VITALS — BP 110/72 | HR 82 | Temp 97.7°F | Ht 69.0 in | Wt 206.0 lb

## 2015-12-17 DIAGNOSIS — M47812 Spondylosis without myelopathy or radiculopathy, cervical region: Secondary | ICD-10-CM | POA: Diagnosis not present

## 2015-12-17 DIAGNOSIS — F329 Major depressive disorder, single episode, unspecified: Secondary | ICD-10-CM | POA: Diagnosis not present

## 2015-12-17 DIAGNOSIS — M6249 Contracture of muscle, multiple sites: Secondary | ICD-10-CM

## 2015-12-17 DIAGNOSIS — M62838 Other muscle spasm: Secondary | ICD-10-CM

## 2015-12-17 DIAGNOSIS — F32A Depression, unspecified: Secondary | ICD-10-CM

## 2015-12-17 DIAGNOSIS — I6523 Occlusion and stenosis of bilateral carotid arteries: Secondary | ICD-10-CM

## 2015-12-17 NOTE — Patient Instructions (Signed)
Use your supportive neck band.  Let me know which therapy your insurance company will cover.  Then we can order therapy for pain control modalities and exercises.  In the mean time, please apply warm compresses for 20 mins 3-4 times per day to the left side of your neck.    Icy hot or theragesic cream can be helpful.

## 2015-12-17 NOTE — Progress Notes (Signed)
Patient ID: Andrew Vega, male   DOB: Dec 23, 1925, 82 y.o.   MRN: [id number]  Provider: Rexene Edison. Mariea Clonts, D.O., C.M.D.  Goals of Care:  Advanced Directives 12/17/2015  Does patient have an advance directive? Yes  Type of Advance Directive [place]  Does patient want to make changes to advanced directive? -  Copy of advanced directive(s) in chart? Yes     Chief Complaint  Patient presents with  . Acute Visit    pain in left neck, in back, limits motion for couple of weeks. Would like referral. It's a condition that flares up.    HPI: Patient is a 82 y.o. male seen today for an acute visit for neck pain.  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  He had taken a medication that helped at the time.  He had a series of exercises that helped.  He is trying to do some exercises on his own.  He's been using tylenol intermittently for several weeks.  Helps, but wonders if there is something more helpful.  No paresthesias, no recent falls or other trauma.  It has not interfered with sleep.  Never sleeps well anyway though.  Uses tylenol only at night.  Feels a little less energy after tylenol.    Depression:  Taking the celexa 40mg .  Does not even notice benefit.  Still taking ativan--only using evening dose not morning dose.  Also taking 6mg  of melatonin at bedtime.  Goes to bed at 10pm, wakes up at 4:30am, but has to get up through the night--cannot stay asleep.  Caregivers offer help.  He is still distressed about his wife--she cannot remember anything and lives for the moment, her mood is good all of the time, she knows that he is important to her and now calls him daddy.    Past Medical History  Diagnosis Date  . Anxiety   . Hyperlipidemia   . Abdominal aneurysm without mention of rupture   . Insomnia, unspecified   . Colon polyps   . Arthritis   . Diverticulosis of colon (without mention of hemorrhage)   . COPD (chronic obstructive pulmonary disease) ([place])   .  Inguinal hernia   . Anginal pain ([place])     recent hospitalization  . H/O hiatal hernia   . Cancer [facility]) Jan. 2017    skin cancer removal/ Right medial lower Leg    Past Surgical History  Procedure Laterality Date  . Vasectomy  1962  . Cholecystectomy  1962  . Colon surgery  2007  . Bilateral shoulder repair    . Nasal septum surgery  2011  . Hernia repair    . Abdominal aortic endovascular stent graft N/A 10/29/2013    Procedure: ABDOMINAL AORTIC ENDOVASCULAR STENT GRAFT;  Surgeon: [name], MD;  Location: [facility] OR;  Service: Vascular;  Laterality: N/A;  . Mohs surgery Right 11/2015    lower leg    Allergies  Allergen Reactions  . Celebrex [Celecoxib] Other (See Comments)    Raised B/P   . Codeine Other (See Comments)    Severe stomach pain       Medication List       This list is accurate as of: 12/17/15  8:54 AM.  Always use your most recent med list.               aspirin 81 MG tablet  Take 81 mg by mouth daily.     CHLOR-TRIMETON 4 MG tablet  Generic drug:  chlorpheniramine  Take 4-8 mg by mouth every 6 (six) hours as needed for allergies.     citalopram 40 MG tablet  Commonly known as:  CELEXA  TAKE 1 TABLET BY MOUTH EVERY DAY.     fexofenadine 180 MG tablet  Commonly known as:  ALLEGRA  Take 1 tablet (180 mg total) by mouth daily.     Fish Oil 1000 MG Caps  Take 1 capsule by mouth daily.     GLUCOSAMINE CHONDR 1500 COMPLX PO  Take 1 capsule by mouth daily.     LORazepam 1 MG tablet  Commonly known as:  ATIVAN  TAKE 1/2 TABLET BY MOUTH EVERY MORNING AND 1 TABLET BY MOUTH EVERY NIGHT AT BEDTIME     Melatonin 3 MG Tabs  Take 2 tablets by mouth at bedtime.     multivitamin tablet  Take 1 tablet by mouth daily.     niacin 100 MG tablet  Take 250 mg by mouth daily with breakfast.     OVER THE COUNTER MEDICATION  Place 1 drop into both eyes every morning.     simvastatin 40 MG tablet  Commonly known as:  ZOCOR  TAKE 1 TABLET BY MOUTH AT  BEDTIME FOR CHOLESTEROL     SUPER B COMPLEX PO  Take 1 tablet by mouth daily.     Vitamin D 2000 units Caps  Take 2,000 Units by mouth daily.     vitamin E 400 UNIT capsule  Take 400 Units by mouth daily.        Review of Systems:  Review of Systems  Constitutional: Negative for activity change and appetite change.  Musculoskeletal: Positive for myalgias, arthralgias, gait problem, neck pain and neck stiffness. Negative for joint swelling.  Psychiatric/Behavioral: Positive for decreased concentration. The patient is nervous/anxious.        Depression    Health Maintenance  Topic Date Due  . ZOSTAVAX  08/07/1988  . INFLUENZA VACCINE  05/23/2016  . TETANUS/TDAP  10/23/2021  . PNA vac Low Risk Adult  Completed    Physical Exam: Filed Vitals:   12/17/15 0834  BP: 110/72  Pulse: 82  Temp: 97.7 F (36.5 C)  TempSrc: Oral  Height: 5\' 9"  (1.753 m)  Weight: 206 lb (93.441 kg)  SpO2: 95%   Body mass index is 30.41 kg/(m^2). Physical Exam  Constitutional: He is oriented to person, place, and time. He appears well-developed and well-nourished.  Musculoskeletal: He exhibits edema and tenderness.  Decreased sidebending and rotation left; tender left paravertebral muscles  Neurological: He is alert and oriented to person, place, and time.  Skin: Skin is warm and dry.  Psychiatric: He has a normal mood and affect.    Labs reviewed: Basic Metabolic Panel:  Recent Labs  01/08/15 1015 06/08/15 1259 06/21/15 0845  NA 139  --  142  K 4.7  --  4.4  CL 108  --  103  CO2 25  --  22  GLUCOSE 100*  --  101*  BUN 15 24* 25  CREATININE 1.50* 1.51* 1.56*  CALCIUM 9.3  --  9.2   Liver Function Tests:  Recent Labs  06/21/15 0845  AST 24  ALT 21  ALKPHOS 60  BILITOT 0.2  PROT 6.7  ALBUMIN 4.0   No results for input(s): LIPASE, AMYLASE in the last 8760 hours. No results for input(s): AMMONIA in the last 8760 hours. CBC:  Recent Labs  01/08/15 1015 06/21/15 0845    WBC  6.3 6.3  NEUTROABS 3.5 3.6  HGB 13.7  --   HCT 39.1 38.3  MCV 89.3 93  PLT 164 162   Lipid Panel:  Recent Labs  06/21/15 0845  CHOL 156  HDL 47  LDLCALC 87  TRIG 112  CHOLHDL 3.3   Assessment/Plan 1. Cervical paraspinal muscle spasm -advised to use warm compresses and topical agents (at different times)  -recommended PT for his neck for pain modalities and exercises--he is to call and let me know if he wants it through [facility] or at [facility]  2. Cervical osteoarthritis -causing #1  3. Depression -stable, he reports, wants to continue his celexa and lorazepam at this point -seems more alert and calmer today to me  Labs/tests ordered:  No new--will order PT but need to know which location  Next appt:  01/24/2016  Mohamud Mrozek L. Reyna Lorenzi, D.O. [facility] [address], [place] [address] Cell Phone (Mon-Fri 8am-5pm):  [phone] On Call:  [phone] & follow prompts after 5pm & weekends Office Phone:  [phone] Office Fax:  [phone]

## 2015-12-19 ENCOUNTER — Other Ambulatory Visit: Payer: Self-pay | Admitting: Internal Medicine

## 2015-12-29 ENCOUNTER — Encounter: Payer: Self-pay | Admitting: Podiatry

## 2015-12-29 ENCOUNTER — Ambulatory Visit (INDEPENDENT_AMBULATORY_CARE_PROVIDER_SITE_OTHER): Payer: Medicare Other | Admitting: Podiatry

## 2015-12-29 DIAGNOSIS — Z85828 Personal history of other malignant neoplasm of skin: Secondary | ICD-10-CM | POA: Diagnosis not present

## 2015-12-29 DIAGNOSIS — M79676 Pain in unspecified toe(s): Secondary | ICD-10-CM

## 2015-12-29 DIAGNOSIS — M79675 Pain in left toe(s): Secondary | ICD-10-CM | POA: Diagnosis not present

## 2015-12-29 DIAGNOSIS — L570 Actinic keratosis: Secondary | ICD-10-CM | POA: Diagnosis not present

## 2015-12-29 DIAGNOSIS — M79674 Pain in right toe(s): Secondary | ICD-10-CM

## 2015-12-29 DIAGNOSIS — B351 Tinea unguium: Secondary | ICD-10-CM

## 2015-12-29 DIAGNOSIS — L821 Other seborrheic keratosis: Secondary | ICD-10-CM | POA: Diagnosis not present

## 2015-12-29 NOTE — Progress Notes (Signed)
Patient ID: Andrew Vega, male   DOB: June 26, 1926, 82 y.o.   MRN: [id number] Complaint:  Visit Type: Patient returns to my office for continued preventative foot care services. Complaint: Patient states" my nails have grown long and thick and become painful to walk and wear shoes". He presents for preventative foot care services. No changes to ROS  Podiatric Exam: Vascular: dorsalis pedis and posterior tibial pulses are palpable bilateral. Capillary return is immediate. Temperature gradient is WNL. Skin turgor WNL  Sensorium: Normal Semmes Weinstein monofilament test. Normal tactile sensation bilaterally. Nail Exam: Pt has thick disfigured discolored nails with subungual debris noted bilateral entire nail hallux  Ulcer Exam: There is no evidence of ulcer or pre-ulcerative changes or infection. Orthopedic Exam: Muscle tone and strength are WNL. No limitations in general ROM. No crepitus or effusions noted. Foot type and digits show no abnormalities. Bony prominences are unremarkable. Skin: No Porokeratosis. No infection or ulcers.  Heloma durum fifth toe right foot  Diagnosis:  Tinea unguium, Pain in right toe, pain in left toes  Treatment & Plan Procedures and Treatment: Consent by patient was obtained for treatment procedures. The patient understood the discussion of treatment and procedures well. All questions were answered thoroughly reviewed. Debridement of mycotic and hypertrophic toenails, 1 through 5 bilateral and clearing of subungual debris. No ulceration, no infection noted.  Return Visit-Office Procedure: Patient instructed to return to the office for a follow up visit 3 months for continued evaluation and treatment.  Gardiner Barefoot DPM

## 2016-01-05 ENCOUNTER — Ambulatory Visit: Payer: Medicare Other | Admitting: Podiatry

## 2016-01-06 ENCOUNTER — Ambulatory Visit (INDEPENDENT_AMBULATORY_CARE_PROVIDER_SITE_OTHER): Payer: Medicare Other | Admitting: Nurse Practitioner

## 2016-01-06 ENCOUNTER — Encounter: Payer: Self-pay | Admitting: Nurse Practitioner

## 2016-01-06 VITALS — BP 122/64 | Temp 97.3°F | Ht 68.0 in | Wt 211.0 lb

## 2016-01-06 DIAGNOSIS — R05 Cough: Secondary | ICD-10-CM | POA: Diagnosis not present

## 2016-01-06 DIAGNOSIS — J309 Allergic rhinitis, unspecified: Secondary | ICD-10-CM

## 2016-01-06 DIAGNOSIS — F411 Generalized anxiety disorder: Secondary | ICD-10-CM

## 2016-01-06 DIAGNOSIS — I6523 Occlusion and stenosis of bilateral carotid arteries: Secondary | ICD-10-CM | POA: Diagnosis not present

## 2016-01-06 DIAGNOSIS — R053 Chronic cough: Secondary | ICD-10-CM

## 2016-01-06 MED ORDER — AMOXICILLIN-POT CLAVULANATE 875-125 MG PO TABS: 1.0000 | ORAL_TABLET | Freq: Two times a day (BID) | ORAL | Status: AC

## 2016-01-06 NOTE — Assessment & Plan Note (Signed)
Takes Allegra 180mg  daily

## 2016-01-06 NOTE — Assessment & Plan Note (Signed)
Mood is stable, continue Celexa 40mg , Lorazepam 0.5mg  bid,

## 2016-01-06 NOTE — Assessment & Plan Note (Signed)
Worsened chronic cough, PPI daily x 2weeks,  non productive, afebrile, denied chest pain, no O2 desat, Augmentin 875mg  bid x 7 days. Observe, may CXR if no better.

## 2016-01-06 NOTE — Progress Notes (Signed)
Patient ID: Andrew Vega, male   DOB: 1926/04/08, 82 y.o.   MRN: [id number]   Location:   [place]   Place of Service:   [facility] clinic Provider: Marlana Latus NP  Code Status: DNR Goals of Care:  Advanced Directives 12/17/2015  Does patient have an advance directive? Yes  Type of Advance Directive [place]  Does patient want to make changes to advanced directive? -  Copy of advanced directive(s) in chart? Yes     Chief Complaint  Patient presents with  . Cough    pt states cough is deep and started about a week ago sometimes its productive    HPI: Patient is a 82 y.o. male seen today for an acute visit for cough x 1 week, no productive, denied chest pain or O2 desat, he is afebrile. Denied palpitation.    Hx of anxiety, takes Celexa and Ativan, mood is stable.   Past Medical History  Diagnosis Date  . Anxiety   . Hyperlipidemia   . Abdominal aneurysm without mention of rupture   . Insomnia, unspecified   . Colon polyps   . Arthritis   . Diverticulosis of colon (without mention of hemorrhage)   . COPD (chronic obstructive pulmonary disease) ([place])   . Inguinal hernia   . Anginal pain ([place])     recent hospitalization  . H/O hiatal hernia   . Cancer [facility]) Jan. 2017    skin cancer removal/ Right medial lower Leg    Past Surgical History  Procedure Laterality Date  . Vasectomy  1962  . Cholecystectomy  1962  . Colon surgery  2007  . Bilateral shoulder repair    . Nasal septum surgery  2011  . Hernia repair    . Abdominal aortic endovascular stent graft N/A 10/29/2013    Procedure: ABDOMINAL AORTIC ENDOVASCULAR STENT GRAFT;  Surgeon: [name], MD;  Location: [facility] OR;  Service: Vascular;  Laterality: N/A;  . Mohs surgery Right 11/2015    lower leg    Allergies  Allergen Reactions  . Celebrex [Celecoxib] Other (See Comments)    Raised B/P   . Codeine Other (See Comments)    Severe stomach pain       Medication List       This list is accurate  as of: 01/06/16 12:26 PM.  Always use your most recent med list.               amoxicillin-clavulanate 875-125 MG tablet  Commonly known as:  AUGMENTIN  Take 1 tablet by mouth 2 (two) times daily.     aspirin 81 MG tablet  Take 81 mg by mouth daily.     CHLOR-TRIMETON 4 MG tablet  Generic drug:  chlorpheniramine  Take 4-8 mg by mouth every 6 (six) hours as needed for allergies.     citalopram 40 MG tablet  Commonly known as:  CELEXA  TAKE 1 TABLET BY MOUTH EVERY DAY.     fexofenadine 180 MG tablet  Commonly known as:  ALLEGRA  Take 1 tablet (180 mg total) by mouth daily.     Fish Oil 1000 MG Caps  Take 1 capsule by mouth daily.     GLUCOSAMINE CHONDR 1500 COMPLX PO  Take 1 capsule by mouth daily.     LORazepam 1 MG tablet  Commonly known as:  ATIVAN  TAKE 0.5 TABLET BY MOUTH EVERY MORNING THEN TAKE 1 TABLET BY MOUTH EVERY NIGHT AT BEDTIME     Melatonin  3 MG Tabs  Take 2 tablets by mouth at bedtime.     multivitamin tablet  Take 1 tablet by mouth daily.     niacin 100 MG tablet  Take 250 mg by mouth daily with breakfast.     OVER THE COUNTER MEDICATION  Place 1 drop into both eyes every morning.     simvastatin 40 MG tablet  Commonly known as:  ZOCOR  TAKE 1 TABLET BY MOUTH AT BEDTIME FOR CHOLESTEROL     SUPER B COMPLEX PO  Take 1 tablet by mouth daily.     Vitamin D 2000 units Caps  Take 2,000 Units by mouth daily.     vitamin E 400 UNIT capsule  Take 400 Units by mouth daily.        Review of Systems:  Review of Systems  Constitutional: Negative for fever, chills, activity change and appetite change.  HENT: Positive for hearing loss. Negative for congestion.   Respiratory: Positive for cough. Negative for shortness of breath.   Cardiovascular: Negative for chest pain and leg swelling.  Gastrointestinal: Negative for abdominal pain, constipation and blood in stool.  Genitourinary: Negative for dysuria, urgency and frequency.  Musculoskeletal:  Positive for myalgias, arthralgias, gait problem, neck pain and neck stiffness. Negative for joint swelling.  Skin: Negative for rash.  Allergic/Immunologic: Positive for environmental allergies.  Neurological: Negative for dizziness and weakness.  Psychiatric/Behavioral: Positive for decreased concentration. The patient is nervous/anxious.        Depression    Health Maintenance  Topic Date Due  . ZOSTAVAX  08/07/1988  . INFLUENZA VACCINE  05/23/2016  . TETANUS/TDAP  10/23/2021  . PNA vac Low Risk Adult  Completed    Physical Exam: Filed Vitals:   01/06/16 1141  BP: 122/64  Temp: 97.3 F (36.3 C)  TempSrc: Oral  Height: 5\' 8"  (1.727 m)  Weight: 211 lb (95.709 kg)   Body mass index is 32.09 kg/(m^2). Physical Exam  Constitutional: He is oriented to person, place, and time. He appears well-developed and well-nourished. No distress.  HENT:  Head: Normocephalic and atraumatic.  Right Ear: External ear normal.  Left Ear: External ear normal.  Nose: Nose normal.  Mouth/Throat: Oropharynx is clear and moist. No oropharyngeal exudate.  Eyes: Conjunctivae and EOM are normal. Pupils are equal, round, and reactive to light.  Neck: Normal range of motion. Neck supple.  Cardiovascular: Normal rate and regular rhythm.   Murmur heard. [id number] systolic murmur right sternal border.   Pulmonary/Chest: Effort normal and breath sounds normal.  Abdominal: Soft. Bowel sounds are normal.  Musculoskeletal: He exhibits no edema or tenderness.  Slow gait   Neurological: He is alert and oriented to person, place, and time.  Skin: Skin is warm and dry. He is not diaphoretic.  Psychiatric: He has a normal mood and affect.    Labs reviewed: Basic Metabolic Panel:  Recent Labs  01/08/15 1015 06/08/15 1259 06/21/15 0845  NA 139  --  142  K 4.7  --  4.4  CL 108  --  103  CO2 25  --  22  GLUCOSE 100*  --  101*  BUN 15 24* 25  CREATININE 1.50* 1.51* 1.56*  CALCIUM 9.3  --  9.2   Liver  Function Tests:  Recent Labs  06/21/15 0845  AST 24  ALT 21  ALKPHOS 60  BILITOT 0.2  PROT 6.7  ALBUMIN 4.0   No results for input(s): LIPASE, AMYLASE in the last 8760 hours. No  results for input(s): AMMONIA in the last 8760 hours. CBC:  Recent Labs  01/08/15 1015 06/21/15 0845  WBC 6.3 6.3  NEUTROABS 3.5 3.6  HGB 13.7  --   HCT 39.1 38.3  MCV 89.3 93  PLT 164 162   Lipid Panel:  Recent Labs  06/21/15 0845  CHOL 156  HDL 47  LDLCALC 87  TRIG 112  CHOLHDL 3.3   No results found for: HGBA1C  Procedures since last visit: No results found.  Assessment/Plan Anxiety state Mood is stable, continue Celexa 40mg , Lorazepam 0.5mg  bid,   Allergic rhinitis Takes Allegra 180mg  daily  Chronic cough Worsened chronic cough, PPI daily x 2weeks,  non productive, afebrile, denied chest pain, no O2 desat, Augmentin 875mg  bid x 7 days. Observe, may CXR if no better.      Labs/tests ordered:  @ORDERS @ Next appt:  01/24/2016

## 2016-01-24 ENCOUNTER — Encounter: Payer: Self-pay | Admitting: Internal Medicine

## 2016-01-24 ENCOUNTER — Ambulatory Visit (INDEPENDENT_AMBULATORY_CARE_PROVIDER_SITE_OTHER): Payer: Medicare Other | Admitting: Internal Medicine

## 2016-01-24 VITALS — BP 128/80 | HR 75 | Temp 97.8°F | Resp 12 | Ht 68.0 in | Wt 204.0 lb

## 2016-01-24 DIAGNOSIS — M47812 Spondylosis without myelopathy or radiculopathy, cervical region: Secondary | ICD-10-CM

## 2016-01-24 DIAGNOSIS — M6249 Contracture of muscle, multiple sites: Secondary | ICD-10-CM

## 2016-01-24 DIAGNOSIS — F32A Depression, unspecified: Secondary | ICD-10-CM

## 2016-01-24 DIAGNOSIS — F329 Major depressive disorder, single episode, unspecified: Secondary | ICD-10-CM | POA: Diagnosis not present

## 2016-01-24 DIAGNOSIS — R011 Cardiac murmur, unspecified: Secondary | ICD-10-CM

## 2016-01-24 DIAGNOSIS — R05 Cough: Secondary | ICD-10-CM

## 2016-01-24 DIAGNOSIS — F411 Generalized anxiety disorder: Secondary | ICD-10-CM | POA: Diagnosis not present

## 2016-01-24 DIAGNOSIS — R053 Chronic cough: Secondary | ICD-10-CM

## 2016-01-24 DIAGNOSIS — E785 Hyperlipidemia, unspecified: Secondary | ICD-10-CM | POA: Diagnosis not present

## 2016-01-24 DIAGNOSIS — I6523 Occlusion and stenosis of bilateral carotid arteries: Secondary | ICD-10-CM

## 2016-01-24 DIAGNOSIS — M62838 Other muscle spasm: Secondary | ICD-10-CM

## 2016-01-24 NOTE — Progress Notes (Signed)
Patient ID: Andrew Vega, male   DOB: [birth year], 82 y.o.   MRN: [id number]   Location:  [facility] clinic Provider:  Haili Donofrio L. Mariea Clonts, D.O., C.M.D.  Code Status: full code Goals of Care:  Advanced Directives 01/24/2016  Does patient have an advance directive? Yes  Type of Advance Directive [place]  Does patient want to make changes to advanced directive? No - Patient declined  Copy of advanced directive(s) in chart? Yes   Chief Complaint  Patient presents with  . Medical Management of Chronic Issues    4 month follow-up on cough, hyperlipidemia, and arthritis(neck). MMSE 29/30    HPI: Patient is a 82 y.o. male seen today for medical management of chronic diseases.    MMSE - Mini Mental State Exam 01/24/2016 06/24/2015 05/21/2014  Orientation to time 4 3 4   Orientation to Place 5 5 5   Registration 3 3 3   Attention/ Calculation 5 5 5   Recall 3 3 2   Language- name 2 objects 2 2 2   Language- repeat 1 1 1   Language- follow 3 step command 3 2 3   Language- read & follow direction 1 1 1   Write a sentence 1 1 1   Copy design 1 1 1   Total score 29 27 28   passed clock  Cough he had is just about gone.    We had talked about his neck OA.  I had discussed PT.  He wanted to be sure it was in his network. The [facility] therapy is NOT on his formulary.  He meant to bring the approved list of PTs.  Hard to say if it's better b/c he's avoiding positions off to his left or back.    Nerves:  Progress of his life has not changed much.  Either he's getting used to his current level or depression, but nothing really different.  Has begun reading again, but he thinks he's getting tired of it.  Can finish a book in a week.  Feels like he needs to also be involved in other things.  Has to hurry to restroom if he drinks multiple liquids at meal.  Cannot wait to go up to his room.    Past Medical History  Diagnosis Date  . Anxiety   . Hyperlipidemia   . Abdominal aneurysm without mention  of rupture   . Insomnia, unspecified   . Colon polyps   . Arthritis   . Diverticulosis of colon (without mention of hemorrhage)   . COPD (chronic obstructive pulmonary disease) ([place])   . Inguinal hernia   . Anginal pain ([place])     recent hospitalization  . H/O hiatal hernia   . Cancer [facility]) Jan. 2017    skin cancer removal/ Right medial lower Leg    Past Surgical History  Procedure Laterality Date  . Vasectomy  1962  . Cholecystectomy  1962  . Colon surgery  2007  . Bilateral shoulder repair    . Nasal septum surgery  2011  . Hernia repair    . Abdominal aortic endovascular stent graft N/A 10/29/2013    Procedure: ABDOMINAL AORTIC ENDOVASCULAR STENT GRAFT;  Surgeon: [name], MD;  Location: [facility] OR;  Service: Vascular;  Laterality: N/A;  . Mohs surgery Right 11/2015    lower leg    Allergies  Allergen Reactions  . Celebrex [Celecoxib] Other (See Comments)    Raised B/P   . Codeine Other (See Comments)    Severe stomach pain  Medication List       This list is accurate as of: 01/24/16  3:46 PM.  Always use your most recent med list.               aspirin 81 MG tablet  Take 81 mg by mouth daily.     CHLOR-TRIMETON 4 MG tablet  Generic drug:  chlorpheniramine  Take 4-8 mg by mouth every 6 (six) hours as needed for allergies.     citalopram 40 MG tablet  Commonly known as:  CELEXA  TAKE 1 TABLET BY MOUTH EVERY DAY.     fexofenadine 180 MG tablet  Commonly known as:  ALLEGRA  Take 1 tablet (180 mg total) by mouth daily.     Fish Oil 1000 MG Caps  Take 1 capsule by mouth daily.     GLUCOSAMINE CHONDR 1500 COMPLX PO  Take 1 capsule by mouth daily.     LORazepam 1 MG tablet  Commonly known as:  ATIVAN  TAKE 0.5 TABLET BY MOUTH EVERY MORNING THEN TAKE 1 TABLET BY MOUTH EVERY NIGHT AT BEDTIME     Melatonin 3 MG Tabs  Take 2 tablets by mouth at bedtime.     multivitamin tablet  Take 1 tablet by mouth daily.     niacin 100 MG tablet  Take 250 mg  by mouth daily with breakfast.     OVER THE COUNTER MEDICATION  Place 1 drop into both eyes every morning.     simvastatin 40 MG tablet  Commonly known as:  ZOCOR  TAKE 1 TABLET BY MOUTH AT BEDTIME FOR CHOLESTEROL     SUPER B COMPLEX PO  Take 1 tablet by mouth daily.     Vitamin D 2000 units Caps  Take 2,000 Units by mouth daily.     vitamin E 400 UNIT capsule  Take 400 Units by mouth daily.        Review of Systems:  Review of Systems  Constitutional: Negative for fever and chills.  HENT: Negative for congestion and hearing loss.   Eyes: Negative for blurred vision.  Respiratory: Negative for shortness of breath.   Cardiovascular: Negative for chest pain and leg swelling.  Gastrointestinal: Negative for abdominal pain, constipation, blood in stool and melena.  Genitourinary: Negative for dysuria.  Musculoskeletal: Negative for falls.  Skin: Negative for rash.  Neurological: Negative for dizziness and loss of consciousness.  Psychiatric/Behavioral: Positive for depression and memory loss. The patient is nervous/anxious and has insomnia.     Health Maintenance  Topic Date Due  . ZOSTAVAX  06/23/2016 (Originally 08[birth year]07/1988)  . INFLUENZA VACCINE  05/23/2016  . TETANUS/TDAP  10/23/2021  . PNA vac Low Risk Adult  Completed    Physical Exam: Filed Vitals:   01/24/16 1541  BP: 128/80  Pulse: 75  Temp: 97.8 F (36.6 C)  TempSrc: Oral  Resp: 12  Height: 5\' 8"  (1.727 m)  Weight: 204 lb (92.534 kg)  SpO2: 93%   Body mass index is 31.03 kg/(m^2). Physical Exam  Constitutional: He is oriented to person, place, and time. He appears well-developed and well-nourished. No distress.  Cardiovascular: Normal rate, regular rhythm and intact distal pulses.   Murmur heard. Murmur in left second ICS   Pulmonary/Chest: Effort normal and breath sounds normal. No respiratory distress.  Abdominal: Soft. Bowel sounds are normal. He exhibits no distension. There is no tenderness.    Musculoskeletal: He exhibits tenderness.  Decreased sidebending and rotation left and extension; tenderness of left cervical  paravertebral muscles and trapezius  Neurological: He is alert and oriented to person, place, and time.  Skin: Skin is warm and dry.  Psychiatric: He has a normal mood and affect.    Labs reviewed: Basic Metabolic Panel:  Recent Labs  06/08/15 1259 06/21/15 0845  NA  --  142  K  --  4.4  CL  --  103  CO2  --  22  GLUCOSE  --  101*  BUN 24* 25  CREATININE 1.51* 1.56*  CALCIUM  --  9.2   Liver Function Tests:  Recent Labs  06/21/15 0845  AST 24  ALT 21  ALKPHOS 60  BILITOT 0.2  PROT 6.7  ALBUMIN 4.0   No results for input(s): LIPASE, AMYLASE in the last 8760 hours. No results for input(s): AMMONIA in the last 8760 hours. CBC:  Recent Labs  06/21/15 0845  WBC 6.3  NEUTROABS 3.6  HCT 38.3  MCV 93  PLT 162   Lipid Panel:  Recent Labs  06/21/15 0845  CHOL 156  HDL 47  LDLCALC 87  TRIG 112  CHOLHDL 3.3   Assessment/Plan 1. Cervical osteoarthritis - Ambulatory referral to Physical Therapy - pt would like therapy now for his neck which is not improving  2. Newly recognized heart murmur - I see that Dr. Debara Pickett did actually hear a murmur when he evaluated him before his aortic aneurysm surgery, but no one else has documented this otherwise -I first heard it today and it is at least 2-3/6 in 2nd ICS - Ambulatory referral to Cardiology  3. Cervical paraspinal muscle spasm - see #1 - Ambulatory referral to Physical Therapy  4. Chronic cough -improved, he is not concerned about this todau - CBC with Differential/Platelet; Future  5. Anxiety state -ongoing, due to his wife's late stage dementia -cont benzos as he requires these for his nerves at this point  6. Depression -cont celexa with lorazepam as needed for his anxiety  7. Hyperlipidemia - cont zocor at bedtime - CBC with Differential/Platelet; Future - Comprehensive  metabolic panel; Future - Lipid panel; Future  Labs/tests ordered:   Orders Placed This Encounter  Procedures  . CBC with Differential/Platelet    Standing Status: Future     Number of Occurrences:      Standing Expiration Date: 07/25/2016  . Comprehensive metabolic panel    Standing Status: Future     Number of Occurrences:      Standing Expiration Date: 07/25/2016    Order Specific Question:  Has the patient fasted?    Answer:  Yes  . Lipid panel    Standing Status: Future     Number of Occurrences:      Standing Expiration Date: 07/25/2016    Order Specific Question:  Has the patient fasted?    Answer:  Yes  . Ambulatory referral to Physical Therapy    Referral Priority:  Routine    Referral Type:  Physical Medicine    Referral Reason:  Specialty Services Required    Requested Specialty:  Physical Therapy    Number of Visits Requested:  1  . Ambulatory referral to Cardiology    Referral Priority:  Routine    Referral Type:  Consultation    Referral Reason:  Specialty Services Required    Requested Specialty:  Cardiology    Number of Visits Requested:  1    Next appt:  05/08/2016 with labs before  [place]. Ellie Bryand, D.O. [facility] [address], [place] [address] Cell Phone (Mon-Fri 8am-5pm):  [phone] On Call:  [phone] & follow prompts after 5pm & weekends Office Phone:  [phone] Office Fax:  [phone]

## 2016-01-28 ENCOUNTER — Telehealth: Payer: Self-pay | Admitting: Cardiology

## 2016-01-28 ENCOUNTER — Ambulatory Visit: Payer: Medicare Other | Admitting: Cardiology

## 2016-01-28 ENCOUNTER — Encounter: Payer: Self-pay | Admitting: Cardiology

## 2016-01-28 ENCOUNTER — Ambulatory Visit (INDEPENDENT_AMBULATORY_CARE_PROVIDER_SITE_OTHER): Payer: Medicare Other | Admitting: Cardiology

## 2016-01-28 VITALS — BP 116/58 | HR 81 | Ht 68.0 in | Wt 205.2 lb

## 2016-01-28 DIAGNOSIS — R000 Tachycardia, unspecified: Secondary | ICD-10-CM

## 2016-01-28 DIAGNOSIS — I6523 Occlusion and stenosis of bilateral carotid arteries: Secondary | ICD-10-CM

## 2016-01-28 DIAGNOSIS — I351 Nonrheumatic aortic (valve) insufficiency: Secondary | ICD-10-CM | POA: Diagnosis not present

## 2016-01-28 DIAGNOSIS — E785 Hyperlipidemia, unspecified: Secondary | ICD-10-CM | POA: Diagnosis not present

## 2016-01-28 NOTE — Progress Notes (Signed)
01/28/2016 Alfredo Bach   Sep 10, 1926  [id number]  Primary Physician REED, Homer, DO Primary Cardiologist: Dr. Debara Pickett (Last seen in 2014)  Reason for Visit/CC: Cardiac Murmur  HPI:  82 y/o male followed by Dr. Debara Pickett, but has not bee seen since 2014. He was living in [facility] but moved to [place] several years ago. He was admitted in 2014 for CP evaluation. He ruled out for MI. He had a CT pulmonary angiogram which was negative for PE. Unfortunately the gating of the contrast filled the coronaries so it was difficult to determine whether he has coronary calcium or not.  Echocardiogram was peformed which demonstrated an EF of 60% with normal wall motion, aortic sclerosis and mild aortic insufficiency. He also has a  h/o AAA and underwent abdominal aortic endovascular stent grafting by Dr. Trula Slade in 2015. Prior to undergoing his surgery he underwent a nuclear stress test which was negative for ischemia. He is also a former smoker but quit in 1986. He also has a history of dyslipidemia treated with statin therapy.  He was recently seen by his PCP, Dr. Hollace Kinnier, on 01/24/2016. At that time she documented a newly recognized heart murmur. She placed an ambulatory referral to cardiology.  The patient reports that he has was unaware that he had a cardiac murmur until it was mentioned by Dr. Mariea Clonts. He denies any significant dyspnea. He has mild exertional dyspnea when carrying heavy bags of groceries but no other exertional dyspnea with any other activities that he does on a daily basis. He denies any chest pain, pressure, tightness or heaviness. No dizziness or lightheadedness. No syncope/near syncope. No lower extremity edema, orthopnea or PND. Blood pressure in clinic today is well-controlled. His EKG shows normal sinus rhythm and no ischemic abnormalities.  Current Outpatient Prescriptions  Medication Sig Dispense Refill  . aspirin 81 MG tablet Take 81 mg by mouth daily.    . B Complex-C (SUPER B COMPLEX PO)  Take 1 tablet by mouth daily.     . chlorpheniramine (CHLOR-TRIMETON) 4 MG tablet Take 4-8 mg by mouth every 6 (six) hours as needed for allergies.     . Cholecalciferol (VITAMIN D) 2000 UNITS CAPS Take 2,000 Units by mouth daily.     . citalopram (CELEXA) 40 MG tablet TAKE 1 TABLET BY MOUTH EVERY DAY. 90 tablet 0  . fexofenadine (ALLEGRA) 180 MG tablet Take 1 tablet (180 mg total) by mouth daily. 30 tablet 0  . Glucosamine-Chondroit-Vit C-Mn (GLUCOSAMINE CHONDR 1500 COMPLX PO) Take 1 capsule by mouth daily.    Marland Kitchen LORazepam (ATIVAN) 1 MG tablet TAKE 0.5 TABLET BY MOUTH EVERY MORNING THEN TAKE 1 TABLET BY MOUTH EVERY NIGHT AT BEDTIME 45 tablet 0  . Melatonin 3 MG TABS Take 2 tablets by mouth at bedtime.     . Multiple Vitamin (MULTIVITAMIN) tablet Take 1 tablet by mouth daily.    . niacin 100 MG tablet Take 250 mg by mouth daily with breakfast.     . Omega-3 Fatty Acids (FISH OIL) 1000 MG CAPS Take 1 capsule by mouth daily.    Marland Kitchen OVER THE COUNTER MEDICATION Place 1 drop into both eyes every morning.    . simvastatin (ZOCOR) 40 MG tablet TAKE 1 TABLET BY MOUTH AT BEDTIME FOR CHOLESTEROL 90 tablet 1  . vitamin E 400 UNIT capsule Take 400 Units by mouth daily.     No current facility-administered medications for this visit.    Allergies  Allergen Reactions  . Celebrex [Celecoxib]  Other (See Comments)    Raised B/P   . Codeine Other (See Comments)    Severe stomach pain     Social History   Social History  . Marital Status: Married    Spouse Name: N/A  . Number of Children: N/A  . Years of Education: N/A   Occupational History  . retired    Social History Main Topics  . Smoking status: Former Smoker -- 0.75 packs/day for 25 years    Types: Cigarettes    Quit date: 10/23/1984  . Smokeless tobacco: Never Used  . Alcohol Use: 0.6 oz/week    1 Glasses of wine per week     Comment: social  . Drug Use: No  . Sexual Activity: No   Other Topics Concern  . Not on file   Social  History Narrative     Review of Systems: General: negative for chills, fever, night sweats or weight changes.  Cardiovascular: negative for chest pain, dyspnea on exertion, edema, orthopnea, palpitations, paroxysmal nocturnal dyspnea or shortness of breath Dermatological: negative for rash Respiratory: negative for cough or wheezing Urologic: negative for hematuria Abdominal: negative for nausea, vomiting, diarrhea, bright red blood per rectum, melena, or hematemesis Neurologic: negative for visual changes, syncope, or dizziness All other systems reviewed and are otherwise negative except as noted above.    Blood pressure 116/58, pulse 81, height 5\' 8"  (1.727 m), weight 205 lb 3.2 oz (93.078 kg).  General appearance: alert, cooperative and no distress Neck: no carotid bruit and no JVD Lungs: clear to auscultation bilaterally Heart: regular rate and rhythm, 2/6 systolic and diastolic murmur loudest at the LUSB Extremities: no LEE Pulses: 2+ and symmetric Skin: warm and dry Neurologic: Grossly normal  EKG NSR. No ischemic abnormalities   ASSESSMENT AND PLAN:   1. Cardiac Murmur: 2-D echocardiogram in November 2014 showed trivial aortic insufficiency. EF at that time was normal at 60%. Wall motion was also normal. Grade 1 diastolic dysfunction was noted. There was aortic sclerosis without stenosis. Cardiac exam today reveals the presence of a 2/6 systolic and diastolic murmur, loudest at the left upper sternal border. He is asymptomatic without significant dyspnea, orthopnea, PND, lower extremity edema, dizziness, syncope/near-syncope. No chest pain. His EKG shows normal sinus rhythm and is essentially unchanged compared to previous EKG in 2014. Given it has been 3 years since his last echocardiogram, we will reorder another 2-D echo to reassess the status of his aortic valve and to assess the rest of his cardiac anatomy.   PLAN  follow-up with Dr. Debara Pickett in 6 months. We will have him  follow-up sooner if his echocardiogram reveals any significant abnormalities that would need immediate attention.  Sanaiyah Kirchhoff PA-C 01/28/2016 2:07 PM

## 2016-01-28 NOTE — Patient Instructions (Signed)
Medication Instructions:  Your physician recommends that you continue on your current medications as directed. Please refer to the Current Medication list given to you today.   Labwork: None ordered  Testing/Procedures: Your physician has requested that you have an echocardiogram. Echocardiography is a painless test that uses sound waves to create images of your heart. It provides your doctor with information about the size and shape of your heart and how well your heart's chambers and valves are working. This procedure takes approximately one hour. There are no restrictions for this procedure.    Follow-Up: Your physician wants you to follow-up in: [place] DR. HILTY You will receive a reminder letter in the mail two months in advance. If you don't receive a letter, please call our office to schedule the follow-up appointment.   Any Other Special Instructions Will Be Listed Below (If Applicable).  Echocardiogram An echocardiogram, or echocardiography, uses sound waves (ultrasound) to produce an image of your heart. The echocardiogram is simple, painless, obtained within a short period of time, and offers valuable information to your health care provider. The images from an echocardiogram can provide information such as:  Evidence of coronary artery disease (CAD).  Heart size.  Heart muscle function.  Heart valve function.  Aneurysm detection.  Evidence of a past heart attack.  Fluid buildup around the heart.  Heart muscle thickening.  Assess heart valve function. LET [facility] CARE PROVIDER KNOW ABOUT:  Any allergies you have.  All medicines you are taking, including vitamins, herbs, eye drops, creams, and over-the-counter medicines.  Previous problems you or members of your family have had with the use of anesthetics.  Any blood disorders you have.  Previous surgeries you have had.  Medical conditions you have.  Possibility of pregnancy, if this  applies. BEFORE THE PROCEDURE  No special preparation is needed. Eat and drink normally.  PROCEDURE   In order to produce an image of your heart, gel will be applied to your chest and a wand-like tool (transducer) will be moved over your chest. The gel will help transmit the sound waves from the transducer. The sound waves will harmlessly bounce off your heart to allow the heart images to be captured in real-time motion. These images will then be recorded.  You may need an IV to receive a medicine that improves the quality of the pictures. AFTER THE PROCEDURE You may return to your normal schedule including diet, activities, and medicines, unless your health care provider tells you otherwise.   This information is not intended to replace advice given to you by your health care provider. Make sure you discuss any questions you have with your health care provider.   Document Released: 10/06/2000 Document Revised: 10/30/2014 Document Reviewed: 06/16/2013 Elsevier Interactive Patient Education [employer].    If you need a refill on your cardiac medications before your next appointment, please call your pharmacy.

## 2016-01-31 NOTE — Telephone Encounter (Signed)
Close encounter 

## 2016-02-03 ENCOUNTER — Other Ambulatory Visit: Payer: Self-pay | Admitting: Internal Medicine

## 2016-02-04 ENCOUNTER — Other Ambulatory Visit: Payer: Self-pay | Admitting: Internal Medicine

## 2016-02-07 ENCOUNTER — Ambulatory Visit (HOSPITAL_COMMUNITY): Payer: Medicare Other | Attending: Cardiology

## 2016-02-07 ENCOUNTER — Ambulatory Visit: Payer: Medicare Other | Admitting: Internal Medicine

## 2016-02-07 ENCOUNTER — Other Ambulatory Visit: Payer: Self-pay

## 2016-02-07 DIAGNOSIS — I517 Cardiomegaly: Secondary | ICD-10-CM | POA: Diagnosis not present

## 2016-02-07 DIAGNOSIS — Z87891 Personal history of nicotine dependence: Secondary | ICD-10-CM | POA: Diagnosis not present

## 2016-02-07 DIAGNOSIS — I351 Nonrheumatic aortic (valve) insufficiency: Secondary | ICD-10-CM

## 2016-02-07 DIAGNOSIS — I352 Nonrheumatic aortic (valve) stenosis with insufficiency: Secondary | ICD-10-CM | POA: Insufficient documentation

## 2016-02-07 DIAGNOSIS — I77810 Thoracic aortic ectasia: Secondary | ICD-10-CM | POA: Insufficient documentation

## 2016-02-08 ENCOUNTER — Ambulatory Visit: Payer: Medicare Other | Attending: Internal Medicine | Admitting: Physical Therapy

## 2016-02-08 ENCOUNTER — Encounter: Payer: Self-pay | Admitting: Physical Therapy

## 2016-02-08 DIAGNOSIS — M542 Cervicalgia: Secondary | ICD-10-CM | POA: Diagnosis not present

## 2016-02-08 DIAGNOSIS — R293 Abnormal posture: Secondary | ICD-10-CM

## 2016-02-08 DIAGNOSIS — M6281 Muscle weakness (generalized): Secondary | ICD-10-CM | POA: Diagnosis not present

## 2016-02-08 NOTE — Therapy (Signed)
[place], [place], [address] Phone: [phone]   Fax:  [phone]  Physical Therapy Evaluation  Patient Details  Name: Andrew Vega MRN: [id number] Date of Birth: January 21, 1926 Referring Provider: Gayland Curry  Encounter Date: 02/08/2016      PT End of Session - 02/08/16 1407    Visit Number 1   Number of Visits 13   Date for PT Re-Evaluation 03/21/16   Authorization Type Medicare, KX at visit 15   PT Start Time 1320   PT Stop Time 1404   PT Time Calculation (min) 44 min   Activity Tolerance Patient tolerated treatment well   Behavior During Therapy [facility] for tasks assessed/performed      Past Medical History  Diagnosis Date  . Anxiety   . Hyperlipidemia   . Abdominal aneurysm without mention of rupture   . Insomnia, unspecified   . Colon polyps   . Arthritis   . Diverticulosis of colon (without mention of hemorrhage)   . COPD (chronic obstructive pulmonary disease) ([place])   . Inguinal hernia   . Anginal pain ([place])     recent hospitalization  . H/O hiatal hernia   . Cancer [facility]) Jan. 2017    skin cancer removal/ Right medial lower Leg    Past Surgical History  Procedure Laterality Date  . Vasectomy  1962  . Cholecystectomy  1962  . Colon surgery  2007  . Bilateral shoulder repair    . Nasal septum surgery  2011  . Hernia repair    . Abdominal aortic endovascular stent graft N/A 10/29/2013    Procedure: ABDOMINAL AORTIC ENDOVASCULAR STENT GRAFT;  Surgeon: [name], MD;  Location: [facility] OR;  Service: Vascular;  Laterality: N/A;  . Mohs surgery Right 11/2015    lower leg    There were no vitals filed for this visit.       Subjective Assessment - 02/08/16 1320    Subjective Recurrence of neck pain in  the past, diagnosis of arthritis. Within the last few months pain has increased. Pain with L rotation.    Limitations House hold activities   Currently in Pain? Yes   Pain Score 6     Pain Orientation Left   Pain Descriptors / Indicators Sharp;Discomfort   Pain Type Chronic pain   Pain Radiating Towards deneis radicular pain   Pain Onset More than a month ago   Pain Frequency Constant   Aggravating Factors  turning to look Left   Pain Relieving Factors avoiding rotation, tylenol at night            [facility] PT Assessment - 02/08/16 0001    Assessment   Medical Diagnosis cervical OA   Referring Provider Tiffany L Reed   Onset Date/Surgical Date --  insidious onset over time   Hand Dominance Left   Next MD Visit --  in a couple of weeks   Prior Therapy --  none   Precautions   Precautions None   Restrictions   Weight Bearing Restrictions No   Balance Screen   Has the patient fallen in the past 6 months Yes   How many times? 1 or 2   Has the patient had a decrease in activity level because of a fear of falling?  No   Is the patient reluctant to leave their home because of a fear of falling?  No   Home Environment   Living Environment Assisted living   Prior  Function   Level of Independence Independent   Cognition   Overall Cognitive Status Within Functional Limits for tasks assessed   Observation/Other Assessments   Focus on Therapeutic Outcomes (FOTO)  54   ROM / Strength   AROM / PROM / Strength AROM;Strength;PROM   AROM   AROM Assessment Site Cervical   Cervical Flexion 50   Cervical Extension 30  pain   Cervical - Right Side Bend 30   Cervical - Left Side Bend 12 end range pain   Cervical - Right Rotation 79   Cervical - Left Rotation 51  end range pain   PROM   PROM Assessment Site Cervical   Cervical - Left Rotation 32   Strength   Strength Assessment Site Shoulder   Right/Left Shoulder Right;Left   Right Shoulder External Rotation 3+/5   Left Shoulder External Rotation 3+/5                   [facility] Adult PT Treatment/Exercise - 02/08/16 0001    Exercises   Exercises Shoulder;Neck   Neck Exercises: Theraband    Scapula Retraction 20 reps   Shoulder External Rotation 15 reps   Shoulder External Rotation Limitations yellow TB   Neck Exercises: Supine   Neck Retraction 15 reps;3 secs   Shoulder Exercises: Supine   Protraction PROM   Manual Therapy   Manual therapy comments manual traction, PROM, STM                PT Education - 02/08/16 1407    Education provided Yes   Education Details anatomy of condition, plan of care, HEP, effects of posture on cervical motion   Person(s) Educated Patient   Methods Explanation;Demonstration;Tactile cues;Verbal cues;Handout   Comprehension Verbalized understanding;Returned demonstration;Verbal cues required;Tactile cues required;Need further instruction          PT Short Term Goals - 02/08/16 1414    PT SHORT TERM GOAL #1   Title Patient will demonstrate appropriate posture and form independently with HEP as it is developed. by 02/29/16   Time 3   Period Weeks   Status New   PT SHORT TERM GOAL #2   Title Decreased pain to average of 4/10 by 02/29/16   Time 3   Period Weeks   Status New           PT Long Term Goals - 02/08/16 1416    PT LONG TERM GOAL #1   Title Left cervical rotation to 60 deg actively without end range pain by 5/30   Time 6   Period Weeks   Status New   PT LONG TERM GOAL #2   Title Left cervical sidebend to 20 deg without end range pain by 5/30   Time 6   Period Weeks   Status New   PT LONG TERM GOAL #3   Title Bilateral shoulder external rotation MMT 4+/5 by 5/30   PT LONG TERM GOAL #4   Title average pain <2/10 by 5/30   Time 6   Period Weeks   Status New   PT LONG TERM GOAL #5   Title Pt will verbalize improved awareness of posture to avoid painful movements.    Time 6   Period Weeks   Status New   Additional Long Term Goals   Additional Long Term Goals Yes   PT LONG TERM GOAL #6   Title FOTO to 61 by 5/30   Baseline 54 at evaluation   Time 6  Period Weeks   Status New                Plan - 02/08/16 1408    Clinical Impression Statement Pt presents today with poor posture resulting in inappropriate anatomical alignment of cervical spine and pain with end range sidebend and rotation to the L. MMT are good but pt reported fatigue when asked to hold upright posture and to perform multiple repetitions of exercise. Pt will benefit from skilled PT in order to improve postural awareness, strength and endurance in order to improve cervical alignment and increase ROM with anatomical changes due to arthritis.    Rehab Potential Good   PT Frequency 2x / week   PT Duration 6 weeks   PT Treatment/Interventions ADLs/Self Care Home Management;Cryotherapy;Moist Heat;Therapeutic exercise;Therapeutic activities;Functional mobility training;Traction;Neuromuscular re-education;Patient/family education;Manual techniques;Passive range of motion   PT Next Visit Plan review HEP, rows, triceps pulls, horizontal ABD, cervical rotation ball on wall.    PT Home Exercise Plan Mcconnel ER yellowTB, scapular retractions   Consulted and Agree with Plan of Care Patient      Patient will benefit from skilled therapeutic intervention in order to improve the following deficits and impairments:  Decreased range of motion, Pain, Improper body mechanics, Decreased mobility, Decreased strength, Postural dysfunction  Visit Diagnosis: Cervicalgia - Plan: PT plan of care cert/re-cert  Muscle weakness (generalized) - Plan: PT plan of care cert/re-cert  Abnormal posture - Plan: PT plan of care cert/re-cert      G-Codes - [id number]    Functional Assessment Tool Used FOTO(eval 54, goal 61= 88% ability), clinical judgement   Functional Limitation Other PT primary   Other PT Primary Current Status [id number]) At least 20 percent but less than 40 percent impaired, limited or restricted   Other PT Primary Goal Status [id number]) At least 1 percent but less than 20 percent impaired, limited or restricted        Problem List Patient Active Problem List   Diagnosis Date Noted  . Endoleak post (EVAR) endovascular aneurysm repair ([place]) 01/08/2015  . Abdominal aortic aneurysm ([place])   . Occlusion and stenosis of carotid artery without mention of cerebral infarction 06/01/2014  . Fall in elderly patient 03/30/2014  . Loose stools 03/30/2014  . Closed fracture of proximal phalanx of right hand 03/30/2014  . Multiple fractures of ribs of left side 03/30/2014  . Chronic cough 01/13/2014  . Abdominal aneurysm without mention of rupture 12/01/2013  . Anxiety state 11/19/2013  . Hyperlipidemia 09/03/2013  . Insomnia 03/20/2013  . Allergic rhinitis 03/20/2013   Please feel free to contact with questions, comments or concerns. Thank you for your referral.   Gay Filler. Zamaya Rapaport PT, DPT 02/08/2016 2:28 PM   [place] [facility] [address] [place], [place], [address] Phone: [phone]   Fax:  [phone]  Name: Andrew Vega MRN: [id number] Date of Birth: 04-13-1926

## 2016-02-11 ENCOUNTER — Telehealth: Payer: Self-pay | Admitting: Internal Medicine

## 2016-02-11 NOTE — Telephone Encounter (Signed)
Left msg w/ results as requested, and advised to call if questions.

## 2016-02-11 NOTE — Telephone Encounter (Signed)
New message      Returning a call to get echo results.  OK to leave msg on vm

## 2016-02-14 ENCOUNTER — Telehealth: Payer: Self-pay | Admitting: Internal Medicine

## 2016-02-14 DIAGNOSIS — H6123 Impacted cerumen, bilateral: Secondary | ICD-10-CM | POA: Diagnosis not present

## 2016-02-14 NOTE — Telephone Encounter (Signed)
New message      Calling to get echo results.  A message was left on his vm but it was a bad connection and all he got was something about 6 months.  Please call

## 2016-02-14 NOTE — Telephone Encounter (Signed)
Spoke with pt and he states that he was left a message last week about his echo results but he couldn't understand them. Reviewed echo results with pt. Pt verbalized understanding and was appreciative for call back.

## 2016-02-23 ENCOUNTER — Encounter: Payer: Self-pay | Admitting: Physical Therapy

## 2016-02-23 ENCOUNTER — Ambulatory Visit: Payer: Medicare Other | Attending: Internal Medicine | Admitting: Physical Therapy

## 2016-02-23 DIAGNOSIS — R293 Abnormal posture: Secondary | ICD-10-CM | POA: Insufficient documentation

## 2016-02-23 DIAGNOSIS — M6281 Muscle weakness (generalized): Secondary | ICD-10-CM | POA: Insufficient documentation

## 2016-02-23 DIAGNOSIS — M542 Cervicalgia: Secondary | ICD-10-CM | POA: Diagnosis not present

## 2016-02-23 NOTE — Therapy (Signed)
[place], [place], [address] Phone: [phone]   Fax:  [phone]  Physical Therapy Treatment  Patient Details  Name: Andrew Vega MRN: [id number] Date of Birth: [birth year] Referring Provider: Gayland Vega  Encounter Date: 02/23/2016      PT End of Session - 02/23/16 0954    Visit Number 2   Number of Visits 13   Date for PT Re-Evaluation 03/21/16   Authorization Type Medicare, KX at visit 15   PT Start Time 0945  pt arrived late   PT Stop Time 1023   PT Time Calculation (min) 38 min   Activity Tolerance Patient tolerated treatment well   Behavior During Therapy [facility] for tasks assessed/performed      Past Medical History  Diagnosis Date  . Anxiety   . Hyperlipidemia   . Abdominal aneurysm without mention of rupture   . Insomnia, unspecified   . Colon polyps   . Arthritis   . Diverticulosis of colon (without mention of hemorrhage)   . COPD (chronic obstructive pulmonary disease) ([place])   . Inguinal hernia   . Anginal pain ([place])     recent hospitalization  . H/O hiatal hernia   . Cancer [facility]) Jan. 2017    skin cancer removal/ Right medial lower Leg    Past Surgical History  Procedure Laterality Date  . Vasectomy  1962  . Cholecystectomy  1962  . Colon surgery  2007  . Bilateral shoulder repair    . Nasal septum surgery  2011  . Hernia repair    . Abdominal aortic endovascular stent graft N/A 10/29/2013    Procedure: ABDOMINAL AORTIC ENDOVASCULAR STENT GRAFT;  Surgeon: Andrew Mitchell, MD;  Location: [facility] OR;  Service: Vascular;  Laterality: N/A;  . Mohs surgery Right 11/2015    lower leg    There were no vitals filed for this visit.      Subjective Assessment - 02/23/16 0948    Subjective Pt reports not doing his exercises as regularly as he should at home. reports lack of time during the day.    Currently in Pain? Yes   Pain Score 6   better than it was   Pain Location Neck   Pain Descriptors / Indicators Discomfort;Andrew Vega Adult PT Treatment/Exercise - 02/23/16 0001    Neck Exercises: Machines for Strengthening   UBE (Upper Arm Bike) 5 minutes retro only   Neck Exercises: Seated   Cervical Isometrics Left rotation;Right rotation;10 reps   Shoulder Exercises: Supine   Horizontal ABduction Other (comment)  30    Theraband Level (Shoulder Horizontal ABduction) Level 1 (Yellow)   Flexion 20 reps   Flexion Limitations overhead stretch with wand   Shoulder Exercises: Standing   External Rotation Both;20 reps  mcconnel ER   Theraband Level (Shoulder External Rotation) Level 1 (Yellow)   Row Other (comment)  30   Theraband Level (Shoulder Row) Level 2 (Red)   Modalities   Modalities Moist Heat   Moist Heat Therapy   Number Minutes Moist Heat 10 Minutes   Moist Heat Location Cervical                PT Education - 02/23/16 0953    Education provided Yes   Education Details exercise with PT and progression of POC.    Person(s) Educated  Patient   Methods Explanation;Demonstration   Comprehension Verbalized understanding;Returned demonstration;Verbal cues required;Tactile cues required;Need further instruction          PT Short Term Goals - 02/08/16 1414    PT SHORT TERM GOAL #1   Title Patient will demonstrate appropriate posture and form independently with HEP as it is developed. by 02/29/16   Time 3   Period Weeks   Status New   PT SHORT TERM GOAL #2   Title Decreased pain to average of 4/10 by 02/29/16   Time 3   Period Weeks   Status New           PT Long Term Goals - 02/08/16 1416    PT LONG TERM GOAL #1   Title Left cervical rotation to 60 deg actively without end range pain by 5/30   Time 6   Period Weeks   Status New   PT LONG TERM GOAL #2   Title Left cervical sidebend to 20 deg without end range pain by 5/30   Time 6   Period Weeks   Status New   PT LONG TERM GOAL #3    Title Bilateral shoulder external rotation MMT 4+/5 by 5/30   PT LONG TERM GOAL #4   Title average pain <2/10 by 5/30   Time 6   Period Weeks   Status New   PT LONG TERM GOAL #5   Title Pt will verbalize improved awareness of posture to avoid painful movements.    Time 6   Period Weeks   Status New   Additional Long Term Goals   Additional Long Term Goals Yes   PT LONG TERM GOAL #6   Title FOTO to 61 by 5/30   Baseline 54 at evaluation   Time 6   Period Weeks   Status New               Plan - 02/23/16 1015    Clinical Impression Statement Pt fatigues quickly with exercises and requires cuing for appropriate posture while exercising. Pt was educated on PT and incoorporation of exercise with each treatment.    PT Next Visit Plan review HEP, rows, triceps pulls, cervical rotation ball on wall.    PT Home Exercise Plan Mcconnel ER yellowTB, scapular retractions   Consulted and Agree with Plan of Care Patient      Patient will benefit from skilled therapeutic intervention in order to improve the following deficits and impairments:  Decreased range of motion, Pain, Improper body mechanics, Decreased mobility, Decreased strength, Postural dysfunction  Visit Diagnosis: Cervicalgia  Muscle weakness (generalized)  Abnormal posture     Problem List Patient Active Problem List   Diagnosis Date Noted  . Endoleak post (EVAR) endovascular aneurysm repair ([place]) 01/08/2015  . Abdominal aortic aneurysm ([place])   . Occlusion and stenosis of carotid artery without mention of cerebral infarction 06/01/2014  . Fall in elderly patient 03/30/2014  . Loose stools 03/30/2014  . Closed fracture of proximal phalanx of right hand 03/30/2014  . Multiple fractures of ribs of left side 03/30/2014  . Chronic cough 01/13/2014  . Abdominal aneurysm without mention of rupture 12/01/2013  . Anxiety state 11/19/2013  . Hyperlipidemia 09/03/2013  . Insomnia 03/20/2013  . Allergic rhinitis  03/20/2013    Andrew Vega C. Andrew Vega PT, DPT 02/23/2016 10:17 AM   [facility] [address] [place], [place], [address] Phone: [phone]   Fax:  [phone]  Name: Andrew Vega MRN:  [id number] Date of Birth: Aug 03, 1926

## 2016-02-24 ENCOUNTER — Other Ambulatory Visit: Payer: Self-pay | Admitting: Internal Medicine

## 2016-02-29 ENCOUNTER — Ambulatory Visit: Payer: Medicare Other | Admitting: Physical Therapy

## 2016-02-29 DIAGNOSIS — M542 Cervicalgia: Secondary | ICD-10-CM

## 2016-02-29 DIAGNOSIS — M6281 Muscle weakness (generalized): Secondary | ICD-10-CM

## 2016-02-29 DIAGNOSIS — R293 Abnormal posture: Secondary | ICD-10-CM

## 2016-02-29 NOTE — Therapy (Signed)
Andrew Vega, [place], [address] Phone: [phone]   Fax:  [phone]  Physical Therapy Treatment  Patient Details  Name: Andrew Vega MRN: [id number] Date of Birth: 12-Jun-1926 Referring Provider: Gayland Curry  Encounter Date: 02/29/2016      PT End of Session - 02/29/16 1333    Visit Number 3   Number of Visits 13   Date for PT Re-Evaluation 03/21/16   Authorization Type Medicare, KX at visit 15   PT Start Time 0129   PT Stop Time 0230   PT Time Calculation (min) 61 min      Past Medical History  Diagnosis Date  . Anxiety   . Hyperlipidemia   . Abdominal aneurysm without mention of rupture   . Insomnia, unspecified   . Colon polyps   . Arthritis   . Diverticulosis of colon (without mention of hemorrhage)   . COPD (chronic obstructive pulmonary disease) ([place])   . Inguinal hernia   . Anginal pain ([place])     recent hospitalization  . H/O hiatal hernia   . Cancer [facility]) Jan. 2017    skin cancer removal/ Right medial lower Leg    Past Surgical History  Procedure Laterality Date  . Vasectomy  1962  . Cholecystectomy  1962  . Colon surgery  2007  . Bilateral shoulder repair    . Nasal septum surgery  2011  . Hernia repair    . Abdominal aortic endovascular stent graft N/A 10/29/2013    Procedure: ABDOMINAL AORTIC ENDOVASCULAR STENT GRAFT;  Surgeon: [name], MD;  Location: [facility] OR;  Service: Vascular;  Laterality: N/A;  . Mohs surgery Right 11/2015    lower leg    There were no vitals filed for this visit.      Subjective Assessment - 02/29/16 1332    Subjective I have improved my neck movement. I am doing 40% of my HEP.    Currently in Pain? Yes   Pain Score 4    Pain Location Neck   Pain Orientation Left   Aggravating Factors  turning too the left   Pain Relieving Factors avoiding rotations            [facility] PT Assessment - 02/29/16 0001    AROM   Cervical - Right Side  Bend 40   Cervical - Left Side Bend 30   Cervical - Right Rotation 60   Cervical - Left Rotation 45  50 at end of treatment                     [facility] Adult PT Treatment/Exercise - 02/29/16 0001    Neck Exercises: Supine   Neck Retraction 15 reps;3 secs   Neck Retraction Limitations and with rotations   Shoulder Exercises: Standing   External Rotation Both;15 reps;Theraband   Theraband Level (Shoulder External Rotation) Level 2 (Red)   Row 15 reps;Theraband  2 sets   Theraband Level (Shoulder Row) Level 2 (Red)   Moist Heat Therapy   Number Minutes Moist Heat 15 Minutes   Moist Heat Location Cervical   Manual Therapy   Manual therapy comments manual traction, PROM, STM   Neck Exercises: Stretches   Upper Trapezius Stretch 2 reps;30 seconds   Levator Stretch 3 reps;30 seconds                  PT Short Term Goals - 02/29/16 1334    PT SHORT TERM GOAL #  1   Title Patient will demonstrate appropriate posture and form independently with HEP as it is developed. by 02/29/16   Time 3   Period Weeks   Status On-going   PT SHORT TERM GOAL #2   Title Decreased pain to average of 4/10 by 02/29/16   Time 3   Period Weeks   Status On-going           PT Long Term Goals - 02/08/16 1416    PT LONG TERM GOAL #1   Title Left cervical rotation to 60 deg actively without end range pain by 5/30   Time 6   Period Weeks   Status New   PT LONG TERM GOAL #2   Title Left cervical sidebend to 20 deg without end range pain by 5/30   Time 6   Period Weeks   Status New   PT LONG TERM GOAL #3   Title Bilateral shoulder external rotation MMT 4+/5 by 5/30   PT LONG TERM GOAL #4   Title average pain <2/10 by 5/30   Time 6   Period Weeks   Status New   PT LONG TERM GOAL #5   Title Pt will verbalize improved awareness of posture to avoid painful movements.    Time 6   Period Weeks   Status New   Additional Long Term Goals   Additional Long Term Goals Yes   PT LONG  TERM GOAL #6   Title FOTO to 61 by 5/30   Baseline 54 at evaluation   Time 6   Period Weeks   Status New               Plan - 02/29/16 1429    Clinical Impression Statement Manual treatment focusing on left upper trap and levator with trigger point release and active release as well as PROM. Pt's left rotation has improved and improved 5 more degrees post treatment today.    PT Next Visit Plan review HEP, rows, triceps pulls, cervical rotation ball on wall. MANUAL      Patient will benefit from skilled therapeutic intervention in order to improve the following deficits and impairments:  Decreased range of motion, Pain, Improper body mechanics, Decreased mobility, Decreased strength, Postural dysfunction  Visit Diagnosis: Abnormal posture  Cervicalgia  Muscle weakness (generalized)     Problem List Patient Active Problem List   Diagnosis Date Noted  . Endoleak post (EVAR) endovascular aneurysm repair ([place]) 01/08/2015  . Abdominal aortic aneurysm ([place])   . Occlusion and stenosis of carotid artery without mention of cerebral infarction 06/01/2014  . Fall in elderly patient 03/30/2014  . Loose stools 03/30/2014  . Closed fracture of proximal phalanx of right hand 03/30/2014  . Multiple fractures of ribs of left side 03/30/2014  . Chronic cough 01/13/2014  . Abdominal aneurysm without mention of rupture 12/01/2013  . Anxiety state 11/19/2013  . Hyperlipidemia 09/03/2013  . Insomnia 03/20/2013  . Allergic rhinitis 03/20/2013    Dorene Ar, PTA 02/29/2016, 2:34 PM  [facility] [address] [place], [place], [address] Phone: [phone]   Fax:  [phone]  Name: Andrew Vega MRN: [id number] Date of Birth: Nov 05, 1925

## 2016-03-02 ENCOUNTER — Ambulatory Visit: Payer: Medicare Other | Admitting: Physical Therapy

## 2016-03-02 DIAGNOSIS — R293 Abnormal posture: Secondary | ICD-10-CM

## 2016-03-02 DIAGNOSIS — M542 Cervicalgia: Secondary | ICD-10-CM | POA: Diagnosis not present

## 2016-03-02 DIAGNOSIS — M6281 Muscle weakness (generalized): Secondary | ICD-10-CM

## 2016-03-02 NOTE — Therapy (Signed)
[place], [place], [address] Phone: [phone]   Fax:  [phone]  Physical Therapy Treatment  Patient Details  Name: Andrew Vega MRN: [id number] Date of Birth: May 05, 1926 Referring Provider: Gayland Curry  Encounter Date: 03/02/2016      PT End of Session - 03/02/16 1019    Visit Number 4   Number of Visits 13   Date for PT Re-Evaluation 03/21/16   Authorization Type Medicare, KX at visit 15   PT Start Time 1017   PT Stop Time 1115   PT Time Calculation (min) 58 min      Past Medical History  Diagnosis Date  . Anxiety   . Hyperlipidemia   . Abdominal aneurysm without mention of rupture   . Insomnia, unspecified   . Colon polyps   . Arthritis   . Diverticulosis of colon (without mention of hemorrhage)   . COPD (chronic obstructive pulmonary disease) ([place])   . Inguinal hernia   . Anginal pain ([place])     recent hospitalization  . H/O hiatal hernia   . Cancer [facility]) Jan. 2017    skin cancer removal/ Right medial lower Leg    Past Surgical History  Procedure Laterality Date  . Vasectomy  1962  . Cholecystectomy  1962  . Colon surgery  2007  . Bilateral shoulder repair    . Nasal septum surgery  2011  . Hernia repair    . Abdominal aortic endovascular stent graft N/A 10/29/2013    Procedure: ABDOMINAL AORTIC ENDOVASCULAR STENT GRAFT;  Surgeon: [name], MD;  Location: [facility] OR;  Service: Vascular;  Laterality: N/A;  . Mohs surgery Right 11/2015    lower leg    There were no vitals filed for this visit.      Subjective Assessment - 03/02/16 1039    Subjective The massage helped but I still have pain.    Currently in Pain? No/denies  up to 5/10    Aggravating Factors  turning to the left                          [facility] Adult PT Treatment/Exercise - 03/02/16 0001    Neck Exercises: Supine   Neck Retraction 10 reps   Neck Retraction Limitations on  pillow-gently, nods x5 increased pain   Other Supine Exercise supine scap retraction x 10  for 5 sec each   Shoulder Exercises: Supine   Other Supine Exercises supine scap stab with red band horizontal abdct, ER, pullovers x 10 each   Moist Heat Therapy   Number Minutes Moist Heat 15 Minutes   Moist Heat Location Cervical   Manual Therapy   Manual therapy comments soft tissue work and trigger pont release left levator and upper trap, passive rotation and side bend                  PT Short Term Goals - 02/29/16 1334    PT SHORT TERM GOAL #1   Title Patient will demonstrate appropriate posture and form independently with HEP as it is developed. by 02/29/16   Time 3   Period Weeks   Status On-going   PT SHORT TERM GOAL #2   Title Decreased pain to average of 4/10 by 02/29/16   Time 3   Period Weeks   Status On-going           PT Long Term Goals - 02/08/16 1416  PT LONG TERM GOAL #1   Title Left cervical rotation to 60 deg actively without end range pain by 5/30   Time 6   Period Weeks   Status New   PT LONG TERM GOAL #2   Title Left cervical sidebend to 20 deg without end range pain by 5/30   Time 6   Period Weeks   Status New   PT LONG TERM GOAL #3   Title Bilateral shoulder external rotation MMT 4+/5 by 5/30   PT LONG TERM GOAL #4   Title average pain <2/10 by 5/30   Time 6   Period Weeks   Status New   PT LONG TERM GOAL #5   Title Pt will verbalize improved awareness of posture to avoid painful movements.    Time 6   Period Weeks   Status New   Additional Long Term Goals   Additional Long Term Goals Yes   PT LONG TERM GOAL #6   Title FOTO to 61 by 5/30   Baseline 54 at evaluation   Time 6   Period Weeks   Status New               Plan - 03/02/16 1125    Clinical Impression Statement Instructed pt in supine scap stabilization and cervical stabilization. Discomfort with gentle chin tucks. Trigger point release and HMP to left upper trap  and levator with relief reported post treatment.    PT Next Visit Plan review HEP, rows, triceps pulls, cervical rotation ball on wall. MANUAL- add supine scapular bands if tolerated well. Check Goals.      Patient will benefit from skilled therapeutic intervention in order to improve the following deficits and impairments:  Decreased range of motion, Pain, Improper body mechanics, Decreased mobility, Decreased strength, Postural dysfunction  Visit Diagnosis: Cervicalgia  Muscle weakness (generalized)  Abnormal posture     Problem List Patient Active Problem List   Diagnosis Date Noted  . Endoleak post (EVAR) endovascular aneurysm repair ([place]) 01/08/2015  . Abdominal aortic aneurysm ([place])   . Occlusion and stenosis of carotid artery without mention of cerebral infarction 06/01/2014  . Fall in elderly patient 03/30/2014  . Loose stools 03/30/2014  . Closed fracture of proximal phalanx of right hand 03/30/2014  . Multiple fractures of ribs of left side 03/30/2014  . Chronic cough 01/13/2014  . Abdominal aneurysm without mention of rupture 12/01/2013  . Anxiety state 11/19/2013  . Hyperlipidemia 09/03/2013  . Insomnia 03/20/2013  . Allergic rhinitis 03/20/2013    Dorene Ar, PTA 03/02/2016, 11:29 AM  [place], [place], [address] Phone: [phone]   Fax:  [phone]  Name: Andrew Vega MRN: [id number] Date of Birth: 01-14-1926

## 2016-03-07 ENCOUNTER — Ambulatory Visit: Payer: Medicare Other | Admitting: Physical Therapy

## 2016-03-07 ENCOUNTER — Encounter: Payer: Self-pay | Admitting: Physical Therapy

## 2016-03-07 DIAGNOSIS — R293 Abnormal posture: Secondary | ICD-10-CM

## 2016-03-07 DIAGNOSIS — M6281 Muscle weakness (generalized): Secondary | ICD-10-CM

## 2016-03-07 DIAGNOSIS — M542 Cervicalgia: Secondary | ICD-10-CM

## 2016-03-07 NOTE — Therapy (Signed)
[place], [place], [address] Phone: [phone]   Fax:  [phone]  Physical Therapy Treatment  Patient Details  Name: Andrew Vega MRN: [id number] Date of Birth: Nov 16, 1925 Referring Provider: Gayland Curry  Encounter Date: 03/07/2016      PT End of Session - 03/07/16 1320    Visit Number 5   Number of Visits 13   Date for PT Re-Evaluation 03/21/16   Authorization Type Medicare, KX at visit 15   PT Start Time 1330   PT Stop Time 1422   PT Time Calculation (min) 52 min   Activity Tolerance Patient tolerated treatment well   Behavior During Therapy [facility] for tasks assessed/performed      Past Medical History  Diagnosis Date  . Anxiety   . Hyperlipidemia   . Abdominal aneurysm without mention of rupture   . Insomnia, unspecified   . Colon polyps   . Arthritis   . Diverticulosis of colon (without mention of hemorrhage)   . COPD (chronic obstructive pulmonary disease) ([place])   . Inguinal hernia   . Anginal pain ([place])     recent hospitalization  . H/O hiatal hernia   . Cancer [facility]) Jan. 2017    skin cancer removal/ Right medial lower Leg    Past Surgical History  Procedure Laterality Date  . Vasectomy  1962  . Cholecystectomy  1962  . Colon surgery  2007  . Bilateral shoulder repair    . Nasal septum surgery  2011  . Hernia repair    . Abdominal aortic endovascular stent graft N/A 10/29/2013    Procedure: ABDOMINAL AORTIC ENDOVASCULAR STENT GRAFT;  Surgeon: [name], MD;  Location: [facility] OR;  Service: Vascular;  Laterality: N/A;  . Mohs surgery Right 11/2015    lower leg    There were no vitals filed for this visit.      Subjective Assessment - 03/07/16 1321    Subjective Pt reports ending with the heat last visit was very beneficial. Continuous pain has subsided significantly; however, still gets sharp pain when turning to the L.    Currently in Pain? Yes   Pain Score 5   when  turning to the L   Pain Location Neck   Pain Orientation Left   Pain Descriptors / Indicators Sharp   Pain Type Chronic pain            [facility] PT Assessment - 03/07/16 0001    AROM   Cervical - Left Rotation 70 deg end range pain                     [facility] Adult PT Treatment/Exercise - 03/07/16 0001    Neck Exercises: Machines for Strengthening   UBE (Upper Arm Bike) 5 minutes retro only L1.5   Shoulder Exercises: Standing   External Rotation Other (comment)  x30, leaning againts wall   Theraband Level (Shoulder External Rotation) Level 2 (Red)   Extension Other (comment)  30   Theraband Level (Shoulder Extension) Level 2 (Red)   Row Other (comment)  30   Theraband Level (Shoulder Row) Level 3 (Green)   Shoulder Exercises: Pulleys   Flexion 3 minutes   Moist Heat Therapy   Number Minutes Moist Heat 10 Minutes   Moist Heat Location Cervical   Manual Therapy   Manual therapy comments L UT soft tissue, manual traction, PROM  PT Education - 03/07/16 1503    Education provided Yes   Education Details exercise form/rationale, importance of posture, improvements in ROM   Person(s) Educated Patient   Methods Explanation;Demonstration;Tactile cues;Verbal cues   Comprehension Verbalized understanding;Returned demonstration;Verbal cues required;Tactile cues required;Need further instruction          PT Short Term Goals - 02/29/16 1334    PT SHORT TERM GOAL #1   Title Patient will demonstrate appropriate posture and form independently with HEP as it is developed. by 02/29/16   Time 3   Period Weeks   Status On-going   PT SHORT TERM GOAL #2   Title Decreased pain to average of 4/10 by 02/29/16   Time 3   Period Weeks   Status On-going           PT Long Term Goals - 02/08/16 1416    PT LONG TERM GOAL #1   Title Left cervical rotation to 60 deg actively without end range pain by 5/30   Time 6   Period Weeks   Status New   PT LONG  TERM GOAL #2   Title Left cervical sidebend to 20 deg without end range pain by 5/30   Time 6   Period Weeks   Status New   PT LONG TERM GOAL #3   Title Bilateral shoulder external rotation MMT 4+/5 by 5/30   PT LONG TERM GOAL #4   Title average pain <2/10 by 5/30   Time 6   Period Weeks   Status New   PT LONG TERM GOAL #5   Title Pt will verbalize improved awareness of posture to avoid painful movements.    Time 6   Period Weeks   Status New   Additional Long Term Goals   Additional Long Term Goals Yes   PT LONG TERM GOAL #6   Title FOTO to 61 by 5/30   Baseline 54 at evaluation   Time 6   Period Weeks   Status New               Plan - 03/07/16 1504    Clinical Impression Statement Pt demo tremor today in L hand when performing SNAGs that he reports not having in the past. The tremor occurred only when L GHJ was abducted and internally rotated pulling pillow case. No loss of strength or sensation, no stroke-like symptoms, denied HA or dizziness. Was instructed to call PCP following therapy to let them know it occurred. Pt demo fatigue with exercises today and required cuing to maintain appropriate posture. Was able to lay supine with only one pillow and no pain following manual treatment.    PT Treatment/Interventions ADLs/Self Care Home Management;Cryotherapy;Moist Heat;Therapeutic exercise;Therapeutic activities;Functional mobility training;Traction;Neuromuscular re-education;Patient/family education;Manual techniques;Passive range of motion   PT Next Visit Plan review HEP, triceps pulls, cervical rotation ball on wall. MANUAL- add supine scapular bands if tolerated well   PT Home Exercise Plan [facility] yellowTB, scapular retractions   Consulted and Agree with Plan of Care Patient      Patient will benefit from skilled therapeutic intervention in order to improve the following deficits and impairments:  Decreased range of motion, Pain, Improper body mechanics,  Decreased mobility, Decreased strength, Postural dysfunction  Visit Diagnosis: Cervicalgia  Muscle weakness (generalized)  Abnormal posture     Problem List Patient Active Problem List   Diagnosis Date Noted  . Endoleak post (EVAR) endovascular aneurysm repair ([place]) 01/08/2015  . Abdominal aortic aneurysm ([place])   .  Occlusion and stenosis of carotid artery without mention of cerebral infarction 06/01/2014  . Fall in elderly patient 03/30/2014  . Loose stools 03/30/2014  . Closed fracture of proximal phalanx of right hand 03/30/2014  . Multiple fractures of ribs of left side 03/30/2014  . Chronic cough 01/13/2014  . Abdominal aneurysm without mention of rupture 12/01/2013  . Anxiety state 11/19/2013  . Hyperlipidemia 09/03/2013  . Insomnia 03/20/2013  . Allergic rhinitis 03/20/2013   Liani Caris C. Oiva Dibari PT, DPT 03/07/2016 3:09 PM   [facility] [address] [place], [place], [address] Phone: [phone]   Fax:  [phone]  Name: Andrew Vega MRN: [id number] Date of Birth: 05-10-1926

## 2016-03-09 ENCOUNTER — Encounter: Payer: Self-pay | Admitting: Internal Medicine

## 2016-03-09 ENCOUNTER — Ambulatory Visit: Payer: Medicare Other | Admitting: Physical Therapy

## 2016-03-09 ENCOUNTER — Ambulatory Visit (INDEPENDENT_AMBULATORY_CARE_PROVIDER_SITE_OTHER): Payer: Medicare Other | Admitting: Internal Medicine

## 2016-03-09 VITALS — BP 120/70 | HR 82 | Temp 98.5°F | Ht 68.0 in | Wt 205.0 lb

## 2016-03-09 DIAGNOSIS — I6523 Occlusion and stenosis of bilateral carotid arteries: Secondary | ICD-10-CM

## 2016-03-09 DIAGNOSIS — G252 Other specified forms of tremor: Secondary | ICD-10-CM | POA: Diagnosis not present

## 2016-03-09 DIAGNOSIS — M5030 Other cervical disc degeneration, unspecified cervical region: Secondary | ICD-10-CM

## 2016-03-09 DIAGNOSIS — M6281 Muscle weakness (generalized): Secondary | ICD-10-CM | POA: Diagnosis not present

## 2016-03-09 DIAGNOSIS — M542 Cervicalgia: Secondary | ICD-10-CM

## 2016-03-09 DIAGNOSIS — R293 Abnormal posture: Secondary | ICD-10-CM

## 2016-03-09 NOTE — Progress Notes (Signed)
Location:  [facility] clinic Provider: Aydenn Gervin L. Mariea Clonts, D.O., C.M.D.  Code Status: DNR Goals of Care:  Advanced Directives 03/09/2016  Does patient have an advance directive? Yes  Type of Advance Directive [place]  Does patient want to make changes to advanced directive? -  Copy of advanced directive(s) in chart? Yes   Chief Complaint  Patient presents with  . Acute Visit    pain in arm, left arm tremors    HPI: Patient is a 82 y.o. male seen today for an acute visit for tremor of his left arm when he was elevating in to do therapy for his neck.  He has known degenerative disease of his cervical spine.  He reports that when lifting his left arm up above his head, his fingers, especially his index begins to tremor.  He cannot control it.  It's minimal on the right and minimal with action.  No other resting tremor detected.    Past Medical History  Diagnosis Date  . Anxiety   . Hyperlipidemia   . Abdominal aneurysm without mention of rupture   . Insomnia, unspecified   . Colon polyps   . Arthritis   . Diverticulosis of colon (without mention of hemorrhage)   . COPD (chronic obstructive pulmonary disease) ([place])   . Inguinal hernia   . Anginal pain ([place])     recent hospitalization  . H/O hiatal hernia   . Cancer [facility]) Jan. 2017    skin cancer removal/ Right medial lower Leg    Past Surgical History  Procedure Laterality Date  . Vasectomy  1962  . Cholecystectomy  1962  . Colon surgery  2007  . Bilateral shoulder repair    . Nasal septum surgery  2011  . Hernia repair    . Abdominal aortic endovascular stent graft N/A 10/29/2013    Procedure: ABDOMINAL AORTIC ENDOVASCULAR STENT GRAFT;  Surgeon: [name], MD;  Location: [facility] OR;  Service: Vascular;  Laterality: N/A;  . Mohs surgery Right 11/2015    lower leg    Allergies  Allergen Reactions  . Celebrex [Celecoxib] Other (See Comments)    Raised B/P   . Codeine Other (See Comments)    Severe stomach  pain       Medication List       This list is accurate as of: 03/09/16  3:11 PM.  Always use your most recent med list.               aspirin 81 MG tablet  Take 81 mg by mouth daily.     CHLOR-TRIMETON 4 MG tablet  Generic drug:  chlorpheniramine  Take 4-8 mg by mouth every 6 (six) hours as needed for allergies.     citalopram 40 MG tablet  Commonly known as:  CELEXA  TAKE 1 TABLET BY MOUTH EVERY DAY.     fexofenadine 180 MG tablet  Commonly known as:  ALLEGRA  Take 1 tablet (180 mg total) by mouth daily.     Fish Oil 1000 MG Caps  Take 1 capsule by mouth daily.     GLUCOSAMINE CHONDR 1500 COMPLX PO  Take 1 capsule by mouth daily.     LORazepam 1 MG tablet  Commonly known as:  ATIVAN  TAKE 1/2 TABLET BY MOUTH EVERY MORNING AND 1 TABLET EVERY NIGHT AT BEDTIME     Melatonin 3 MG Tabs  Take 2 tablets by mouth at bedtime.     multivitamin tablet  Take  1 tablet by mouth daily.     niacin 100 MG tablet  Take 250 mg by mouth daily with breakfast.     OVER THE COUNTER MEDICATION  Place 1 drop into both eyes every morning.     simvastatin 40 MG tablet  Commonly known as:  ZOCOR  TAKE 1 TABLET BY MOUTH AT BEDTIME FOR CHOLESTEROL     SUPER B COMPLEX PO  Take 1 tablet by mouth daily.     Vitamin D 2000 units Caps  Take 2,000 Units by mouth daily.     vitamin E 400 UNIT capsule  Take 400 Units by mouth daily.        Review of Systems:  Review of Systems  Constitutional: Positive for malaise/fatigue. Negative for fever and chills.  Musculoskeletal: Positive for myalgias and neck pain. Negative for falls.  Neurological: Positive for tremors. Negative for dizziness, tingling, sensory change, focal weakness, seizures and loss of consciousness.  Psychiatric/Behavioral: Positive for memory loss. The patient is nervous/anxious.     Health Maintenance  Topic Date Due  . ZOSTAVAX  06/23/2016 (Originally 08/07/1988)  . INFLUENZA VACCINE  05/23/2016  .  TETANUS/TDAP  10/23/2021  . PNA vac Low Risk Adult  Completed    Physical Exam: Filed Vitals:   03/09/16 1441  BP: 120/70  Pulse: 82  Temp: 98.5 F (36.9 C)  TempSrc: Oral  Height: 5\' 8"  (1.727 m)  Weight: 205 lb (92.987 kg)  SpO2: 96%   Body mass index is 31.18 kg/(m^2). Physical Exam  Constitutional: He is oriented to person, place, and time. He appears well-developed and well-nourished. No distress.  Cardiovascular: Normal rate and regular rhythm.   Pulmonary/Chest: Effort normal and breath sounds normal.  Neurological: He is alert and oriented to person, place, and time.  Some notable short term memory loss; does have tremor of second digit of right hand predominantly when he flexes his arm at the shoulder; also mild bilateral action tremor during finger to nose with less coordination on the left; not painful; still with some torticollis left cspine  Skin: Skin is warm and dry.    Labs reviewed: Basic Metabolic Panel:  Recent Labs  06/08/15 1259 06/21/15 0845  NA  --  142  K  --  4.4  CL  --  103  CO2  --  22  GLUCOSE  --  101*  BUN 24* 25  CREATININE 1.51* 1.56*  CALCIUM  --  9.2   Liver Function Tests:  Recent Labs  06/21/15 0845  AST 24  ALT 21  ALKPHOS 60  BILITOT 0.2  PROT 6.7  ALBUMIN 4.0   No results for input(s): LIPASE, AMYLASE in the last 8760 hours. No results for input(s): AMMONIA in the last 8760 hours. CBC:  Recent Labs  06/21/15 0845  WBC 6.3  NEUTROABS 3.6  HCT 38.3  MCV 93  PLT 162   Lipid Panel:  Recent Labs  06/21/15 0845  CHOL 156  HDL 47  LDLCALC 87  TRIG 112  CHOLHDL 3.3  Assessment/Plan 1. DDD (degenerative disc disease), cervical -seems tremor is due to compression of a nerve by the arthritis of his neck--he can accept this and did not want a more significant workup--cont PT for the torticollis and muscle spasms associated and to improved neck ROM  2. 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 Troi Bechtold, D.O. [facility] [address] [place], [place] [address] Cell Phone (Mon-Fri 8am-5pm):  [phone] On Call:  [phone] & follow prompts after 5pm & weekends Office Phone:  [phone] Office Fax:  [phone]

## 2016-03-09 NOTE — Patient Instructions (Signed)
If your tremor worsens, we will investigate further with a neurology referral.

## 2016-03-09 NOTE — Therapy (Signed)
[place], [place], [address] Phone: [phone]   Fax:  [phone]  Physical Therapy Treatment  Patient Details  Name: Andrew Vega MRN: [id number] Date of Birth: 10-31-1925 Referring Provider: Gayland Curry  Encounter Date: 03/09/2016      PT End of Session - 03/09/16 1236    Visit Number 6   Number of Visits 13   Date for PT Re-Evaluation 03/21/16   Authorization Type Medicare, KX at visit 15   PT Start Time 1115  pt came in early and was ok with a 30 minute treat   PT Stop Time 1153   PT Time Calculation (min) 38 min   Activity Tolerance Patient tolerated treatment well   Behavior During Therapy [facility] for tasks assessed/performed      Past Medical History  Diagnosis Date  . Anxiety   . Hyperlipidemia   . Abdominal aneurysm without mention of rupture   . Insomnia, unspecified   . Colon polyps   . Arthritis   . Diverticulosis of colon (without mention of hemorrhage)   . COPD (chronic obstructive pulmonary disease) ([place])   . Inguinal hernia   . Anginal pain ([place])     recent hospitalization  . H/O hiatal hernia   . Cancer [facility]) Jan. 2017    skin cancer removal/ Right medial lower Leg    Past Surgical History  Procedure Laterality Date  . Vasectomy  1962  . Cholecystectomy  1962  . Colon surgery  2007  . Bilateral shoulder repair    . Nasal septum surgery  2011  . Hernia repair    . Abdominal aortic endovascular stent graft N/A 10/29/2013    Procedure: ABDOMINAL AORTIC ENDOVASCULAR STENT GRAFT;  Surgeon: [name], MD;  Location: [facility] OR;  Service: Vascular;  Laterality: N/A;  . Mohs surgery Right 11/2015    lower leg    There were no vitals filed for this visit.      Subjective Assessment - 03/09/16 1119    Subjective "last session I felt great at the end, the pain in the neck keeps coming back"    Currently in Pain? Yes   Pain Score 5    Pain Location Neck   Pain  Orientation Left   Pain Type Chronic pain   Pain Onset More than a month ago   Pain Frequency Constant   Aggravating Factors  turning the head to the left    Pain Relieving Factors not turning the head                         [facility] Adult PT Treatment/Exercise - 03/09/16 0001    Moist Heat Therapy   Number Minutes Moist Heat 10 Minutes   Moist Heat Location Cervical;Shoulder  L upper trap   Manual Therapy   Manual Therapy Myofascial release;Soft tissue mobilization   Manual therapy comments contract/ relax of L upper trap x 2: 30 sec hold with 10 sec contraction   Soft tissue mobilization STM over    Myofascial Release rolling/ stretching of L upper trap                PT Education - 03/09/16 1235    Education provided Yes   Education Details Dry needling education, benefits of dry needling and follow up care   Person(s) Educated Patient   Methods Explanation   Comprehension Verbalized understanding  PT Short Term Goals - 02/29/16 1334    PT SHORT TERM GOAL #1   Title Patient will demonstrate appropriate posture and form independently with HEP as it is developed. by 02/29/16   Time 3   Period Weeks   Status On-going   PT SHORT TERM GOAL #2   Title Decreased pain to average of 4/10 by 02/29/16   Time 3   Period Weeks   Status On-going           PT Long Term Goals - 02/08/16 1416    PT LONG TERM GOAL #1   Title Left cervical rotation to 60 deg actively without end range pain by 5/30   Time 6   Period Weeks   Status New   PT LONG TERM GOAL #2   Title Left cervical sidebend to 20 deg without end range pain by 5/30   Time 6   Period Weeks   Status New   PT LONG TERM GOAL #3   Title Bilateral shoulder external rotation MMT 4+/5 by 5/30   PT LONG TERM GOAL #4   Title average pain <2/10 by 5/30   Time 6   Period Weeks   Status New   PT LONG TERM GOAL #5   Title Pt will verbalize improved awareness of posture to avoid painful  movements.    Time 6   Period Weeks   Status New   Additional Long Term Goals   Additional Long Term Goals Yes   PT LONG TERM GOAL #6   Title FOTO to 61 by 5/30   Baseline 54 at evaluation   Time 6   Period Weeks   Status New               Plan - 03/09/16 1237    Clinical Impression Statement Educated about dry needling and pt provided consent for DN of the L upper trap x 2; pt was monitored during treatment. Following DN myofascial release and DTM was performed with stretching and he reported he was able turn his head to the L without pain. Utlized MHP post session to calm down soreness from DN.    PT Next Visit Plan assess response to DN, Review HEP, Tricep pulls, Cervical rotation ball on wall, Manuall, supine scapular bands if tolerated   Consulted and Agree with Plan of Care Patient      Patient will benefit from skilled therapeutic intervention in order to improve the following deficits and impairments:  Decreased range of motion, Pain, Improper body mechanics, Decreased mobility, Decreased strength, Postural dysfunction  Visit Diagnosis: Cervicalgia  Muscle weakness (generalized)  Abnormal posture     Problem List Patient Active Problem List   Diagnosis Date Noted  . Endoleak post (EVAR) endovascular aneurysm repair ([place]) 01/08/2015  . Abdominal aortic aneurysm ([place])   . Occlusion and stenosis of carotid artery without mention of cerebral infarction 06/01/2014  . Fall in elderly patient 03/30/2014  . Loose stools 03/30/2014  . Closed fracture of proximal phalanx of right hand 03/30/2014  . Multiple fractures of ribs of left side 03/30/2014  . Chronic cough 01/13/2014  . Abdominal aneurysm without mention of rupture 12/01/2013  . Anxiety state 11/19/2013  . Hyperlipidemia 09/03/2013  . Insomnia 03/20/2013  . Allergic rhinitis 03/20/2013   Starr Lake PT, DPT, LAT, ATC  03/09/2016  12:47 PM      [place]  [facility] [address] [place], [place], [address] Phone: [phone]   Fax:  [phone]  Name: Andrew Vega MRN: [id number] Date of Birth: 04/05/1926

## 2016-03-10 ENCOUNTER — Telehealth: Payer: Self-pay | Admitting: *Deleted

## 2016-03-10 MED ORDER — AMBULATORY NON FORMULARY MEDICATION: Status: DC

## 2016-03-10 NOTE — Telephone Encounter (Signed)
Ok, this was for his ear.  Thanks.

## 2016-03-10 NOTE — Telephone Encounter (Signed)
Patient called and stated that you wanted to know what cream his Dermatologist gave him and it was Triaminolone Acetonide 0.1% Cream.

## 2016-03-10 NOTE — Telephone Encounter (Signed)
Updated medication list

## 2016-03-12 ENCOUNTER — Other Ambulatory Visit: Payer: Self-pay | Admitting: Internal Medicine

## 2016-03-13 DIAGNOSIS — F329 Major depressive disorder, single episode, unspecified: Secondary | ICD-10-CM | POA: Diagnosis not present

## 2016-03-13 DIAGNOSIS — F418 Other specified anxiety disorders: Secondary | ICD-10-CM | POA: Diagnosis not present

## 2016-03-15 ENCOUNTER — Ambulatory Visit: Payer: Medicare Other | Admitting: Physical Therapy

## 2016-03-15 DIAGNOSIS — M542 Cervicalgia: Secondary | ICD-10-CM

## 2016-03-15 DIAGNOSIS — R293 Abnormal posture: Secondary | ICD-10-CM

## 2016-03-15 DIAGNOSIS — M6281 Muscle weakness (generalized): Secondary | ICD-10-CM | POA: Diagnosis not present

## 2016-03-15 NOTE — Therapy (Signed)
[place], [place], [address] Phone: [phone]   Fax:  [phone]  Physical Therapy Treatment  Patient Details  Name: Andrew Vega MRN: [id number] Date of Birth: 09/16/1926 Referring Provider: Gayland Curry  Encounter Date: 03/15/2016      PT End of Session - 03/15/16 1407    Visit Number 7   Number of Visits 13   Date for PT Re-Evaluation 03/21/16   Authorization Type Medicare, KX at visit 15   PT Start Time 1330   PT Stop Time 1418   PT Time Calculation (min) 48 min   Activity Tolerance Patient tolerated treatment well   Behavior During Therapy [facility] for tasks assessed/performed      Past Medical History  Diagnosis Date  . Anxiety   . Hyperlipidemia   . Abdominal aneurysm without mention of rupture   . Insomnia, unspecified   . Colon polyps   . Arthritis   . Diverticulosis of colon (without mention of hemorrhage)   . COPD (chronic obstructive pulmonary disease) ([place])   . Inguinal hernia   . Anginal pain ([place])     recent hospitalization  . H/O hiatal hernia   . Cancer [facility]) Jan. 2017    skin cancer removal/ Right medial lower Leg    Past Surgical History  Procedure Laterality Date  . Vasectomy  1962  . Cholecystectomy  1962  . Colon surgery  2007  . Bilateral shoulder repair    . Nasal septum surgery  2011  . Hernia repair    . Abdominal aortic endovascular stent graft N/A 10/29/2013    Procedure: ABDOMINAL AORTIC ENDOVASCULAR STENT GRAFT;  Surgeon: [name], MD;  Location: [facility] OR;  Service: Vascular;  Laterality: N/A;  . Mohs surgery Right 11/2015    lower leg    There were no vitals filed for this visit.      Subjective Assessment - 03/15/16 1328    Subjective "The needling really made a difference last session"    Currently in Pain? Yes   Pain Score 4    Pain Location Neck   Pain Orientation Left   Pain Descriptors / Indicators Aching;Sore   Pain Type Chronic  pain   Pain Onset More than a month ago   Pain Frequency Constant   Aggravating Factors  looking to the L   Pain Relieving Factors resting                          [facility] Adult PT Treatment/Exercise - 03/15/16 1402    Shoulder Exercises: Supine   Horizontal ABduction AROM;Strengthening;Both;10 reps;Theraband   Theraband Level (Shoulder Horizontal ABduction) Level 2 (Red)   Other Supine Exercises external rotation with scapular rotation 1 x 10   red theraband   Moist Heat Therapy   Number Minutes Moist Heat 10 Minutes   Moist Heat Location Cervical;Shoulder   Manual Therapy   Manual therapy comments contract/ relax of L upper trap x 2: 30 sec hold with 10 sec contraction   Soft tissue mobilization STM over L upper trap   Myofascial Release rolling/ stretching of L upper trap   Neck Exercises: Stretches   Upper Trapezius Stretch 3 reps;30 seconds   Levator Stretch 2 reps;30 seconds          Trigger Point Dry Needling - 03/15/16 1406    Consent Given? Yes   Education Handout Provided Yes   Muscles Treated Upper Body  Upper trapezius   Upper Trapezius Response Twitch reponse elicited;Palpable increased muscle length              PT Education - 03/15/16 1407    Education provided Yes   Education Details reviewed HEP with form and reps/ sets. Discussed importance of doing his HEP to maintain his progress that is acheived at PT.    Person(s) Educated Patient   Methods Explanation;Verbal cues   Comprehension Verbalized understanding          PT Short Term Goals - 03/15/16 1410    PT SHORT TERM GOAL #1   Title Patient will demonstrate appropriate posture and form independently with HEP as it is developed. by 02/29/16   Time 3   Period Weeks   Status On-going   PT SHORT TERM GOAL #2   Title Decreased pain to average of 4/10 by 02/29/16   Time 3   Period Weeks   Status Achieved           PT Long Term Goals - 03/15/16 1411    PT LONG TERM GOAL #1    Title Left cervical rotation to 60 deg actively without end range pain by 5/30   Time 6   Period Weeks   Status On-going   PT LONG TERM GOAL #2   Title Left cervical sidebend to 20 deg without end range pain by 5/30   Time 6   Period Weeks   Status On-going   PT LONG TERM GOAL #3   Title Bilateral shoulder external rotation MMT 4+/5 by 5/30   Time 6   Period Weeks   Status On-going   PT LONG TERM GOAL #4   Title average pain <2/10 by 5/30   Time 6   Period Weeks   Status On-going   PT LONG TERM GOAL #5   Title Pt will verbalize improved awareness of posture to avoid painful movements.    Time 6   Period Weeks   Status On-going   PT LONG TERM GOAL #6   Title FOTO to 46 by 5/30   Time 6   Period Weeks   Status On-going               Plan - 03/15/16 1408    Clinical Impression Statement Mr. Sammarco reproted relief of pain since the last session due to the DN. peformed DN in the L upper trap x 2 and monitored pt throughout treatment. following DN and manual he was able to perform exercises and stretches with report of decreased pain. utlized MHP post session to calm down soreness from DN.  pt met STG #2 today.    PT Next Visit Plan assess response to DN, Review HEP, Tricep pulls, Cervical rotation ball on wall, Manuall, supine scapular bands if tolerated   PT Home Exercise Plan HEP review   Consulted and Agree with Plan of Care Patient      Patient will benefit from skilled therapeutic intervention in order to improve the following deficits and impairments:  Decreased range of motion, Pain, Improper body mechanics, Decreased mobility, Decreased strength, Postural dysfunction  Visit Diagnosis: Cervicalgia  Muscle weakness (generalized)  Abnormal posture     Problem List Patient Active Problem List   Diagnosis Date Noted  . DDD (degenerative disc disease), cervical 03/09/2016  . Postural tremor 03/09/2016  . Endoleak post (EVAR) endovascular aneurysm repair  ([place]) 01/08/2015  . Abdominal aortic aneurysm ([place])   . Occlusion and stenosis of carotid artery  without mention of cerebral infarction 06/01/2014  . Fall in elderly patient 03/30/2014  . Loose stools 03/30/2014  . Closed fracture of proximal phalanx of right hand 03/30/2014  . Multiple fractures of ribs of left side 03/30/2014  . Chronic cough 01/13/2014  . Abdominal aneurysm without mention of rupture 12/01/2013  . Anxiety state 11/19/2013  . Hyperlipidemia 09/03/2013  . Insomnia 03/20/2013  . Allergic rhinitis 03/20/2013   Starr Lake PT, DPT, LAT, ATC  03/15/2016  2:20 PM      [place] [facility] [address] [place], [place], [address] Phone: [phone]   Fax:  [phone]  Name: Andrew Vega MRN: [id number] Date of Birth: 1925/12/27

## 2016-03-17 ENCOUNTER — Encounter: Payer: Medicare Other | Admitting: Physical Therapy

## 2016-03-22 ENCOUNTER — Ambulatory Visit: Payer: Medicare Other | Admitting: Physical Therapy

## 2016-03-22 DIAGNOSIS — M542 Cervicalgia: Secondary | ICD-10-CM

## 2016-03-22 DIAGNOSIS — M6281 Muscle weakness (generalized): Secondary | ICD-10-CM | POA: Diagnosis not present

## 2016-03-22 DIAGNOSIS — R293 Abnormal posture: Secondary | ICD-10-CM | POA: Diagnosis not present

## 2016-03-22 NOTE — Therapy (Signed)
[place], [place], [address] Phone: [phone]   Fax:  [phone]  Physical Therapy Treatment / Re-certification note  Patient Details  Name: Andrew Vega MRN: [id number] Date of Birth: 24-Sep-1926 Referring Provider: Gayland Curry  Encounter Date: 03/22/2016      PT End of Session - 03/22/16 1146    Visit Number 8   Number of Visits 13   Date for PT Re-Evaluation 04/12/16   Authorization Type Medicare, KX at visit 15   PT Start Time 1105   PT Stop Time 1153   PT Time Calculation (min) 48 min   Activity Tolerance Patient tolerated treatment well   Behavior During Therapy [facility] for tasks assessed/performed      Past Medical History  Diagnosis Date  . Anxiety   . Hyperlipidemia   . Abdominal aneurysm without mention of rupture   . Insomnia, unspecified   . Colon polyps   . Arthritis   . Diverticulosis of colon (without mention of hemorrhage)   . COPD (chronic obstructive pulmonary disease) ([place])   . Inguinal hernia   . Anginal pain ([place])     recent hospitalization  . H/O hiatal hernia   . Cancer [facility]) Jan. 2017    skin cancer removal/ Right medial lower Leg    Past Surgical History  Procedure Laterality Date  . Vasectomy  1962  . Cholecystectomy  1962  . Colon surgery  2007  . Bilateral shoulder repair    . Nasal septum surgery  2011  . Hernia repair    . Abdominal aortic endovascular stent graft N/A 10/29/2013    Procedure: ABDOMINAL AORTIC ENDOVASCULAR STENT GRAFT;  Surgeon: [name], MD;  Location: [facility] OR;  Service: Vascular;  Laterality: N/A;  . Mohs surgery Right 11/2015    lower leg    There were no vitals filed for this visit.      Subjective Assessment - 03/22/16 1109    Subjective " I still have some pain but it takes longer for to hit me when I look to the left"    Currently in Pain? Yes   Pain Score 4   end range of L cervical rotatoin   Pain Location Neck   Pain Orientation Left   Pain Descriptors / Indicators Aching;Sore   Pain Type Chronic pain   Pain Onset More than a month ago   Pain Frequency Intermittent   Aggravating Factors  looking to the left   Pain Relieving Factors not going all the way to the L            [facility] PT Assessment - 03/22/16 0001    AROM   Cervical - Right Side Bend 43   Cervical - Left Side Bend 42  soreness at end range   Cervical - Right Rotation 68   Cervical - Left Rotation 75  with ERP   Strength   Right Shoulder External Rotation 3+/5   Left Shoulder External Rotation 3+/5                     [facility] Adult PT Treatment/Exercise - 03/22/16 0001    Neck Exercises: Supine   Upper Extremity D2 Flexion;Extension;10 reps;Other (comment)  with manual resistance x 2 sets   Shoulder Exercises: Supine   Horizontal ABduction AROM;Strengthening;Both;10 reps;Theraband   Theraband Level (Shoulder Horizontal ABduction) Level 2 (Red)   Other Supine Exercises external rotation with scapular rotation 1 x 10  red theraband   Shoulder Exercises: Seated   Retraction AROM;Strengthening;Both;10 reps   Moist Heat Therapy   Number Minutes Moist Heat 10 Minutes   Moist Heat Location Cervical;Shoulder   Manual Therapy   Manual therapy comments contract/ relax of L upper trap x 2: 30 sec hold with 10 sec contraction   Soft tissue mobilization STM over L upper trap   Myofascial Release rolling/ stretching of L upper trap   Neck Exercises: Stretches   Upper Trapezius Stretch 3 reps;30 seconds  reviewed and given as HEP          Trigger Point Dry Needling - 03/22/16 1313    Consent Given? Yes   Education Handout Provided No  given previously   Muscles Treated Upper Body Upper trapezius   Upper Trapezius Response Twitch reponse elicited;Palpable increased muscle length  L only x 2              PT Education - 03/22/16 1145    Education provided Yes   Education Details continued reviewing  previous HEP and importance of consistancy to make further progress. updated HEP with reps/sets and form    Person(s) Educated Patient   Methods Explanation;Handout   Comprehension Verbalized understanding          PT Short Term Goals - 03/22/16 1310    PT SHORT TERM GOAL #1   Title Patient will demonstrate appropriate posture and form independently with HEP as it is developed. by 02/29/16   Time 3   Period Weeks   Status Partially Met   PT SHORT TERM GOAL #2   Title Decreased pain to average of 4/10 by 02/29/16   Time 3   Period Weeks   Status Achieved           PT Long Term Goals - 03/22/16 1310    PT LONG TERM GOAL #1   Title Left cervical rotation to 60 deg actively without end range pain by 5/30   Time 6   Period Weeks   Status Partially Met   PT LONG TERM GOAL #2   Title Left cervical sidebend to 20 deg without end range pain by 5/30   Time 6   Period Weeks   Status Partially Met   PT LONG TERM GOAL #3   Title Bilateral shoulder external rotation MMT 4+/5 by 5/30   Time 6   Period Weeks   Status On-going   PT LONG TERM GOAL #4   Title average pain <2/10 by 5/30   Time 6   Period Weeks   Status On-going   PT LONG TERM GOAL #5   Title Pt will verbalize improved awareness of posture to avoid painful movements.    Time 6   Period Weeks   Status On-going   PT LONG TERM GOAL #6   Title FOTO to 26 by 5/30   Time 6   Period Weeks   Status Unable to assess               Plan - 03/22/16 1305    Clinical Impression Statement Andrew Vega states he hasn't been consistent with his HEP. The dry needling has benefited him helping improving his L cervical rotation but he still has pain noted at end range. pt still has a palpable nodule in the L upper trap but reports it isn't as sore.. DN was performed on the L upper trap which he was monitored throughout treatment.  He was able to perform todays exercise  following the DN. pt reports that he plans to see his MD  and soon. Plan to continue for 1-2 more visits to finalize his HEP and work on the remaining goals.    Rehab Potential Good   PT Frequency 1x / week   PT Duration 3 weeks   PT Next Visit Plan assess response to DN, Review HEP, Tricep pulls, Cervical rotation ball on wall, Manuall, supine scapular bands if tolerated, FOTO    PT Home Exercise Plan HEP review, upper trap stretching   Consulted and Agree with Plan of Care Patient      Patient will benefit from skilled therapeutic intervention in order to improve the following deficits and impairments:  Decreased range of motion, Pain, Improper body mechanics, Decreased mobility, Decreased strength, Postural dysfunction  Visit Diagnosis: Cervicalgia - Plan: PT plan of care cert/re-cert  Muscle weakness (generalized) - Plan: PT plan of care cert/re-cert  Abnormal posture - Plan: PT plan of care cert/re-cert       G-Codes - 04/04/16 1314    Functional Assessment Tool Used clinical judgement   Functional Limitation Other PT primary   Other PT Primary Current Status (V2919) At least 20 percent but less than 40 percent impaired, limited or restricted   Other PT Primary Goal Status (T6606) At least 1 percent but less than 20 percent impaired, limited or restricted      Problem List Patient Active Problem List   Diagnosis Date Noted  . DDD (degenerative disc disease), cervical 03/09/2016  . Postural tremor 03/09/2016  . Endoleak post (EVAR) endovascular aneurysm repair ([place]) 01/08/2015  . Abdominal aortic aneurysm ([place])   . Occlusion and stenosis of carotid artery without mention of cerebral infarction 06/01/2014  . Fall in elderly patient 03/30/2014  . Loose stools 03/30/2014  . Closed fracture of proximal phalanx of right hand 03/30/2014  . Multiple fractures of ribs of left side 03/30/2014  . Chronic cough 01/13/2014  . Abdominal aneurysm without mention of rupture 12/01/2013  . Anxiety state 11/19/2013  . Hyperlipidemia  09/03/2013  . Insomnia 03/20/2013  . Allergic rhinitis 03/20/2013   Starr Lake PT, DPT, LAT, ATC  2016-04-04  1:17 PM      [place] [facility] [address] [place], [place], [address] Phone: [phone]   Fax:  [phone]  Name: Andrew Vega MRN: [id number] Date of Birth: 22-Dec-1925

## 2016-03-23 ENCOUNTER — Encounter: Payer: Medicare Other | Admitting: Physical Therapy

## 2016-03-28 ENCOUNTER — Encounter: Payer: Self-pay | Admitting: Nurse Practitioner

## 2016-03-28 ENCOUNTER — Ambulatory Visit (INDEPENDENT_AMBULATORY_CARE_PROVIDER_SITE_OTHER): Payer: Medicare Other | Admitting: Nurse Practitioner

## 2016-03-28 VITALS — BP 132/86 | HR 79 | Temp 98.5°F | Resp 17 | Ht 68.0 in | Wt 202.0 lb

## 2016-03-28 DIAGNOSIS — I6523 Occlusion and stenosis of bilateral carotid arteries: Secondary | ICD-10-CM | POA: Diagnosis not present

## 2016-03-28 DIAGNOSIS — B372 Candidiasis of skin and nail: Secondary | ICD-10-CM

## 2016-03-28 MED ORDER — TRIAMCINOLONE ACETONIDE 0.1 % EX CREA: 1.0000 "application " | TOPICAL_CREAM | Freq: Two times a day (BID) | CUTANEOUS | Status: DC

## 2016-03-28 MED ORDER — NYSTATIN 100000 UNIT/GM EX POWD: Freq: Three times a day (TID) | CUTANEOUS | Status: DC

## 2016-03-28 NOTE — Progress Notes (Signed)
Patient ID: Andrew Vega, male   DOB: [birth year], 82 y.o.   MRN: [id number]    PCP: Hollace Kinnier, DO  Advanced Directive information Does patient have an advance directive?: Yes, Type of Advance Directive: [place];Living will  Allergies  Allergen Reactions  . Celebrex [Celecoxib] Other (See Comments)    Raised B/P   . Codeine Other (See Comments)    Severe stomach pain     Chief Complaint  Patient presents with  . Acute Visit    Penis discoloration x 1week. no pain,red coloration, possible weeping/drainage.      HPI: Patient is a 82 y.o. male seen in the office today due to penis discoloration for 1 week. Woke up in the morning 2 weeks ago. Patches of Redness and swelling noted. No pain. No injury, no new partners.    Review of Systems:  Review of Systems  Constitutional: Negative for activity change and appetite change.  Genitourinary: Positive for penile swelling. Negative for dysuria, hematuria, discharge, scrotal swelling, difficulty urinating, genital sores, penile pain and testicular pain.    Past Medical History  Diagnosis Date  . Anxiety   . Hyperlipidemia   . Abdominal aneurysm without mention of rupture   . Insomnia, unspecified   . Colon polyps   . Arthritis   . Diverticulosis of colon (without mention of hemorrhage)   . COPD (chronic obstructive pulmonary disease) ([place])   . Inguinal hernia   . Anginal pain ([place])     recent hospitalization  . H/O hiatal hernia   . Cancer [facility]) Jan. 2017    skin cancer removal/ Right medial lower Leg   Past Surgical History  Procedure Laterality Date  . Vasectomy  1962  . Cholecystectomy  1962  . Colon surgery  2007  . Bilateral shoulder repair    . Nasal septum surgery  2011  . Hernia repair    . Abdominal aortic endovascular stent graft N/A 10/29/2013    Procedure: ABDOMINAL AORTIC ENDOVASCULAR STENT GRAFT;  Surgeon: [name], MD;  Location: [facility] OR;  Service: Vascular;  Laterality: N/A;    . Mohs surgery Right 11/2015    lower leg   Social History:   reports that he quit smoking about 31 years ago. His smoking use included Cigarettes. He has a 18.75 pack-year smoking history. He has never used smokeless tobacco. He reports that he drinks about 0.6 oz of alcohol per week. He reports that he does not use illicit drugs.  Family History  Problem Relation Age of Onset  . Colon cancer Neg Hx   . Heart attack Father 28    Medications: Patient's Medications  New Prescriptions   No medications on file  Previous Medications   AMBULATORY NON FORMULARY MEDICATION    Triaminolone Acetonide 0.1% Cream   ASPIRIN 81 MG TABLET    Take 81 mg by mouth daily.   B COMPLEX-C (SUPER B COMPLEX PO)    Take 1 tablet by mouth daily.    CHLORPHENIRAMINE (CHLOR-TRIMETON) 4 MG TABLET    Take 4-8 mg by mouth every 6 (six) hours as needed for allergies.    CHOLECALCIFEROL (VITAMIN D) 2000 UNITS CAPS    Take 2,000 Units by mouth daily.    CITALOPRAM (CELEXA) 40 MG TABLET    TAKE 1 TABLET BY MOUTH EVERY DAY.   FEXOFENADINE (ALLEGRA) 180 MG TABLET    Take 1 tablet (180 mg total) by mouth daily.   GLUCOSAMINE-CHONDROIT-VIT C-MN (GLUCOSAMINE CHONDR 1500 COMPLX PO)  Take 1 capsule by mouth daily.   LORAZEPAM (ATIVAN) 1 MG TABLET    TAKE 1/2 TABLET BY MOUTH EVERY MORNING AND 1 TABLET EVERY NIGHT AT BEDTIME   MELATONIN 3 MG TABS    Take 2 tablets by mouth at bedtime.    MULTIPLE VITAMIN (MULTIVITAMIN) TABLET    Take 1 tablet by mouth daily.   NIACIN 100 MG TABLET    Take 250 mg by mouth daily with breakfast.    OMEGA-3 FATTY ACIDS (FISH OIL) 1000 MG CAPS    Take 1 capsule by mouth daily.   OVER THE COUNTER MEDICATION    Place 1 drop into both eyes every morning. Eyedrops   SIMVASTATIN (ZOCOR) 40 MG TABLET    TAKE 1 TABLET BY MOUTH AT BEDTIME FOR CHOLESTEROL   VITAMIN E 400 UNIT CAPSULE    Take 400 Units by mouth daily.  Modified Medications   No medications on file  Discontinued Medications   No  medications on file     Physical Exam:  Filed Vitals:   03/28/16 1321  BP: 132/86  Pulse: 79  Temp: 98.5 F (36.9 C)  TempSrc: Oral  Resp: 17  Height: 5\' 8"  (1.727 m)  Weight: 202 lb (91.627 kg)  SpO2: 93%   Body mass index is 30.72 kg/(m^2).  Physical Exam  Constitutional: He appears well-developed and well-nourished.  Genitourinary:  Penis with red patches noted, mild swelling to areas of redness  Skin: Skin is warm and dry.  Psychiatric: He has a normal mood and affect.    Labs reviewed: Basic Metabolic Panel:  Recent Labs  06/08/15 1259 06/21/15 0845  NA  --  142  K  --  4.4  CL  --  103  CO2  --  22  GLUCOSE  --  101*  BUN 24* 25  CREATININE 1.51* 1.56*  CALCIUM  --  9.2   Liver Function Tests:  Recent Labs  06/21/15 0845  AST 24  ALT 21  ALKPHOS 60  BILITOT 0.2  PROT 6.7  ALBUMIN 4.0   No results for input(s): LIPASE, AMYLASE in the last 8760 hours. No results for input(s): AMMONIA in the last 8760 hours. CBC:  Recent Labs  06/21/15 0845  WBC 6.3  NEUTROABS 3.6  HCT 38.3  MCV 93  PLT 162   Lipid Panel:  Recent Labs  06/21/15 0845  CHOL 156  HDL 47  LDLCALC 87  TRIG 112  CHOLHDL 3.3   TSH: No results for input(s): TSH in the last 8760 hours. A1C: No results found for: HGBA1C   Assessment/Plan 1. Skin yeast infection -to clean well with soap and water and make sure area is dry - nystatin (NYSTATIN) powder; Apply topically 3 (three) times daily.  Dispense: 15 g; Refill: 0  -to notify if symptoms fail to improve or worsen  Ketura Sirek K. Harle Battiest  [facility] [phone] 8 am - 5 pm) [phone] (after hours)

## 2016-03-28 NOTE — Patient Instructions (Signed)
Apply nystatin powder three times daily for 1 week then as needed to affected area

## 2016-03-29 ENCOUNTER — Encounter: Payer: Self-pay | Admitting: Podiatry

## 2016-03-29 ENCOUNTER — Ambulatory Visit (INDEPENDENT_AMBULATORY_CARE_PROVIDER_SITE_OTHER): Payer: Medicare Other | Admitting: Podiatry

## 2016-03-29 DIAGNOSIS — M79676 Pain in unspecified toe(s): Secondary | ICD-10-CM | POA: Diagnosis not present

## 2016-03-29 DIAGNOSIS — M79675 Pain in left toe(s): Secondary | ICD-10-CM | POA: Diagnosis not present

## 2016-03-29 DIAGNOSIS — B351 Tinea unguium: Secondary | ICD-10-CM

## 2016-03-29 NOTE — Progress Notes (Signed)
Patient ID: Andrew Vega, male   DOB: June 26, 1926, 82 y.o.   MRN: [id number] Complaint:  Visit Type: Patient returns to my office for continued preventative foot care services. Complaint: Patient states" my nails have grown long and thick and become painful to walk and wear shoes". He presents for preventative foot care services. No changes to ROS  Podiatric Exam: Vascular: dorsalis pedis and posterior tibial pulses are palpable bilateral. Capillary return is immediate. Temperature gradient is WNL. Skin turgor WNL  Sensorium: Normal Semmes Weinstein monofilament test. Normal tactile sensation bilaterally. Nail Exam: Pt has thick disfigured discolored nails with subungual debris noted bilateral entire nail hallux  Ulcer Exam: There is no evidence of ulcer or pre-ulcerative changes or infection. Orthopedic Exam: Muscle tone and strength are WNL. No limitations in general ROM. No crepitus or effusions noted. Foot type and digits show no abnormalities. Bony prominences are unremarkable. Skin: No Porokeratosis. No infection or ulcers.  Heloma durum fifth toe right foot  Diagnosis:  Tinea unguium, Pain in right toe, pain in left toes  Treatment & Plan Procedures and Treatment: Consent by patient was obtained for treatment procedures. The patient understood the discussion of treatment and procedures well. All questions were answered thoroughly reviewed. Debridement of mycotic and hypertrophic toenails, 1 through 5 bilateral and clearing of subungual debris. No ulceration, no infection noted.  Return Visit-Office Procedure: Patient instructed to return to the office for a follow up visit 3 months for continued evaluation and treatment.  Gardiner Barefoot DPM

## 2016-04-03 DIAGNOSIS — F418 Other specified anxiety disorders: Secondary | ICD-10-CM | POA: Diagnosis not present

## 2016-04-03 DIAGNOSIS — F329 Major depressive disorder, single episode, unspecified: Secondary | ICD-10-CM | POA: Diagnosis not present

## 2016-04-05 ENCOUNTER — Encounter: Payer: Medicare Other | Admitting: Physical Therapy

## 2016-04-06 ENCOUNTER — Ambulatory Visit: Payer: Medicare Other | Attending: Internal Medicine | Admitting: Physical Therapy

## 2016-04-06 DIAGNOSIS — M542 Cervicalgia: Secondary | ICD-10-CM

## 2016-04-06 DIAGNOSIS — M6281 Muscle weakness (generalized): Secondary | ICD-10-CM | POA: Diagnosis not present

## 2016-04-06 DIAGNOSIS — R293 Abnormal posture: Secondary | ICD-10-CM | POA: Diagnosis not present

## 2016-04-06 NOTE — Therapy (Signed)
Andrew Vega, [place], [address] Phone: [phone]   Fax:  [phone]  Physical Therapy Treatment  Patient Details  Name: Andrew Vega MRN: [id number] Date of Birth: [birth year] Referring Provider: Gayland Curry  Encounter Date: 04/06/2016      PT End of Session - 04/06/16 1103    Visit Number 9   Number of Visits 13   Date for PT Re-Evaluation 04/12/16   Authorization Type Medicare, KX at visit 15   PT Start Time 1025   PT Stop Time 1110   PT Time Calculation (min) 45 min   Activity Tolerance Patient tolerated treatment well   Behavior During Therapy [facility] for tasks assessed/performed      Past Medical History  Diagnosis Date  . Anxiety   . Hyperlipidemia   . Abdominal aneurysm without mention of rupture   . Insomnia, unspecified   . Colon polyps   . Arthritis   . Diverticulosis of colon (without mention of hemorrhage)   . COPD (chronic obstructive pulmonary disease) ([place])   . Inguinal hernia   . Anginal pain ([place])     recent hospitalization  . H/O hiatal hernia   . Cancer [facility]) Jan. 2017    skin cancer removal/ Right medial lower Leg    Past Surgical History  Procedure Laterality Date  . Vasectomy  1962  . Cholecystectomy  1962  . Colon surgery  2007  . Bilateral shoulder repair    . Nasal septum surgery  2011  . Hernia repair    . Abdominal aortic endovascular stent graft N/A 10/29/2013    Procedure: ABDOMINAL AORTIC ENDOVASCULAR STENT GRAFT;  Surgeon: [name], MD;  Location: [facility] OR;  Service: Vascular;  Laterality: N/A;  . Mohs surgery Right 11/2015    lower leg    There were no vitals filed for this visit.      Subjective Assessment - 04/06/16 1032    Subjective "the neck is still bothering me, I was doing my exercises and I felt some soreness in the neck"   Currently in Pain? Yes   Pain Score 7    Pain Location Neck   Pain Orientation Left   Pain Descriptors /  Indicators Aching;Sore   Pain Type Chronic pain   Pain Onset More than a month ago   Pain Frequency Intermittent   Aggravating Factors  looking to the left   Pain Relieving Factors avoid the motion                         [facility] Adult PT Treatment/Exercise - 04/06/16 0001    Moist Heat Therapy   Number Minutes Moist Heat 10 Minutes   Moist Heat Location Cervical;Shoulder   Manual Therapy   Manual therapy comments contract/ relax of L upper trap x 2: 30 sec hold with 10 sec contraction   Soft tissue mobilization IASTM over L upper trap   Myofascial Release rolling/ stretching of L upper trap   Neck Exercises: Stretches   Upper Trapezius Stretch 3 reps;30 seconds   Levator Stretch 2 reps;30 seconds          Trigger Point Dry Needling - 04/06/16 1037    Consent Given? Yes   Education Handout Provided No  given previously   Muscles Treated Upper Body Upper trapezius   Upper Trapezius Response Twitch reponse elicited;Palpable increased muscle length  x3  PT Education - 04/06/16 1102    Education provided Yes   Education Details reviewed HEP especially stretching to be performed correctly in order to avoid activating the upper trap.   performed concurrent during treatment   Person(s) Educated Patient   Methods Explanation;Verbal cues;Tactile cues   Comprehension Verbalized understanding;Verbal cues required;Tactile cues required          PT Short Term Goals - 03/22/16 1310    PT SHORT TERM GOAL #1   Title Patient will demonstrate appropriate posture and form independently with HEP as it is developed. by 02/29/16   Time 3   Period Weeks   Status Partially Met   PT SHORT TERM GOAL #2   Title Decreased pain to average of 4/10 by 02/29/16   Time 3   Period Weeks   Status Achieved           PT Long Term Goals - 03/22/16 1310    PT LONG TERM GOAL #1   Title Left cervical rotation to 60 deg actively without end range pain by 5/30    Time 6   Period Weeks   Status Partially Met   PT LONG TERM GOAL #2   Title Left cervical sidebend to 20 deg without end range pain by 5/30   Time 6   Period Weeks   Status Partially Met   PT LONG TERM GOAL #3   Title Bilateral shoulder external rotation MMT 4+/5 by 5/30   Time 6   Period Weeks   Status On-going   PT LONG TERM GOAL #4   Title average pain <2/10 by 5/30   Time 6   Period Weeks   Status On-going   PT LONG TERM GOAL #5   Title Pt will verbalize improved awareness of posture to avoid painful movements.    Time 6   Period Weeks   Status On-going   PT LONG TERM GOAL #6   Title FOTO to 38 by 5/30   Time 6   Period Weeks   Status Unable to assess               Plan - 04/06/16 1104    Clinical Impression Statement [name] reports having increased pain today in the neck rated at 6/10. upon further assessment he was performing his stretching with his arms abducted causing his upper traps to get overly activated and he was forcing the stretch. DN was performed on L upper trap x 3; which pt was was monitored during treatment. Following DN and STW pt reported pain dropped to 2/10. reviewed HEP for proper form . used MHP post session to calm down sorneess.    PT Next Visit Plan assess response to DN, goals, HEP, FOTO, D/C vs. Renewal?   PT Home Exercise Plan reviewed HEP   Consulted and Agree with Plan of Care Patient      Patient will benefit from skilled therapeutic intervention in order to improve the following deficits and impairments:  Decreased range of motion, Pain, Improper body mechanics, Decreased mobility, Decreased strength, Postural dysfunction  Visit Diagnosis: Cervicalgia  Muscle weakness (generalized)  Abnormal posture     Problem List Patient Active Problem List   Diagnosis Date Noted  . DDD (degenerative disc disease), cervical 03/09/2016  . Postural tremor 03/09/2016  . Endoleak post (EVAR) endovascular aneurysm repair ([place])  01/08/2015  . Abdominal aortic aneurysm ([place])   . Occlusion and stenosis of carotid artery without mention of cerebral infarction 06/01/2014  .  Fall in elderly patient 03/30/2014  . Loose stools 03/30/2014  . Closed fracture of proximal phalanx of right hand 03/30/2014  . Multiple fractures of ribs of left side 03/30/2014  . Chronic cough 01/13/2014  . Abdominal aneurysm without mention of rupture 12/01/2013  . Anxiety state 11/19/2013  . Hyperlipidemia 09/03/2013  . Insomnia 03/20/2013  . Allergic rhinitis 03/20/2013   Starr Lake PT, DPT, LAT, ATC  04/06/2016  11:14 AM    [facility] [address] [place], [place], [address] Phone: [phone]   Fax:  [phone]  Name: Andrew Vega MRN: [id number] Date of Birth: 11-19-1925

## 2016-04-12 ENCOUNTER — Ambulatory Visit: Payer: Medicare Other | Admitting: Physical Therapy

## 2016-04-12 DIAGNOSIS — M542 Cervicalgia: Secondary | ICD-10-CM | POA: Diagnosis not present

## 2016-04-12 DIAGNOSIS — F329 Major depressive disorder, single episode, unspecified: Secondary | ICD-10-CM | POA: Diagnosis not present

## 2016-04-12 DIAGNOSIS — M6281 Muscle weakness (generalized): Secondary | ICD-10-CM

## 2016-04-12 DIAGNOSIS — F418 Other specified anxiety disorders: Secondary | ICD-10-CM | POA: Diagnosis not present

## 2016-04-12 DIAGNOSIS — R293 Abnormal posture: Secondary | ICD-10-CM

## 2016-04-12 NOTE — Therapy (Signed)
[place], [place], [address] Phone: [phone]   Fax:  [phone]  Physical Therapy Treatment / Discharge Note  Patient Details  Name: Andrew Vega MRN: [id number] Date of Birth: Feb 03, 1926 Referring Provider: Gayland Curry  Encounter Date: 04/12/2016      PT End of Session - 04/12/16 1227    Visit Number 10   Number of Visits 13   Date for PT Re-Evaluation 04/12/16   Authorization Type Medicare, KX at visit 15   PT Start Time 1146   PT Stop Time 1231   PT Time Calculation (min) 45 min   Activity Tolerance Patient tolerated treatment well   Behavior During Therapy [facility] for tasks assessed/performed      Past Medical History  Diagnosis Date  . Anxiety   . Hyperlipidemia   . Abdominal aneurysm without mention of rupture   . Insomnia, unspecified   . Colon polyps   . Arthritis   . Diverticulosis of colon (without mention of hemorrhage)   . COPD (chronic obstructive pulmonary disease) ([place])   . Inguinal hernia   . Anginal pain ([place])     recent hospitalization  . H/O hiatal hernia   . Cancer [facility]) Jan. 2017    skin cancer removal/ Right medial lower Leg    Past Surgical History  Procedure Laterality Date  . Vasectomy  1962  . Cholecystectomy  1962  . Colon surgery  2007  . Bilateral shoulder repair    . Nasal septum surgery  2011  . Hernia repair    . Abdominal aortic endovascular stent graft N/A 10/29/2013    Procedure: ABDOMINAL AORTIC ENDOVASCULAR STENT GRAFT;  Surgeon: [name], MD;  Location: [facility] OR;  Service: Vascular;  Laterality: N/A;  . Mohs surgery Right 11/2015    lower leg    There were no vitals filed for this visit.      Subjective Assessment - 04/12/16 1148    Subjective "alittle more soreness today since the last session" the soreness starts at an earlier point when turning my head to the L    Currently in Pain? Yes   Pain Score 6    Pain Orientation Left   Pain Descriptors / Indicators Aching;Sore   Pain Type Chronic pain   Pain Onset More than a month ago   Pain Frequency Intermittent   Aggravating Factors  looking to the left   Pain Relieving Factors avoid the motion.             [facility] PT Assessment - 04/12/16 1216    Observation/Other Assessments   Focus on Therapeutic Outcomes (FOTO)  56% limited   AROM   Cervical Flexion 60   Cervical Extension 32  ERP   Cervical - Right Side Bend 43   Cervical - Left Side Bend 32   Cervical - Right Rotation 45   Cervical - Left Rotation 42  ERP                     [facility] Adult PT Treatment/Exercise - 04/12/16 0001    Self-Care   Self-Care Other Self-Care Comments   Other Self-Care Comments  manual trigger point release techniques using theracane which helped some    Manual Therapy   Manual therapy comments contract/ relax of L upper trap x 4: 30 sec hold with 10 sec contraction, manual trigger point release x 4 along L upper trap   Neck Exercises: Stretches  Upper Trapezius Stretch 5 reps;30 seconds                PT Education - April 30, 2016 1257    Education provided Yes   Education Details reviewed HEP and stretching with proper form as well as manual trigger point release so pt could work on getting relief at home. discussed at length pt progress and due to pain and lack of consistent progress after 10 visits plan to discharge him back to his physician.    Person(s) Educated Patient   Methods Explanation;Verbal cues;Demonstration   Comprehension Verbalized understanding;Returned demonstration          PT Short Term Goals - 2016/04/30 1226    PT SHORT TERM GOAL #1   Title Patient will demonstrate appropriate posture and form independently with HEP as it is developed. by 02/29/16   Time 3   Period Weeks   Status Achieved   PT SHORT TERM GOAL #2   Title Decreased pain to average of 4/10 by 02/29/16   Time 3   Period Weeks   Status Achieved           PT  Long Term Goals - 04/30/2016 1226    PT LONG TERM GOAL #1   Title Left cervical rotation to 60 deg actively without end range pain by 5/30   Time 6   Period Weeks   Status Partially Met   PT LONG TERM GOAL #2   Title Left cervical sidebend to 20 deg without end range pain by 5/30   Time 6   Period Weeks   Status Partially Met   PT LONG TERM GOAL #3   Title Bilateral shoulder external rotation MMT 4+/5 by 5/30   Time 6   Period Weeks   Status Partially Met   PT LONG TERM GOAL #4   Title average pain <2/10 by 5/30   Time 6   Period Weeks   Status Not Met   PT LONG TERM GOAL #5   Title Pt will verbalize improved awareness of posture to avoid painful movements.    Time 6   Period Weeks   Status Partially Met   PT LONG TERM GOAL #6   Title FOTO to 71 by 5/30   Time 6   Status Not Met               Plan - 04-30-2016 1259    Clinical Impression Statement Mr. Rodier has made some progress with physical therapy, however continues to report pain the in the L side of th eneck that seems to return a few days after treatment with little to no carry over. He partially met all LTG except for not meeting  #4, #6. due to lack progression and functional carry over from treatment over the course of 10 visits as well as pt lack of financial resources to be referred back to his MD,  pt will be discharged from physical therapy.    PT Next Visit Plan D/C   PT Home Exercise Plan reviewed HEP   Consulted and Agree with Plan of Care Patient      Patient will benefit from skilled therapeutic intervention in order to improve the following deficits and impairments:  Decreased range of motion, Pain, Improper body mechanics, Decreased mobility, Decreased strength, Postural dysfunction  Visit Diagnosis: Cervicalgia  Muscle weakness (generalized)  Abnormal posture       G-Codes - 04-30-2016 1303    Functional Assessment Tool Used FOTO / clnical judgement  Functional Limitation Other PT  primary   Other PT Primary Goal Status (K2706) At least 1 percent but less than 20 percent impaired, limited or restricted   Other PT Primary Discharge Status [phone]) At least 20 percent but less than 40 percent impaired, limited or restricted      Problem List Patient Active Problem List   Diagnosis Date Noted  . DDD (degenerative disc disease), cervical 03/09/2016  . Postural tremor 03/09/2016  . Endoleak post (EVAR) endovascular aneurysm repair ([place]) 01/08/2015  . Abdominal aortic aneurysm ([place])   . Occlusion and stenosis of carotid artery without mention of cerebral infarction 06/01/2014  . Fall in elderly patient 03/30/2014  . Loose stools 03/30/2014  . Closed fracture of proximal phalanx of right hand 03/30/2014  . Multiple fractures of ribs of left side 03/30/2014  . Chronic cough 01/13/2014  . Abdominal aneurysm without mention of rupture 12/01/2013  . Anxiety state 11/19/2013  . Hyperlipidemia 09/03/2013  . Insomnia 03/20/2013  . Allergic rhinitis 03/20/2013   Starr Lake PT, DPT, LAT, ATC  04/12/2016  1:05 PM      [place] [facility] [address] [place], [place], [address] Phone: [phone]   Fax:  [phone]  Name: Akshay Spang MRN: [id number] Date of Birth: Apr 06, 1926   PHYSICAL THERAPY DISCHARGE SUMMARY  Visits from Start of Care: 10  Current functional level related to goals / functional outcomes: FOTO 56% limited   Remaining deficits: Limited cervical mobility especially with L rotation with pain. Tightness of the L upper trap/ levator scapulae with palpable note in the L upper trap. Abnormal posture with forward head positioning and mild thoracic kyphosis requiring cues for posture. Pt required verbal cues for proper stretching and HEP techniques frequently.    Education / Equipment: HEP, posture education, theraband for strengthening  Plan: Patient agrees to discharge.  Patient goals were  partially met. Patient is being discharged due to lack of progress.  ?????

## 2016-04-19 DIAGNOSIS — F329 Major depressive disorder, single episode, unspecified: Secondary | ICD-10-CM | POA: Diagnosis not present

## 2016-04-19 DIAGNOSIS — F418 Other specified anxiety disorders: Secondary | ICD-10-CM | POA: Diagnosis not present

## 2016-04-22 ENCOUNTER — Other Ambulatory Visit: Payer: Self-pay | Admitting: Internal Medicine

## 2016-04-26 DIAGNOSIS — F329 Major depressive disorder, single episode, unspecified: Secondary | ICD-10-CM | POA: Diagnosis not present

## 2016-04-26 DIAGNOSIS — F418 Other specified anxiety disorders: Secondary | ICD-10-CM | POA: Diagnosis not present

## 2016-05-01 ENCOUNTER — Other Ambulatory Visit: Payer: Self-pay

## 2016-05-01 DIAGNOSIS — E785 Hyperlipidemia, unspecified: Secondary | ICD-10-CM

## 2016-05-01 DIAGNOSIS — R053 Chronic cough: Secondary | ICD-10-CM

## 2016-05-01 DIAGNOSIS — R05 Cough: Secondary | ICD-10-CM

## 2016-05-02 ENCOUNTER — Other Ambulatory Visit: Payer: Self-pay | Admitting: Internal Medicine

## 2016-05-03 ENCOUNTER — Other Ambulatory Visit: Payer: Medicare Other

## 2016-05-03 DIAGNOSIS — E785 Hyperlipidemia, unspecified: Secondary | ICD-10-CM

## 2016-05-03 DIAGNOSIS — R05 Cough: Secondary | ICD-10-CM | POA: Diagnosis not present

## 2016-05-03 DIAGNOSIS — F418 Other specified anxiety disorders: Secondary | ICD-10-CM | POA: Diagnosis not present

## 2016-05-03 DIAGNOSIS — R053 Chronic cough: Secondary | ICD-10-CM

## 2016-05-03 DIAGNOSIS — F329 Major depressive disorder, single episode, unspecified: Secondary | ICD-10-CM | POA: Diagnosis not present

## 2016-05-03 LAB — LIPID PANEL
Cholesterol: 140 mg/dL (ref 125–200)
HDL: 41 mg/dL (ref >=40)
LDL Cholesterol: 76 mg/dL (ref <130)
Total CHOL/HDL Ratio: 3.4 Ratio (ref <=5.0)
Triglycerides: 117 mg/dL (ref <150)
VLDL: 23 mg/dL (ref <30)

## 2016-05-03 LAB — CBC WITH DIFFERENTIAL/PLATELET
Basophils Absolute: 118 cells/uL (ref 0–200)
Basophils Relative: 2 %
Eosinophils Absolute: 413 cells/uL (ref 15–500)
Eosinophils Relative: 7 %
HCT: 37.9 % — ABNORMAL LOW (ref 38.5–50.0)
Hemoglobin: 12.8 g/dL — ABNORMAL LOW (ref 13.2–17.1)
Lymphocytes Relative: 28 %
Lymphs Abs: 1652 cells/uL (ref 850–3900)
MCH: 31.6 pg (ref 27.0–33.0)
MCHC: 33.8 g/dL (ref 32.0–36.0)
MCV: 93.6 fL (ref 80.0–100.0)
MPV: 10.8 fL (ref 7.5–12.5)
Monocytes Absolute: 649 cells/uL (ref 200–950)
Monocytes Relative: 11 %
Neutro Abs: 3068 cells/uL (ref 1500–7800)
Neutrophils Relative %: 52 %
Platelets: 156 10*3/uL (ref 140–400)
RBC: 4.05 MIL/uL — ABNORMAL LOW (ref 4.20–5.80)
RDW: 14.2 % (ref 11.0–15.0)
WBC: 5.9 10*3/uL (ref 3.8–10.8)

## 2016-05-03 LAB — COMPREHENSIVE METABOLIC PANEL
ALT: 22 U/L (ref 9–46)
AST: 24 U/L (ref 10–35)
Albumin: 3.9 g/dL (ref 3.6–5.1)
Alkaline Phosphatase: 64 U/L (ref 40–115)
BUN: 19 mg/dL (ref 7–25)
CO2: 25 mmol/L (ref 20–31)
Calcium: 8.6 mg/dL (ref 8.6–10.3)
Chloride: 107 mmol/L (ref 98–110)
Creat: 1.56 mg/dL — ABNORMAL HIGH (ref 0.70–1.11)
Glucose, Bld: 91 mg/dL (ref 65–99)
Potassium: 4.2 mmol/L (ref 3.5–5.3)
Sodium: 141 mmol/L (ref 135–146)
Total Bilirubin: 0.4 mg/dL (ref 0.2–1.2)
Total Protein: 6.8 g/dL (ref 6.1–8.1)

## 2016-05-08 ENCOUNTER — Encounter: Payer: Self-pay | Admitting: Internal Medicine

## 2016-05-08 ENCOUNTER — Ambulatory Visit (INDEPENDENT_AMBULATORY_CARE_PROVIDER_SITE_OTHER): Payer: Medicare Other | Admitting: Internal Medicine

## 2016-05-08 VITALS — BP 138/70 | HR 73 | Temp 98.3°F | Wt 204.0 lb

## 2016-05-08 DIAGNOSIS — B3742 Candidal balanitis: Secondary | ICD-10-CM

## 2016-05-08 DIAGNOSIS — G252 Other specified forms of tremor: Secondary | ICD-10-CM

## 2016-05-08 DIAGNOSIS — G4700 Insomnia, unspecified: Secondary | ICD-10-CM | POA: Diagnosis not present

## 2016-05-08 DIAGNOSIS — R238 Other skin changes: Secondary | ICD-10-CM | POA: Diagnosis not present

## 2016-05-08 DIAGNOSIS — D649 Anemia, unspecified: Secondary | ICD-10-CM | POA: Insufficient documentation

## 2016-05-08 DIAGNOSIS — F411 Generalized anxiety disorder: Secondary | ICD-10-CM

## 2016-05-08 DIAGNOSIS — E785 Hyperlipidemia, unspecified: Secondary | ICD-10-CM | POA: Diagnosis not present

## 2016-05-08 DIAGNOSIS — M5030 Other cervical disc degeneration, unspecified cervical region: Secondary | ICD-10-CM | POA: Diagnosis not present

## 2016-05-08 DIAGNOSIS — I6523 Occlusion and stenosis of bilateral carotid arteries: Secondary | ICD-10-CM | POA: Diagnosis not present

## 2016-05-08 MED ORDER — NYSTATIN 100000 UNIT/GM EX CREA: 1.0000 "application " | TOPICAL_CREAM | Freq: Two times a day (BID) | CUTANEOUS | Status: DC

## 2016-05-08 MED ORDER — CLOTRIMAZOLE 1 % EX OINT: 1.0000 "application " | TOPICAL_OINTMENT | Freq: Two times a day (BID) | CUTANEOUS | Status: DC

## 2016-05-08 NOTE — Progress Notes (Signed)
Location:  [facility] clinic Provider:  Lorry Anastasi L. Mariea Clonts, D.O., C.M.D.  Code Status: DNR Goals of Care:  Advanced Directives 05/08/2016  Does patient have an advance directive? Yes  Type of Advance Directive Healthcare Power of Attorney  Copy of advanced directive(s) in chart? -     Chief Complaint  Patient presents with  . Medical Management of Chronic Issues    3 mth follow-up    HPI: Patient is a 82 y.o. male seen today for medical management of chronic diseases.    Says triamcinolone did not help his left ear--was getting blisters and peeling on it.    Was having discoloration on his penis.  Using nystatin powder for this.  It worked temporarily.  Somewhat improved maybe, but not cleared up.    Cervical DDD:  After PT treatments, felt good for a couple of days, then would return.  Manipulative things helped for a while.  Got temporary relief also from manual trigger point release.  He is concentrating on his posture.    Mild anemia:  Reports eating habits have worsened lately.  Looking back, he's trended down over the past year to 12.8 now.  Insomnia:  Has trouble going back to sleep after he gets up to urinate.    Hyperlipidemia:  LDL well controlled on zocor.    Depression:  Was off citalopram temporarily.  He had stopped it on his own.  But, then renewed it and started taking it this week again.  Discussed that this may actually help his sleep so we decided to continue on it.   He is considering a move for himself if Manuela Schwartz gets to where she does not recognize him.  He feels anxious when Manuela Schwartz looks peaceful and helpless at night.  He is seeing a counselor to adjust to this.      Past Medical History  Diagnosis Date  . Anxiety   . Hyperlipidemia   . Abdominal aneurysm without mention of rupture   . Insomnia, unspecified   . Colon polyps   . Arthritis   . Diverticulosis of colon (without mention of hemorrhage)   . COPD (chronic obstructive pulmonary disease) ([place])   .  Inguinal hernia   . Anginal pain ([place])     recent hospitalization  . H/O hiatal hernia   . Cancer [facility]) Jan. 2017    skin cancer removal/ Right medial lower Leg    Past Surgical History  Procedure Laterality Date  . Vasectomy  1962  . Cholecystectomy  1962  . Colon surgery  2007  . Bilateral shoulder repair    . Nasal septum surgery  2011  . Hernia repair    . Abdominal aortic endovascular stent graft N/A 10/29/2013    Procedure: ABDOMINAL AORTIC ENDOVASCULAR STENT GRAFT;  Surgeon: [name], MD;  Location: [facility] OR;  Service: Vascular;  Laterality: N/A;  . Mohs surgery Right 11/2015    lower leg    Allergies  Allergen Reactions  . Celebrex [Celecoxib] Other (See Comments)    Raised B/P   . Codeine Other (See Comments)    Severe stomach pain       Medication List       This list is accurate as of: 05/08/16 10:58 AM.  Always use your most recent med list.               aspirin 81 MG tablet  Take 81 mg by mouth daily.     CHLOR-TRIMETON 4 MG tablet  Generic drug:  chlorpheniramine  Take 4-8 mg by mouth every 6 (six) hours as needed for allergies.     citalopram 40 MG tablet  Commonly known as:  CELEXA  TAKE 1 TABLET BY MOUTH EVERY DAY.     fexofenadine 180 MG tablet  Commonly known as:  ALLEGRA  Take 1 tablet (180 mg total) by mouth daily.     Fish Oil 1000 MG Caps  Take 1 capsule by mouth daily.     GLUCOSAMINE CHONDR 1500 COMPLX PO  Take 1 capsule by mouth daily.     LORazepam 1 MG tablet  Commonly known as:  ATIVAN  TAKE 1/2 TABLET BY MOUTH EVERY MORNING AND 1 EVERY NIGHT AT BEDTIME     Melatonin 3 MG Tabs  Take 2 tablets by mouth at bedtime.     multivitamin tablet  Take 1 tablet by mouth daily.     niacin 100 MG tablet  Take 250 mg by mouth daily with breakfast.     nystatin powder  Commonly known as:  nystatin  Apply topically 3 (three) times daily.     OVER THE COUNTER MEDICATION  Place 1 drop into both eyes every morning. Eyedrops      simvastatin 40 MG tablet  Commonly known as:  ZOCOR  TAKE 1 TABLET BY MOUTH AT BEDTIME FOR CHOLESTEROL     SUPER B COMPLEX PO  Take 1 tablet by mouth daily.     triamcinolone cream 0.1 %  Commonly known as:  KENALOG  Apply 1 application topically 2 (two) times daily.     Vitamin D 2000 units Caps  Take 2,000 Units by mouth daily.     vitamin E 400 UNIT capsule  Take 400 Units by mouth daily.        Review of Systems:  Review of Systems  Constitutional: Negative for fever, chills and malaise/fatigue.  HENT: Positive for hearing loss.   Eyes: Negative for blurred vision.  Respiratory: Negative for cough and shortness of breath.   Cardiovascular: Negative for chest pain, palpitations and leg swelling.  Gastrointestinal: Negative for abdominal pain.  Genitourinary: Negative for dysuria, urgency and frequency.  Musculoskeletal: Negative for falls.  Skin: Positive for itching and rash.       Left ear with vesicles and penis with film and erythema  Neurological: Negative for dizziness, loss of consciousness and weakness.  Psychiatric/Behavioral: Positive for depression and memory loss. The patient is nervous/anxious.     Health Maintenance  Topic Date Due  . ZOSTAVAX  06/23/2016 (Originally 08/07/1986)  . INFLUENZA VACCINE  05/23/2016  . TETANUS/TDAP  10/23/2021  . PNA vac Low Risk Adult  Completed    Physical Exam: Filed Vitals:   05/08/16 1042  BP: 138/70  Pulse: 73  Temp: 98.3 F (36.8 C)  TempSrc: Oral  Weight: 204 lb (92.534 kg)  SpO2: 96%   Body mass index is 31.03 kg/(m^2). Physical Exam  Constitutional: He is oriented to person, place, and time. He appears well-developed and well-nourished. No distress.  Cardiovascular: Normal rate, regular rhythm, normal heart sounds and intact distal pulses.   Pulmonary/Chest: Effort normal and breath sounds normal.  Musculoskeletal: Normal range of motion.  Postural tremor left greater than right  Neurological: He  is alert and oriented to person, place, and time.  Skin:  Left external ear with vesicular rash, dry scaly appearance with erythema; also has erythema which appears dry of his penis and scrotum--both nonpainful and nonpruritic  Psychiatric:  anxious  Labs reviewed: Basic Metabolic Panel:  Recent Labs  06/08/15 1259 06/21/15 0845 05/03/16 0832  NA  --  142 141  K  --  4.4 4.2  CL  --  103 107  CO2  --  22 25  GLUCOSE  --  101* 91  BUN 24* 25 19  CREATININE 1.51* 1.56* 1.56*  CALCIUM  --  9.2 8.6   Liver Function Tests:  Recent Labs  06/21/15 0845 05/03/16 0832  AST 24 24  ALT 21 22  ALKPHOS 60 64  BILITOT 0.2 0.4  PROT 6.7 6.8  ALBUMIN 4.0 3.9   No results for input(s): LIPASE, AMYLASE in the last 8760 hours. No results for input(s): AMMONIA in the last 8760 hours. CBC:  Recent Labs  06/21/15 0845 05/03/16 0832  WBC 6.3 5.9  NEUTROABS 3.6 3068  HGB  --  12.8*  HCT 38.3 37.9*  MCV 93 93.6  PLT 162 156   Lipid Panel:  Recent Labs  06/21/15 0845 05/03/16 0832  CHOL 156 140  HDL 47 41  LDLCALC 87 76  TRIG 112 117  CHOLHDL 3.3 3.4   Assessment/Plan 1. DDD (degenerative disc disease), cervical -somewhat improved with therapy--reports transient improvement, then return, but still better than it was pre-PT -discussed that chronic condition may lead to occasional flares requiring repeat treatments  2. Insomnia -ongoing, seems anxiety induced and not helped by his nocturia from BPH -see anxiety below  3. Hyperlipidemia -lipids great with zocor, cont same  4. Anxiety state -advised to continue counseling, citalopram and prn lorazepam -not to stop citalopram w/o discussing with me first (would explain worsening anxiety past several months b/c he was not taking it by his own accord)  5. Postural tremor -seems his right arm now is starting also (as cervical DDD worsens)  6. Anemia, unspecified anemia type -mild, new -will recheck day of appt at  annual  7. Rash, vesicular - unclear etiology--did not respond to triamcinolone and sleeps on that side at night (does not appear to be pressure though and nonpainful to go against zoster, plus present for several months now) - try Clotrimazole 1 % OINT; Apply 1 application topically 2 (two) times daily. To left ear until resolved  Dispense: 56.7 g; Refill: 1  8. Candidiasis of penis - he seems to be saying he had some improvement with then nystop powder, but area appears dry so suspect it's not adhering well enough to stay on and treat the yeast--will try cream instead - nystatin cream (MYCOSTATIN); Apply 1 application topically 2 (two) times daily.  Dispense: 30 g; Refill: 1  Labs/tests ordered:  No orders of the defined types were placed in this encounter.    Next appt:  08/10/2016 for annual w/ MMSE--cbc day of appt  Sira Adsit L. Luccia Reinheimer, D.O. [facility] [address], [place] [address] Cell Phone (Mon-Fri 8am-5pm):  [phone] On Call:  [phone] & follow prompts after 5pm & weekends Office Phone:  [phone] Office Fax:  [phone]

## 2016-05-23 ENCOUNTER — Other Ambulatory Visit: Payer: Self-pay | Admitting: *Deleted

## 2016-05-23 ENCOUNTER — Other Ambulatory Visit: Payer: Self-pay | Admitting: Internal Medicine

## 2016-05-23 DIAGNOSIS — E785 Hyperlipidemia, unspecified: Secondary | ICD-10-CM

## 2016-05-23 MED ORDER — LORAZEPAM 1 MG PO TABS: ORAL_TABLET | ORAL | 0 refills | Status: DC

## 2016-05-23 NOTE — Telephone Encounter (Signed)
Patient requested because is going out of town and needs refill to get through.

## 2016-06-01 ENCOUNTER — Telehealth: Payer: Self-pay | Admitting: *Deleted

## 2016-06-01 NOTE — Telephone Encounter (Signed)
Patient is in [place] visiting with his son for the week and forgot his Ativan at home. Would like a temporary supply called to [facility] in [place]. Called in Temporary supply of #12 to [facility] [phone]

## 2016-06-12 DIAGNOSIS — F418 Other specified anxiety disorders: Secondary | ICD-10-CM | POA: Diagnosis not present

## 2016-06-12 DIAGNOSIS — F329 Major depressive disorder, single episode, unspecified: Secondary | ICD-10-CM | POA: Diagnosis not present

## 2016-06-16 ENCOUNTER — Other Ambulatory Visit (HOSPITAL_COMMUNITY): Payer: Medicare Other

## 2016-06-16 ENCOUNTER — Ambulatory Visit: Payer: Medicare Other | Admitting: Family

## 2016-06-16 ENCOUNTER — Encounter: Payer: Self-pay | Admitting: Vascular Surgery

## 2016-06-16 ENCOUNTER — Encounter (HOSPITAL_COMMUNITY): Payer: Medicare Other

## 2016-06-21 ENCOUNTER — Encounter: Payer: Self-pay | Admitting: Family

## 2016-06-21 ENCOUNTER — Ambulatory Visit (INDEPENDENT_AMBULATORY_CARE_PROVIDER_SITE_OTHER)
Admission: RE | Admit: 2016-06-21 | Discharge: 2016-06-21 | Disposition: A | Discharge status: Home / Self Care | Payer: Medicare Other | Source: Ambulatory Visit | Attending: Family | Admitting: Family

## 2016-06-21 ENCOUNTER — Ambulatory Visit (HOSPITAL_COMMUNITY)
Admission: RE | Admit: 2016-06-21 | Discharge: 2016-06-21 | Disposition: A | Discharge status: Home / Self Care | Payer: Medicare Other | Source: Ambulatory Visit | Attending: Family | Admitting: Family

## 2016-06-21 ENCOUNTER — Ambulatory Visit (INDEPENDENT_AMBULATORY_CARE_PROVIDER_SITE_OTHER): Payer: Medicare Other | Admitting: Family

## 2016-06-21 VITALS — BP 143/86 | HR 82 | Temp 98.2°F | Resp 20 | Ht 68.0 in | Wt 199.0 lb

## 2016-06-21 DIAGNOSIS — I714 Abdominal aortic aneurysm, without rupture, unspecified: Secondary | ICD-10-CM

## 2016-06-21 DIAGNOSIS — IMO0001 Reserved for inherently not codable concepts without codable children: Secondary | ICD-10-CM

## 2016-06-21 DIAGNOSIS — Z48812 Encounter for surgical aftercare following surgery on the circulatory system: Secondary | ICD-10-CM

## 2016-06-21 DIAGNOSIS — T82330D Leakage of aortic (bifurcation) graft (replacement), subsequent encounter: Secondary | ICD-10-CM

## 2016-06-21 DIAGNOSIS — I6523 Occlusion and stenosis of bilateral carotid arteries: Secondary | ICD-10-CM | POA: Insufficient documentation

## 2016-06-21 LAB — VAS US CAROTID
LCCADSYS: -86 cm/s
LCCAPDIAS: 12 cm/s
LCCAPSYS: 98 cm/s
Left CCA dist dias: -10 cm/s
Left ICA dist dias: -14 cm/s
Left ICA dist sys: -60 cm/s
Left ICA prox dias: 11 cm/s
Left ICA prox sys: 81 cm/s
RCCAPDIAS: 8 cm/s
RCCAPSYS: 73 cm/s
RIGHT CCA MID DIAS: 10 cm/s
Right cca dist sys: -64 cm/s

## 2016-06-21 NOTE — Patient Instructions (Signed)
Stroke Prevention Some medical conditions and behaviors are associated with an increased chance of having a stroke. You may prevent a stroke by making healthy choices and managing medical conditions. HOW CAN I REDUCE MY RISK OF HAVING A STROKE?   Stay physically active. Get at least 30 minutes of activity on most or all days.  Do not smoke. It may also be helpful to avoid exposure to secondhand smoke.  Limit alcohol use. Moderate alcohol use is considered to be:  No more than 2 drinks per day for men.  No more than 1 drink per day for nonpregnant women.  Eat healthy foods. This involves:  Eating 5 or more servings of fruits and vegetables a day.  Making dietary changes that address high blood pressure (hypertension), high cholesterol, diabetes, or obesity.  Manage your cholesterol levels.  Making food choices that are high in fiber and low in saturated fat, trans fat, and cholesterol may control cholesterol levels.  Take any prescribed medicines to control cholesterol as directed by your health care provider.  Manage your diabetes.  Controlling your carbohydrate and sugar intake is recommended to manage diabetes.  Take any prescribed medicines to control diabetes as directed by your health care provider.  Control your hypertension.  Making food choices that are low in salt (sodium), saturated fat, trans fat, and cholesterol is recommended to manage hypertension.  Ask your health care provider if you need treatment to lower your blood pressure. Take any prescribed medicines to control hypertension as directed by your health care provider.  If you are 18-39 years of age, have your blood pressure checked every 3-5 years. If you are 40 years of age or older, have your blood pressure checked every year.  Maintain a healthy weight.  Reducing calorie intake and making food choices that are low in sodium, saturated fat, trans fat, and cholesterol are recommended to manage  weight.  Stop drug abuse.  Avoid taking birth control pills.  Talk to your health care provider about the risks of taking birth control pills if you are over 35 years old, smoke, get migraines, or have ever had a blood clot.  Get evaluated for sleep disorders (sleep apnea).  Talk to your health care provider about getting a sleep evaluation if you snore a lot or have excessive sleepiness.  Take medicines only as directed by your health care provider.  For some people, aspirin or blood thinners (anticoagulants) are helpful in reducing the risk of forming abnormal blood clots that can lead to stroke. If you have the irregular heart rhythm of atrial fibrillation, you should be on a blood thinner unless there is a good reason you cannot take them.  Understand all your medicine instructions.  Make sure that other conditions (such as anemia or atherosclerosis) are addressed. SEEK IMMEDIATE MEDICAL CARE IF:   You have sudden weakness or numbness of the face, arm, or leg, especially on one side of the body.  Your face or eyelid droops to one side.  You have sudden confusion.  You have trouble speaking (aphasia) or understanding.  You have sudden trouble seeing in one or both eyes.  You have sudden trouble walking.  You have dizziness.  You have a loss of balance or coordination.  You have a sudden, severe headache with no known cause.  You have new chest pain or an irregular heartbeat. Any of these symptoms may represent a serious problem that is an emergency. Do not wait to see if the symptoms will   go away. Get medical help at once. Call your local emergency services (911 in [place] Do not drive yourself to the hospital.   This information is not intended to replace advice given to you by your health care provider. Make sure you discuss any questions you have with your health care provider.   Document Released: 11/16/2004 Document Revised: 10/30/2014 Document Reviewed:  04/11/2013 Elsevier Interactive Patient Education [employer].    Before your next abdominal ultrasound:  Take two Extra-Strength Gas-X capsules at bedtime the night before the test. Take another two Extra-Strength Gas-X capsules 3 hours before the test.

## 2016-06-21 NOTE — Progress Notes (Signed)
[facility]   CC: Follow up s/p EVAR  History of Present Illness  Andrew Vega is a 82 y.o. (09/29/1926) male patient of Dr. Trula Slade who is back today for postoperative EVAR surveillance. 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 Maximum aortic size was 5.3 cm. Dr. Trula Slade placed a proximal extension for a type I endoleak. This was originally scheduled to be performed by Dr. Kellie Simmering, however he had a family emergency and therefore Dr. Trula Slade did the procedure. The patient's postoperative course was uncomplicated.   He had a type II endoleak which was associated with aneurysm sac expansion. Therefore he underwent embolization in March 2016. He is here for follow-up of his abdominal aneurysm as well as his carotid occlusive disease which is asymptomatic.  Pt denies any history of stroke or TIA.  He denies claudication sx's with walking.   Pt Diabetic: No Pt smoker: former smoker, quit in 1986   Past Medical History:  Diagnosis Date  . Abdominal aneurysm without mention of rupture   . Anginal pain ([place])    recent hospitalization  . Anxiety   . Arthritis   . Cancer [facility]) Jan. 2017   skin cancer removal/ Right medial lower Leg  . Colon polyps   . COPD (chronic obstructive pulmonary disease) ([place])   . Diverticulosis of colon (without mention of hemorrhage)   . H/O hiatal hernia   . Hyperlipidemia   . Inguinal hernia   . Insomnia, unspecified    Past Surgical History:  Procedure Laterality Date  . ABDOMINAL AORTIC ENDOVASCULAR STENT GRAFT N/A 10/29/2013   Procedure: ABDOMINAL AORTIC ENDOVASCULAR STENT GRAFT;  Surgeon: [name], MD;  Location: [place];  Service: Vascular;  Laterality: N/A;  . bilateral shoulder repair    . CHOLECYSTECTOMY  1962  . COLON SURGERY  2007  . HERNIA REPAIR    . MOHS SURGERY Right 11/2015   lower leg  . NASAL SEPTUM SURGERY  2011  . [place]  History Social History  Substance Use Topics  . Smoking status: Former Smoker    Packs/day: 0.75    Years: 25.00    Types: Cigarettes    Quit date: 10/23/1984  . Smokeless tobacco: Never Used  . Alcohol use 0.6 oz/week    1 Glasses of wine per week     Comment: social   Family History Family History  Problem Relation Age of Onset  . Colon cancer Neg Hx   . Heart attack Father 65   Current Outpatient Prescriptions on File Prior to Visit  Medication Sig Dispense Refill  . aspirin 81 MG tablet Take 81 mg by mouth daily.    . B Complex-C (SUPER B COMPLEX PO) Take 1 tablet by mouth daily.     . chlorpheniramine (CHLOR-TRIMETON) 4 MG tablet Take 4-8 mg by mouth every 6 (six) hours as needed for allergies.     . Cholecalciferol (VITAMIN D) 2000 UNITS CAPS Take 2,000 Units by mouth daily.     . Clotrimazole 1 % OINT Apply 1 application topically 2 (two) times daily. To left ear until resolved 56.7 g 1  . fexofenadine (ALLEGRA) 180 MG tablet Take 1 tablet (180 mg total) by mouth daily. 30 tablet 0  . Glucosamine-Chondroit-Vit C-Mn (GLUCOSAMINE CHONDR 1500 COMPLX PO) Take 1 capsule by mouth daily.    Marland Kitchen LORazepam (ATIVAN) 1 MG tablet Take 0.5 tablet by mouth every morning and take 1 tablet by mouth  at bedtime 45 tablet 0  . Melatonin 3 MG TABS Take 2 tablets by mouth at bedtime.     . Multiple Vitamin (MULTIVITAMIN) tablet Take 1 tablet by mouth daily.    . niacin 100 MG tablet Take 250 mg by mouth daily with breakfast.     . nystatin cream (MYCOSTATIN) Apply 1 application topically 2 (two) times daily. 30 g 1  . Omega-3 Fatty Acids (FISH OIL) 1000 MG CAPS Take 1 capsule by mouth daily.    Marland Kitchen OVER THE COUNTER MEDICATION Place 1 drop into both eyes every morning. Eyedrops    . simvastatin (ZOCOR) 40 MG tablet TAKE 1 TABLET BY MOUTH AT BEDTIME FOR CHOLESTEROL 90 tablet 1  . vitamin E 400 UNIT capsule Take 400 Units by mouth daily.    . citalopram (CELEXA) 40 MG tablet TAKE 1 TABLET BY MOUTH  EVERY DAY. (Patient not taking: Reported on 06/21/2016) 90 tablet 1   No current facility-administered medications on file prior to visit.    Allergies  Allergen Reactions  . Celebrex [Celecoxib] Other (See Comments)    Raised B/P   . Codeine Other (See Comments)    Severe stomach pain      ROS: See HPI for pertinent positives and negatives.  Physical Examination  Vitals:   06/21/16 1027 06/21/16 1029  BP: (!) 140/93 (!) 143/86  Pulse: 82   Resp: 20   Temp: 98.2 F (36.8 C)   TempSrc: Oral   SpO2: 96%   Weight: 199 lb (90.3 kg)   Height: 5\' 8"  (1.727 m)    Body mass index is 30.26 kg/m.  General: A&O x 3, WD, obese male.  Pulmonary: Sym exp, good air movt, CTAB except fines rales in both bases, non labored respirations  Cardiac: RRR, Nl S1, S2, + aortic murmur   Vascular: Vessel Right Left  Radial Palpable Palpable  Brachial Palpable Palpable  Carotid  without bruit  without bruit  Aorta Not palpable N/A  Femoral Palpable Palpable  Popliteal Not palpable Not palpable  PT Palpable Palpable  DP Palpable Palpable   Gastrointestinal: soft, NTND, -G/R, - HSM, - palpable masses, - CVAT B.  Musculoskeletal: M/S 5/5 throughout, extremities without ischemic changes.  Neurologic: Pain and light touch intact in extremities, Motor exam as listed above. CN 2-12 intact except for hard of hearing.   CTA Abd/Pelvis Duplex (Date: 06/09/15): IMPRESSION: The previously seen type 2 endoleak has been resolved after from below therapy. Maximal sac diameter has diminished from 5.9 cm to 5.5 cm. Bilateral common iliac artery aneurysm sacs remained excluded and without endoleak. They are not significantly changed.   Non-Invasive Vascular Imaging  EVAR Duplex (Date: 06/21/16)  AAA sac size: 5.8 cm x 5.6 cm  Right CIA: 1.7 cm  Left CIA: 1.8 cm  no endoleak detected Decreased visualization of the abdominal vasculature due to overlying bowel gas.    Medical  Decision Making  Andrew Vega is a 82 y.o. male who presents s/p EVAR (Date 10/29/2013). He had a type II endoleak which was associated with aneurysm sac expansion. Therefore he underwent embolization in March 2016.   Pt is asymptomatic with stable or decreased sac size compared to CTA of 06/09/15.   He has no hx of stroke or TIA.   Today's carotid duplex suggests 1-39% stenosis of the bilateral ICA. The left ICA appears less stenotic than the previous duplex on 12/13/15 with the right ICA remaining stable.    I discussed with the patient  the importance of surveillance of the endograft.  The next endograft duplex will be scheduled for 6 months.        Carotid duplex in 1 year.  The patient will follow up with [place] in 6 months with these studies.  I emphasized the importance of maximal medical management including strict control of blood pressure, blood glucose, and lipid levels, antiplatelet agents, obtaining regular exercise, and cessation of smoking.   Thank you for allowing [place] to participate in this patient's care.  Clemon Chambers, RN, MSN, FNP-C [facility] [place] Office: [phone]  Clinic Physician: Scot Dock  06/21/2016, 10:59 AM

## 2016-06-22 ENCOUNTER — Other Ambulatory Visit: Payer: Self-pay | Admitting: Family

## 2016-06-22 DIAGNOSIS — I714 Abdominal aortic aneurysm, without rupture, unspecified: Secondary | ICD-10-CM

## 2016-06-28 ENCOUNTER — Encounter: Payer: Self-pay | Admitting: Podiatry

## 2016-06-28 ENCOUNTER — Ambulatory Visit (INDEPENDENT_AMBULATORY_CARE_PROVIDER_SITE_OTHER): Payer: Medicare Other | Admitting: Podiatry

## 2016-06-28 DIAGNOSIS — F329 Major depressive disorder, single episode, unspecified: Secondary | ICD-10-CM | POA: Diagnosis not present

## 2016-06-28 DIAGNOSIS — M79675 Pain in left toe(s): Secondary | ICD-10-CM | POA: Diagnosis not present

## 2016-06-28 DIAGNOSIS — F418 Other specified anxiety disorders: Secondary | ICD-10-CM | POA: Diagnosis not present

## 2016-06-28 DIAGNOSIS — M79676 Pain in unspecified toe(s): Secondary | ICD-10-CM

## 2016-06-28 DIAGNOSIS — B351 Tinea unguium: Secondary | ICD-10-CM

## 2016-06-28 NOTE — Progress Notes (Signed)
Patient ID: Andrew Vega, male   DOB: 1926/08/20, 82 y.o.   MRN: [id number] Complaint:  Visit Type: Patient returns to my office for continued preventative foot care services. Complaint: Patient states" my nails have grown long and thick and become painful to walk and wear shoes". He presents for preventative foot care services. No changes to ROS  Podiatric Exam: Vascular: dorsalis pedis and posterior tibial pulses are palpable bilateral. Capillary return is immediate. Temperature gradient is WNL. Skin turgor WNL  Sensorium: Normal Semmes Weinstein monofilament test. Normal tactile sensation bilaterally. Nail Exam: Pt has thick disfigured discolored nails with subungual debris noted bilateral entire nail hallux  Ulcer Exam: There is no evidence of ulcer or pre-ulcerative changes or infection. Orthopedic Exam: Muscle tone and strength are WNL. No limitations in general ROM. No crepitus or effusions noted. Foot type and digits show no abnormalities. Bony prominences are unremarkable. Skin: No Porokeratosis. No infection or ulcers.  Heloma durum fifth toe right foot asymptomatic.  Diagnosis:  Tinea unguium, Pain in right toe, pain in left toes  Treatment & Plan Procedures and Treatment: Consent by patient was obtained for treatment procedures. The patient understood the discussion of treatment and procedures well. All questions were answered thoroughly reviewed. Debridement of mycotic and hypertrophic toenails, 1 through 5 bilateral and clearing of subungual debris. No ulceration, no infection noted.  Return Visit-Office Procedure: Patient instructed to return to the office for a follow up visit 3 months for continued evaluation and treatment.  Gardiner Barefoot DPM

## 2016-07-20 ENCOUNTER — Other Ambulatory Visit: Payer: Self-pay | Admitting: Radiology

## 2016-07-20 ENCOUNTER — Other Ambulatory Visit (HOSPITAL_COMMUNITY): Payer: Self-pay | Admitting: Interventional Radiology

## 2016-07-20 DIAGNOSIS — IMO0001 Reserved for inherently not codable concepts without codable children: Secondary | ICD-10-CM

## 2016-07-20 DIAGNOSIS — T82330D Leakage of aortic (bifurcation) graft (replacement), subsequent encounter: Secondary | ICD-10-CM

## 2016-07-25 DIAGNOSIS — Z23 Encounter for immunization: Secondary | ICD-10-CM | POA: Diagnosis not present

## 2016-07-27 ENCOUNTER — Other Ambulatory Visit: Payer: Self-pay

## 2016-07-27 DIAGNOSIS — Z23 Encounter for immunization: Secondary | ICD-10-CM | POA: Diagnosis not present

## 2016-07-27 MED ORDER — LORAZEPAM 1 MG PO TABS: ORAL_TABLET | ORAL | 0 refills | Status: DC

## 2016-07-28 ENCOUNTER — Telehealth: Payer: Self-pay | Admitting: Internal Medicine

## 2016-07-28 NOTE — Telephone Encounter (Signed)
Patient returned call. Scheduled appointment on nurse visit scheduled for 1:45 pm, patient will arrive at 1:00 pm for Annual wellness

## 2016-07-28 NOTE — Telephone Encounter (Signed)
left voicemail msg asking Andrew Vega if he could come at 1:00 on 10/19 to meet with nurse for AWV, then see Dr. Mariea Clonts afterwards. VDM (dee-dee)

## 2016-08-10 ENCOUNTER — Encounter: Payer: Self-pay | Admitting: Internal Medicine

## 2016-08-10 ENCOUNTER — Ambulatory Visit: Payer: Medicare Other

## 2016-08-10 ENCOUNTER — Ambulatory Visit (INDEPENDENT_AMBULATORY_CARE_PROVIDER_SITE_OTHER): Payer: Medicare Other | Admitting: Internal Medicine

## 2016-08-10 VITALS — BP 138/80 | HR 78 | Temp 98.2°F | Ht 68.0 in | Wt 198.0 lb

## 2016-08-10 DIAGNOSIS — Z6830 Body mass index (BMI) 30.0-30.9, adult: Secondary | ICD-10-CM | POA: Diagnosis not present

## 2016-08-10 DIAGNOSIS — F411 Generalized anxiety disorder: Secondary | ICD-10-CM | POA: Diagnosis not present

## 2016-08-10 DIAGNOSIS — E785 Hyperlipidemia, unspecified: Secondary | ICD-10-CM

## 2016-08-10 DIAGNOSIS — I6523 Occlusion and stenosis of bilateral carotid arteries: Secondary | ICD-10-CM

## 2016-08-10 DIAGNOSIS — E669 Obesity, unspecified: Secondary | ICD-10-CM | POA: Diagnosis not present

## 2016-08-10 DIAGNOSIS — R059 Cough, unspecified: Secondary | ICD-10-CM

## 2016-08-10 DIAGNOSIS — R05 Cough: Secondary | ICD-10-CM | POA: Diagnosis not present

## 2016-08-10 DIAGNOSIS — M5030 Other cervical disc degeneration, unspecified cervical region: Secondary | ICD-10-CM | POA: Diagnosis not present

## 2016-08-10 DIAGNOSIS — G4700 Insomnia, unspecified: Secondary | ICD-10-CM | POA: Diagnosis not present

## 2016-08-10 NOTE — Progress Notes (Signed)
Location:  [facility] clinic Provider: Heywood Tokunaga L. Mariea Clonts, D.O., C.M.D.  Patient Care Team: Gayland Curry, DO as PCP - General (Geriatric Medicine) Gayland Curry, DO as Consulting Physician (Geriatric Medicine) Milus Banister, MD as Attending Physician (Gastroenterology)  Extended Emergency Contact Information Primary Emergency Contact: [place] Phone: [phone] Relation: Son  Code Status: DNR Goals of Care: Advanced Directive information Advanced Directives 06/21/2016  Does patient have an advance directive? Yes  Type of Paramedic of [place];Living will  Does patient want to make changes to advanced directive? -  Copy of advanced directive(s) in chart? -  Pre-existing out of facility DNR order (yellow form or pink MOST form) -   Chief Complaint  Patient presents with  . Annual Exam    wellness    HPI: Patient is a 82 y.o. male seen in today for an annual wellness exam.    Rashes have resolved so not longer using clotrimazole or nystatin.    Feels he has not found something to do that gives him a great deal of satisfaction from doing it.  Has no problem falling asleep but wakes up to urinate and often has difficulty going back to sleep.  Has had increased cough lately--has been using a medication to help with congestion which has helped.  Says he's not timing it well and it's worn off.    Depression screen [facility] 2/9 03/28/2016 03/09/2016 03/15/2015 12/03/2014 04/30/2013  Decreased Interest 0 1 0 0 1  Down, Depressed, Hopeless 0 1 0 1 1  PHQ - 2 Score 0 2 0 1 2  Altered sleeping 1 0 - - 1  Tired, decreased energy 1 1 - - 1  Change in appetite 0 1 - - 1  Feeling bad or failure about yourself  0 0 - - 1  Trouble concentrating 1 1 - - 1  Moving slowly or fidgety/restless 0 0 - - 1  Suicidal thoughts 0 0 - - 1  PHQ-9 Score 3 5 - - 9    Fall Risk  05/08/2016 03/28/2016 03/09/2016 01/24/2016 01/06/2016  Falls in the past year?  No Yes No No No  Number falls in past yr: - 2 or more - - -  Injury with Fall? - Yes - - -   MMSE - Mini Mental State Exam 01/24/2016 06/24/2015 05/21/2014 03/20/2013  Orientation to time 4 3 4 5   Orientation to Place 5 5 5 5   Registration 3 3 3 3   Attention/ Calculation 5 5 5 5   Recall 3 3 2 2   Language- name 2 objects 2 2 2 2   Language- repeat 1 1 1 1   Language- follow 3 step command 3 2 3 3   Language- read & follow direction 1 1 1 1   Write a sentence 1 1 1 1   Copy design 1 1 1 1   Total score 29 27 28 29      Health Maintenance  Topic Date Due  . ZOSTAVAX  08/07/1988  . INFLUENZA VACCINE  05/23/2016  . TETANUS/TDAP  10/23/2021  . PNA vac Low Risk Adult  Completed     Functional Status Survey: Is the patient deaf or have difficulty hearing?: Yes Does the patient have difficulty seeing, even when wearing glasses/contacts?: No Does the patient have difficulty concentrating, remembering, or making decisions?: No Does the patient have difficulty walking or climbing stairs?: No Does the patient have difficulty dressing or bathing?: No Does the patient have difficulty  doing errands alone such as visiting a doctor's office or shopping?: No Current Exercise Habits: The patient does not participate in regular exercise at present Exercise limited by: None identified Diet? Reports not as balanced as it once was--needs to work on it No exam data present Hearing:  HOH, has hearing aides Dentition:  Doing ok  Past Medical History:  Diagnosis Date  . Abdominal aneurysm without mention of rupture   . Anginal pain ([place])    recent hospitalization  . Anxiety   . Arthritis   . Cancer [facility]) Jan. 2017   skin cancer removal/ Right medial lower Leg  . Colon polyps   . COPD (chronic obstructive pulmonary disease) ([place])   . Diverticulosis of colon (without mention of hemorrhage)   . H/O hiatal hernia   . Hyperlipidemia   . Inguinal hernia   . Insomnia, unspecified     Past Surgical  History:  Procedure Laterality Date  . ABDOMINAL AORTIC ENDOVASCULAR STENT GRAFT N/A 10/29/2013   Procedure: ABDOMINAL AORTIC ENDOVASCULAR STENT GRAFT;  Surgeon: [name], MD;  Location: [facility];  Service: Vascular;  Laterality: N/A;  . bilateral shoulder repair    . CHOLECYSTECTOMY  1962  . COLON SURGERY  2007  . HERNIA REPAIR    . MOHS SURGERY Right 11/2015   lower leg  . NASAL SEPTUM SURGERY  2011  . VASECTOMY  1962    Family History  Problem Relation Age of Onset  . Colon cancer Neg Hx   . Heart attack Father 27    Social History   Social History  . Marital status: Married    Spouse name: N/A  . Number of children: N/A  . Years of education: N/A   Occupational History  . retired    Social History Main Topics  . Smoking status: Former Smoker    Packs/day: 0.75    Years: 25.00    Types: Cigarettes    Quit date: 10/23/1984  . Smokeless tobacco: Never Used  . Alcohol use 0.6 oz/week    1 Glasses of wine per week     Comment: social  . Drug use: No  . Sexual activity: No   Other Topics Concern  . None   Social History Narrative  . None    reports that he quit smoking about 31 years ago. His smoking use included Cigarettes. He has a 18.75 pack-year smoking history. He has never used smokeless tobacco. He reports that he drinks about 0.6 oz of alcohol per week . He reports that he does not use drugs.  Allergies  Allergen Reactions  . Celebrex [Celecoxib] Other (See Comments)    Raised B/P   . Codeine Other (See Comments)    Severe stomach pain       Medication List       Accurate as of 08/10/16  4:59 PM. Always use your most recent med list.          aspirin 81 MG tablet Take 81 mg by mouth daily.   CHLOR-TRIMETON 4 MG tablet Generic drug:  chlorpheniramine Take 4-8 mg by mouth every 6 (six) hours as needed for allergies.   citalopram 40 MG tablet Commonly known as:  CELEXA TAKE 1 TABLET BY MOUTH EVERY DAY.   Clotrimazole 1 % Oint Apply 1  application topically 2 (two) times daily. To left ear until resolved   fexofenadine 180 MG tablet Commonly known as:  ALLEGRA Take 1 tablet (180 mg total) by mouth daily.  Fish Oil 1000 MG Caps Take 1 capsule by mouth daily.   GLUCOSAMINE CHONDR 1500 COMPLX PO Take 1 capsule by mouth daily.   LORazepam 1 MG tablet Commonly known as:  ATIVAN Take 0.5 tablet by mouth every morning and take 1 tablet by mouth at bedtime   Melatonin 3 MG Tabs Take 2 tablets by mouth at bedtime.   multivitamin tablet Take 1 tablet by mouth daily.   niacin 100 MG tablet Take 250 mg by mouth daily with breakfast.   OVER THE COUNTER MEDICATION Place 1 drop into both eyes every morning. Eyedrops   simvastatin 40 MG tablet Commonly known as:  ZOCOR TAKE 1 TABLET BY MOUTH AT BEDTIME FOR CHOLESTEROL   SUPER B COMPLEX PO Take 1 tablet by mouth daily.   Vitamin D 2000 units Caps Take 2,000 Units by mouth daily.   vitamin E 400 UNIT capsule Take 400 Units by mouth daily.      Review of Systems:  Review of Systems  Constitutional: Negative for chills and fever.  HENT: Positive for hearing loss.   Eyes: Negative for blurred vision.  Respiratory: Negative for cough and shortness of breath.   Cardiovascular: Negative for chest pain and palpitations.  Gastrointestinal: Negative for abdominal pain, blood in stool, constipation and melena.  Genitourinary: Negative for dysuria.  Musculoskeletal: Positive for myalgias and neck pain. Negative for falls.  Skin: Negative for itching and rash.  Neurological: Negative for dizziness and loss of consciousness.  Endo/Heme/Allergies: Bruises/bleeds easily.  Psychiatric/Behavioral: Positive for memory loss. Negative for depression. The patient is nervous/anxious and has insomnia.     Physical Exam: Vitals:   08/10/16 1333  BP: 138/80  Pulse: 78  Temp: 98.2 F (36.8 C)  TempSrc: Oral  SpO2: 96%  Weight: 198 lb (89.8 kg)  Height: 5\' 8"  (1.727 m)    Body mass index is 30.11 kg/m. Physical Exam  Constitutional: He is oriented to person, place, and time. He appears well-developed and well-nourished. No distress.  Cardiovascular: Normal rate, regular rhythm and intact distal pulses.   Murmur heard. 3/6 systolic murmur in aortic area but audible throughout the precordium  Pulmonary/Chest: Effort normal and breath sounds normal. No respiratory distress.  Abdominal: Soft. Bowel sounds are normal.  Musculoskeletal: Normal range of motion.  Neurological: He is alert and oriented to person, place, and time.  Skin: Skin is warm and dry.  Psychiatric: He has a normal mood and affect.    Labs reviewed: Basic Metabolic Panel:  Recent Labs  05/03/16 0832  NA 141  K 4.2  CL 107  CO2 25  GLUCOSE 91  BUN 19  CREATININE 1.56*  CALCIUM 8.6   Liver Function Tests:  Recent Labs  05/03/16 0832  AST 24  ALT 22  ALKPHOS 64  BILITOT 0.4  PROT 6.8  ALBUMIN 3.9   No results for input(s): LIPASE, AMYLASE in the last 8760 hours. No results for input(s): AMMONIA in the last 8760 hours. CBC:  Recent Labs  05/03/16 0832  WBC 5.9  NEUTROABS 3,068  HGB 12.8*  HCT 37.9*  MCV 93.6  PLT 156   Lipid Panel:  Recent Labs  05/03/16 0832  CHOL 140  HDL 41  LDLCALC 76  TRIG 117  CHOLHDL 3.4   Assessment/Plan 1. DDD (degenerative disc disease), cervical -comes and goes, avoids turning left  2. Hyperlipidemia, unspecified hyperlipidemia type - continues on statin therapy at 89, f/u labs - Lipid panel; Future  3. Insomnia, unspecified type -remains  problematic, but does not accept that he might have any depression, continues on lorazepam -counseled on need to get more physical activity to help him sleep at night  4. Anxiety state -treats with lorazepam with some benefit--aware of side effects--discussed in the past  5. Cough -ongoing, correlated with postnasal drip and worse in fall season now  6. Class 1 obesity  without serious comorbidity with body mass index (BMI) of 30.0 to 30.9 in adult, unspecified obesity type -counseled on diet and exercise regimen  - CBC with Differential/Platelet; Future - COMPLETE METABOLIC PANEL WITH GFR; Future - Lipid panel; Future  Labs/tests ordered:   Orders Placed This Encounter  Procedures  . CBC with Differential/Platelet    Standing Status:   Future    Standing Expiration Date:   02/08/2017  . COMPLETE METABOLIC PANEL WITH GFR    [facility]    Standing Status:   Future    Standing Expiration Date:   02/08/2017  . Lipid panel    Standing Status:   Future    Standing Expiration Date:   02/08/2017    Order Specific Question:   Has the patient fasted?    Answer:   Yes    Next appt:  11/16/2016 med [place]. Shelsy Seng, D.O. [facility] [address], [place] [address] Cell Phone (Mon-Fri 8am-5pm):  [phone] On Call:  [phone] & follow prompts after 5pm & weekends Office Phone:  [phone] Office Fax:  [phone]

## 2016-08-10 NOTE — Patient Instructions (Signed)
Please try to get into a walking or other exercise routine.   Also, please try to make sure you are eating a balanced diet each day with 3 regular meals per day.

## 2016-08-23 ENCOUNTER — Other Ambulatory Visit: Payer: Self-pay | Admitting: Internal Medicine

## 2016-08-23 NOTE — Telephone Encounter (Signed)
Patient requested refill. Called and spoke with pharmacist.

## 2016-09-19 ENCOUNTER — Other Ambulatory Visit: Payer: Self-pay | Admitting: *Deleted

## 2016-09-20 ENCOUNTER — Encounter: Payer: Self-pay | Admitting: Podiatry

## 2016-09-20 ENCOUNTER — Ambulatory Visit (INDEPENDENT_AMBULATORY_CARE_PROVIDER_SITE_OTHER): Payer: Medicare Other | Admitting: Podiatry

## 2016-09-20 VITALS — Ht 68.0 in | Wt 198.0 lb

## 2016-09-20 DIAGNOSIS — B351 Tinea unguium: Secondary | ICD-10-CM

## 2016-09-20 DIAGNOSIS — M79675 Pain in left toe(s): Secondary | ICD-10-CM | POA: Diagnosis not present

## 2016-09-20 NOTE — Progress Notes (Signed)
Patient ID: Andrew Vega, male   DOB: [birth year], 82 y.o.   MRN: [id number] Complaint:  Visit Type: Patient returns to my office for continued preventative foot care services. Complaint: Patient states" my nails have grown long and thick and become painful to walk and wear shoes". He presents for preventative foot care services. No changes to ROS  Podiatric Exam: Vascular: dorsalis pedis and posterior tibial pulses are palpable bilateral. Capillary return is immediate. Temperature gradient is WNL. Skin turgor WNL  Sensorium: Normal Semmes Weinstein monofilament test. Normal tactile sensation bilaterally. Nail Exam: Pt has thick disfigured discolored nails with subungual debris noted bilateral entire nail hallux  Ulcer Exam: There is no evidence of ulcer or pre-ulcerative changes or infection. Orthopedic Exam: Muscle tone and strength are WNL. No limitations in general ROM. No crepitus or effusions noted. Foot type and digits show no abnormalities. Bony prominences are unremarkable. Skin: No Porokeratosis. No infection or ulcers.  Heloma durum fifth toe right foot asymptomatic.  Diagnosis:  Tinea unguium, Pain in right toe, pain in left toes  Treatment & Plan Procedures and Treatment: Consent by patient was obtained for treatment procedures. The patient understood the discussion of treatment and procedures well. All questions were answered thoroughly reviewed. Debridement of mycotic and hypertrophic toenails, 1 through 5 bilateral and clearing of subungual debris. No ulceration, no infection noted.  Return Visit-Office Procedure: Patient instructed to return to the office for a follow up visit 3 months for continued evaluation and treatment.  Gardiner Barefoot DPM

## 2016-10-18 ENCOUNTER — Other Ambulatory Visit: Payer: Self-pay | Admitting: Internal Medicine

## 2016-10-25 ENCOUNTER — Encounter: Payer: Self-pay | Admitting: Interventional Radiology

## 2016-10-26 DIAGNOSIS — Z85828 Personal history of other malignant neoplasm of skin: Secondary | ICD-10-CM | POA: Diagnosis not present

## 2016-10-26 DIAGNOSIS — D225 Melanocytic nevi of trunk: Secondary | ICD-10-CM | POA: Diagnosis not present

## 2016-10-26 DIAGNOSIS — L820 Inflamed seborrheic keratosis: Secondary | ICD-10-CM | POA: Diagnosis not present

## 2016-10-26 DIAGNOSIS — D2271 Melanocytic nevi of right lower limb, including hip: Secondary | ICD-10-CM | POA: Diagnosis not present

## 2016-10-26 DIAGNOSIS — L821 Other seborrheic keratosis: Secondary | ICD-10-CM | POA: Diagnosis not present

## 2016-10-26 DIAGNOSIS — D1801 Hemangioma of skin and subcutaneous tissue: Secondary | ICD-10-CM | POA: Diagnosis not present

## 2016-10-26 DIAGNOSIS — L814 Other melanin hyperpigmentation: Secondary | ICD-10-CM | POA: Diagnosis not present

## 2016-11-06 IMAGING — CT CT CTA ABD/PEL W/CM AND/OR W/O CM
3 of 10 series · 10 of 36 positions shown, 14 images · IV contrast (50CC OMNI 350)
Comparison: CT abdomen pelvis - 06/01/2014; 12/01/2013.

CLINICAL DATA: Fausto Iyer (10/29/2013).

EXAM:
CTA ABDOMEN AND PELVIS WITHOUT CONTRAST
TECHNIQUE: Multidetector CT imaging of the abdomen and pelvis was performed
using the standard protocol during bolus administration of
intravenous contrast. Multiplanar reconstructed images and MIPs were
obtained and reviewed to evaluate the vascular anatomy.
CONTRAST:  50mL OMNIPAQUE IOHEXOL 350 MG/ML SOLN

[Series 5: angio 2.5 · axial · 0.82mm/px · z∈[-347,-134]mm · 3 of 171 slices shown, 7 images]
[im 43/171  soft-tissue]
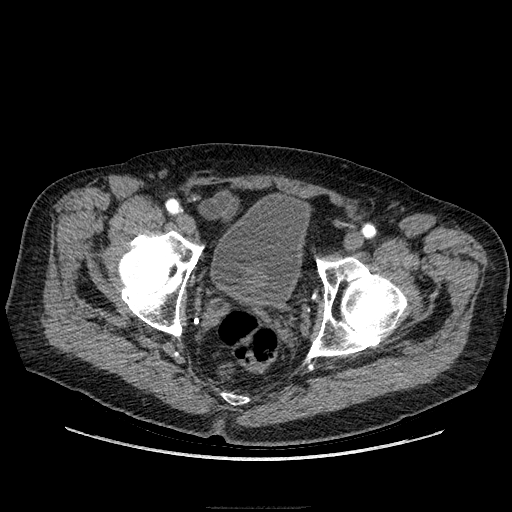
[im 43/171  lung]
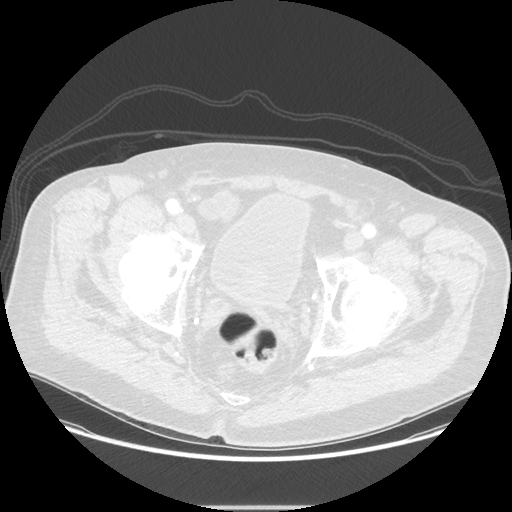
[im 43/171  bone]
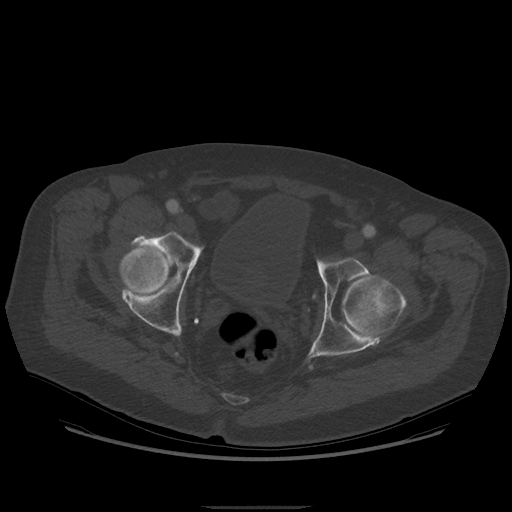
[im 86/171  soft-tissue]
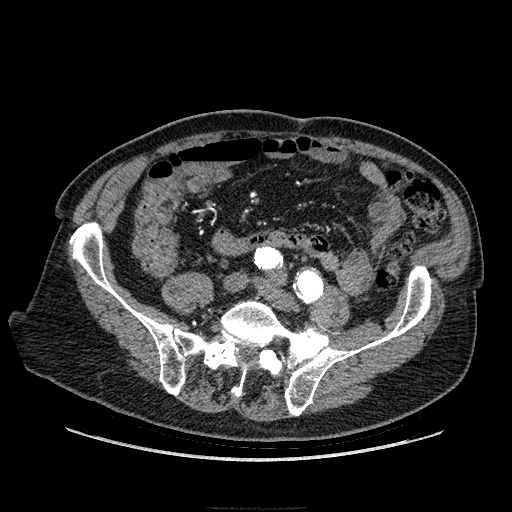
[im 86/171  lung]
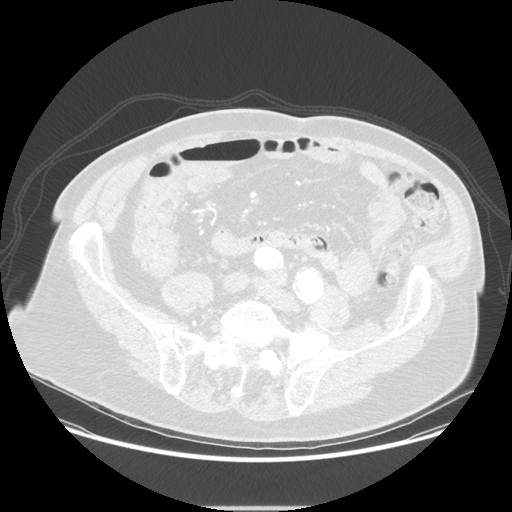
[im 128/171  soft-tissue]
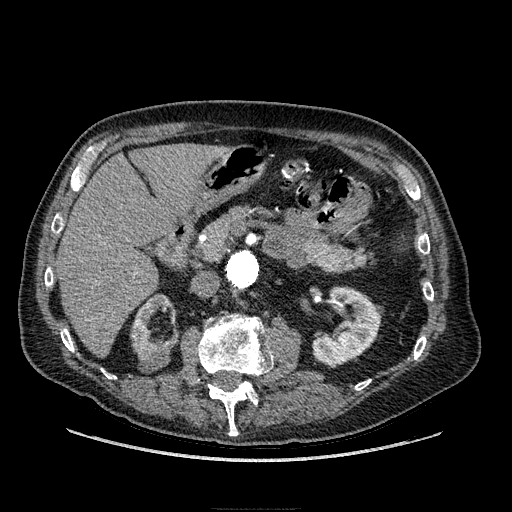
[im 128/171  lung]
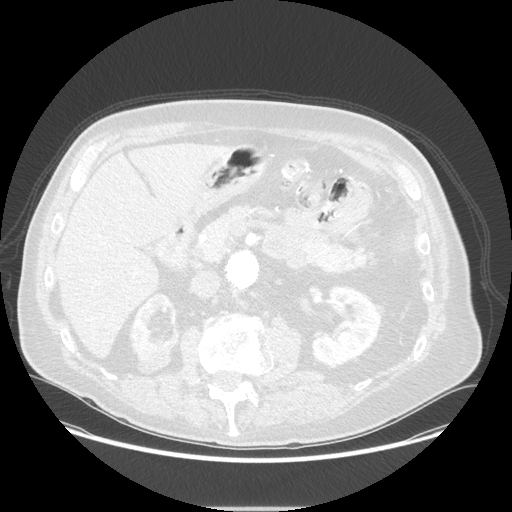

[Series 602: sagittal body · sagittal · 0.83mm/px · 4 of 169 slices shown (1 of 2)]
[im 34/169  soft-tissue]
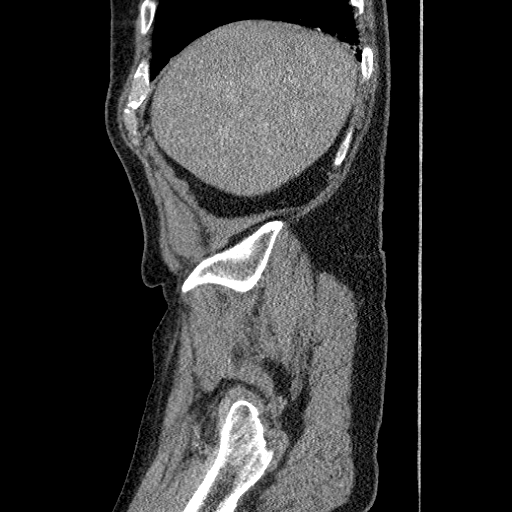
[im 68/169  soft-tissue]
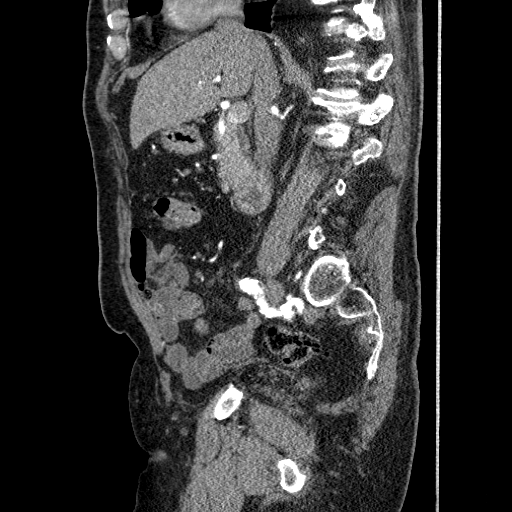
[im 101/169  soft-tissue]
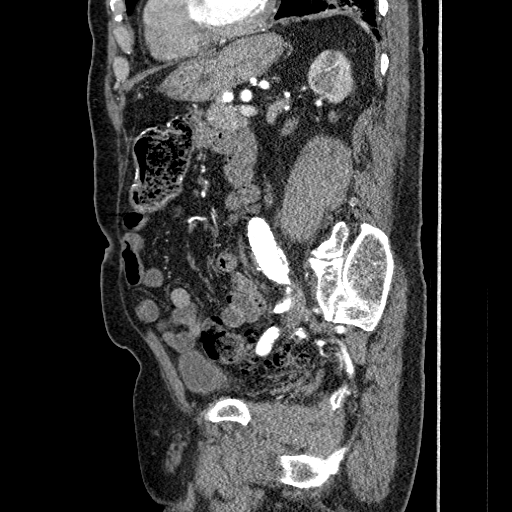
[im 135/169  soft-tissue]
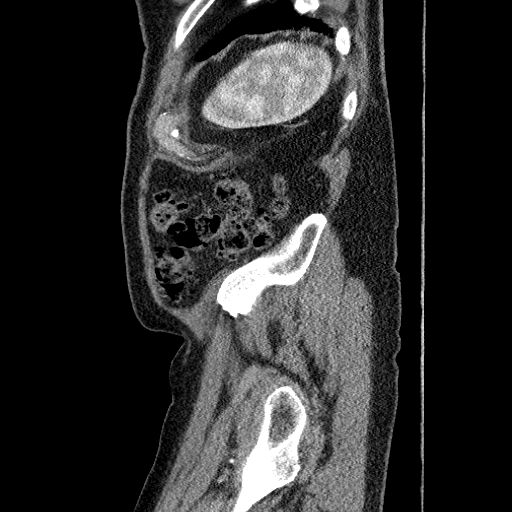

[Series 607: sagittal body · sagittal · 0.82mm/px · 3 of 167 slices shown (2 of 2)]
[im 42/167  soft-tissue]
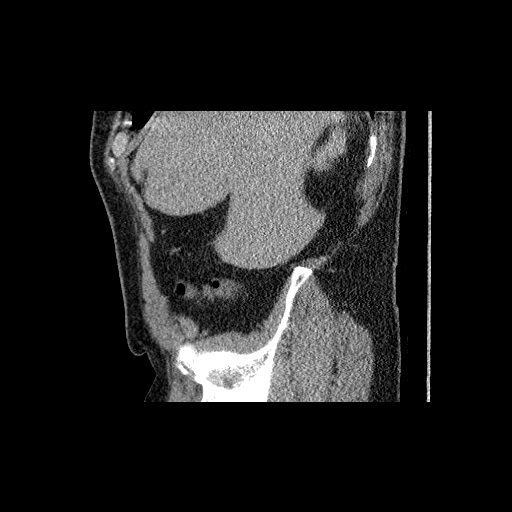
[im 84/167  soft-tissue]
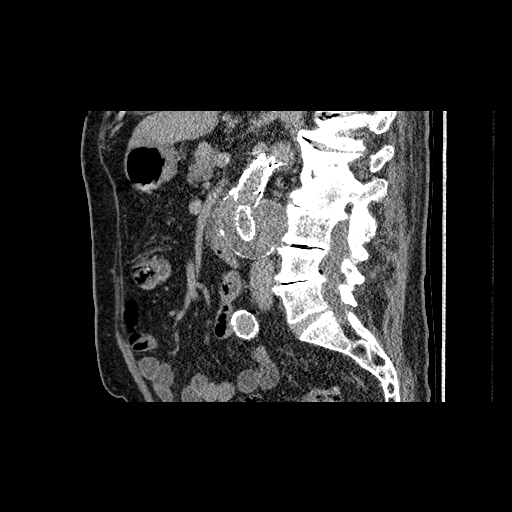
[im 125/167  soft-tissue]
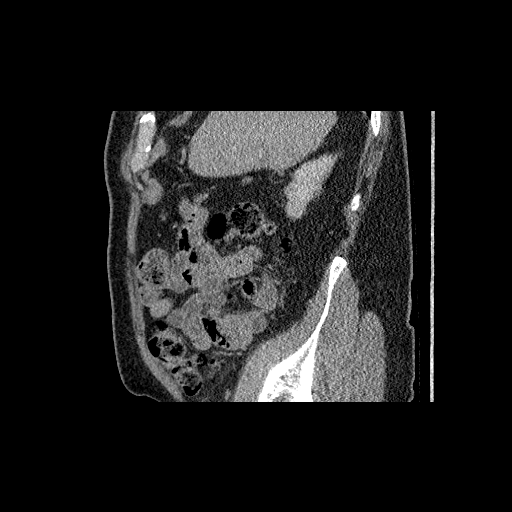

[10 of 36 positions shown; findings below may reference images not displayed]

FINDINGS: Vascular Findings:

Abdominal aorta: Post endovascular repair of infrarenal abdominal
aortic aneurysm. The endovascular stent remains widely patent.

The proximal end of the stent graft appears well apposed against the
walls of the infrarenal abdominal aorta, however there is unchanged
focal outpouching involving the posterior aspect of the infrarenal
abdominal aorta immediately cranial to the proximal landing zone of
the endovascular stent. The focal outpouching is unchanged in size
measuring approximately 3.0 x 1.4 cm (sagittal image 80, series
602). The distal limbs of the endovascular stent are well apposed
against the walls of the bilateral common iliac arteries.

There is persistent enhancement of the excluded native abdominal
aortic aneurysm sac via a type 2 endoleak supplied by the right L3
lumbar artery (representative coronal images 66 - 70, series 601;
axial image 64 and 65, series 5) as well as via the still patent IMA
(representative axial images 58 through 66, series 5).

This finding has been associated with continued minimal growth of
the excluded native abdominal aorta, now measuring approximately
cm in greatest oblique coronal dimension (coronal image 58, series
601), previously, 5.0 cm and approximately 5.6 cm in greatest
oblique sagittal dimension (sagittal image 86, series 602,
previously, 5.4 cm.

Celiac artery: There is a minimal amount of eccentric calcified
plaque involving the origin of the celiac artery, not resulting in
hemodynamically significant stenosis. There is unchanged eccentric
mixed calcified and noncalcified atherosclerotic plaque involving
the distal aspect of the main trunk of the celiac artery extending
to involve the proximal aspect of the splenic artery with focal
ectasia at this location with the distal celiac trunk measuring
approximately 1.3 cm in greatest oblique axial dimension (image 26,
series 5 in the proximal aspect of the splenic artery measuring
approximately 1.1 cm (image 31). The remainder of the splenic artery
is of normal caliber without aneurysmal dilatation. Conventional
branching pattern.

SMA: There is a minimal amount of eccentric mixed calcified and
noncalcified atherosclerotic plaque involving the origin of the SMA,
not resulting in hemodynamically significant stenosis. The distal
tributaries of the SMA are widely patent without discrete
intraluminal filling defect to suggest distal embolism.

Right Renal artery: Solitary ; there is a minimal amount of
eccentric mixed calcified and noncalcified atherosclerotic plaque
involving the origin of the right renal artery, not resulting in
hemodynamically significant stenosis.

Left Renal artery: Solitary; there is unchanged ectasia involving
the origin of the left renal artery measuring approximately 1.1 cm
in greatest oblique coronal dimension (image 68, series 601). There
is a unchanged nonocclusive linear filling defect within the origin
of the left renal artery (coronal image 67, series 601), not
resulting in hemodynamically significant stenosis though favored to
represent a short segment dissection.

IMA: As above, the IMA remains patent and appears to contribute to
the type 2 endoleak.

Right-sided pelvic vasculature: The right limb of the endovascular
stent is well apposed against the walls of the distal aspect of the
right common iliac artery. There is complete exclusion of the right
common iliac artery aneurysm which is unchanged in size measuring
approximately 3.7 cm in maximal oblique axial dimension (axial image
81, series 5). The right internal iliac artery is disease though
patent and of normal caliber. The right external iliac artery is
tortuous but widely patent without hemodynamically significant
narrowing.

Left-sided pelvic vasculature: The left a limb of the endovascular
stent is well apposed against the walls of the distal aspect of the
common iliac artery. The left internal iliac artery is diseased
though patent and of normal caliber. The left external iliac artery
is tortuous though widely patent without hemodynamically significant
stenosis.

Review of the MIP images confirms the above findings.

--------------------------------------------------------------------------------

Nonvascular Findings:

Limited visualization of the lower thorax demonstrates minimal
grossly symmetric subpleural ground-glass atelectasis. No discrete
focal airspace opacities. No pleural effusion. Note is again made of
a mesenteric fat containing diaphragmatic hernia.

Normal hepatic contour. No discrete hepatic lesions. Post
cholecystectomy. No intra extrahepatic biliary duct dilatation. No
ascites.

There is symmetric enhancement and excretion of the bilateral
kidneys. Right-sided renal cysts appear grossly unchanged.
Additional bilateral sub cm hypo attenuating renal lesions are too
small to adequately characterize of favored to represent additional
renal cysts. No renal stones. No urinary destruction or perinephric
stranding.

Normal appearance of the bilateral adrenal glands, pancreas and
spleen.

Extensive colonic diverticulosis without evidence of diverticulitis.
Postsurgical change of the mid transverse colon. Normal appearance
of the retrocecal appendix. Moderate colonic stool burden without
evidence of enteric obstruction. No pneumoperitoneum, pneumatosis or
portal venous gas.

No retroperitoneal, mesenteric, pelvic or inguinal lymphadenopathy.

The prostate is enlarged with mass effect upon the undersurface of
the urinary bladder. Normal appearance of the urinary bladder given
degree distention. No free fluid in the pelvic cul-de-sac.

Similar appearance of interbody fusion of L1-L2. Re- demonstrated
moderate scoliotic curvature of the thoracolumbar spine with
dominant caudal component convex to the left. No anterolisthesis or
retrolisthesis.

Regional soft tissues appear normal.
IMPRESSION: Vascular Impression:

1. Post endovascular repair of infrarenal abdominal aortic aneurysm
with persistent findings of a type 2 endoleak supplied via the right
L3 lumbar artery and the IMA. These findings are associated with
minimal interval enlargement of the native abdominal aortic aneurysm
sac, now measuring approximately 5.6 cm in greatest oblique sagittal
dimension, previously, 5.4 cm. No periaortic stranding.
2. The proximal end of the endovascular stent is well apposed
against the walls of the infrarenal abdominal aorta though there is
unchanged focal outpouching involving primarily the posterior wall
of the perirenal abdominal aorta immediately cranial to the landing
zone of the stent. Again, there is no evidence of a type 1 endoleak.
3. Unchanged focal ectasia involving the origin and proximal aspect
of the left renal artery with associated non flow limiting short
segment dissection.
4. Complete exclusion of right common iliac artery aneurysm.
Nonvascular Impression:

1. Extensive colonic diverticulosis without evidence of
diverticulitis.
2. Stable postsurgical change of the mid aspect of the transverse
colon. Moderate colonic stool burden without evidence of enteric
obstruction.
3. Prostatomegaly with mass effect on the undersurface of the
urinary bladder.

## 2016-11-14 ENCOUNTER — Other Ambulatory Visit: Payer: Self-pay | Admitting: Internal Medicine

## 2016-11-14 ENCOUNTER — Other Ambulatory Visit: Payer: Medicare Other

## 2016-11-14 DIAGNOSIS — E669 Obesity, unspecified: Secondary | ICD-10-CM

## 2016-11-14 DIAGNOSIS — Z6830 Body mass index (BMI) 30.0-30.9, adult: Secondary | ICD-10-CM | POA: Diagnosis not present

## 2016-11-14 DIAGNOSIS — E785 Hyperlipidemia, unspecified: Secondary | ICD-10-CM

## 2016-11-14 LAB — CBC WITH DIFFERENTIAL/PLATELET
Basophils Absolute: 65 cells/uL (ref 0–200)
Basophils Relative: 1 %
Eosinophils Absolute: 325 cells/uL (ref 15–500)
Eosinophils Relative: 5 %
HCT: 40.8 % (ref 38.5–50.0)
Hemoglobin: 13.8 g/dL (ref 13.2–17.1)
Lymphocytes Relative: 31 %
Lymphs Abs: 2015 cells/uL (ref 850–3900)
MCH: 31.6 pg (ref 27.0–33.0)
MCHC: 33.8 g/dL (ref 32.0–36.0)
MCV: 93.4 fL (ref 80.0–100.0)
MPV: 10.6 fL (ref 7.5–12.5)
Monocytes Absolute: 585 cells/uL (ref 200–950)
Monocytes Relative: 9 %
Neutro Abs: 3510 cells/uL (ref 1500–7800)
Neutrophils Relative %: 54 %
Platelets: 180 10*3/uL (ref 140–400)
RBC: 4.37 MIL/uL (ref 4.20–5.80)
RDW: 14.1 % (ref 11.0–15.0)
WBC: 6.5 10*3/uL (ref 3.8–10.8)

## 2016-11-14 LAB — LIPID PANEL
Cholesterol: 154 mg/dL (ref <200)
HDL: 40 mg/dL — ABNORMAL LOW (ref >40)
LDL Cholesterol: 79 mg/dL (ref <100)
Total CHOL/HDL Ratio: 3.9 Ratio (ref <5.0)
Triglycerides: 175 mg/dL — ABNORMAL HIGH (ref <150)
VLDL: 35 mg/dL — ABNORMAL HIGH (ref <30)

## 2016-11-14 LAB — COMPLETE METABOLIC PANEL WITH GFR
ALT: 19 U/L (ref 9–46)
AST: 23 U/L (ref 10–35)
Albumin: 3.8 g/dL (ref 3.6–5.1)
Alkaline Phosphatase: 55 U/L (ref 40–115)
BUN: 21 mg/dL (ref 7–25)
CO2: 25 mmol/L (ref 20–31)
Calcium: 9.3 mg/dL (ref 8.6–10.3)
Chloride: 108 mmol/L (ref 98–110)
Creat: 1.64 mg/dL — ABNORMAL HIGH (ref 0.70–1.11)
GFR, Est African American: 43 mL/min — ABNORMAL LOW (ref >=60)
GFR, Est Non African American: 37 mL/min — ABNORMAL LOW (ref >=60)
Glucose, Bld: 108 mg/dL — ABNORMAL HIGH (ref 65–99)
Potassium: 4.8 mmol/L (ref 3.5–5.3)
Sodium: 141 mmol/L (ref 135–146)
Total Bilirubin: 0.5 mg/dL (ref 0.2–1.2)
Total Protein: 7 g/dL (ref 6.1–8.1)

## 2016-11-16 ENCOUNTER — Encounter: Payer: Self-pay | Admitting: Internal Medicine

## 2016-11-16 ENCOUNTER — Ambulatory Visit (INDEPENDENT_AMBULATORY_CARE_PROVIDER_SITE_OTHER): Payer: Medicare Other | Admitting: Internal Medicine

## 2016-11-16 VITALS — BP 120/60 | HR 82 | Temp 98.2°F | Wt 193.0 lb

## 2016-11-16 DIAGNOSIS — H6123 Impacted cerumen, bilateral: Secondary | ICD-10-CM

## 2016-11-16 DIAGNOSIS — E785 Hyperlipidemia, unspecified: Secondary | ICD-10-CM

## 2016-11-16 DIAGNOSIS — Z86010 Personal history of colonic polyps: Secondary | ICD-10-CM

## 2016-11-16 DIAGNOSIS — M5030 Other cervical disc degeneration, unspecified cervical region: Secondary | ICD-10-CM

## 2016-11-16 DIAGNOSIS — R194 Change in bowel habit: Secondary | ICD-10-CM

## 2016-11-16 DIAGNOSIS — F411 Generalized anxiety disorder: Secondary | ICD-10-CM | POA: Diagnosis not present

## 2016-11-16 MED ORDER — LORAZEPAM 1 MG PO TABS: 1.5000 mg | ORAL_TABLET | Freq: Every day | ORAL | 0 refills | Status: DC

## 2016-11-16 NOTE — Patient Instructions (Signed)
We will get records from your prior gastroenterologist and surgeon about your prior colon polyp and bowel resection.  I will look that over and determine if you need to see gastroenterology here.  For your ear wax, please buy debrox over the counter wax removal drops.  Put 5 drops in each ear before bed and put cotton balls into the outer part of your ears at bedtime.  Do this for 5 days or until you see the wax coming out on the cotton.  For your dry skin and irritation of the your left ear, please purchase an over the counter cream (ask pharmacist) that is similar to the clotrimazole.

## 2016-11-16 NOTE — Progress Notes (Signed)
Location:  [facility] clinic Provider:  Cheyeanne Roadcap L. Mariea Clonts, D.O., C.M.D.  Code Status: DNR Goals of Care:  Advanced Directives 11/16/2016  Does Patient Have a Medical Advance Directive? Yes  Type of Advance Directive [place]  Does patient want to make changes to medical advance directive? No - Patient declined  Copy of [place] in Chart? Yes  Pre-existing out of facility DNR order (yellow form or pink MOST form) -     Chief Complaint  Patient presents with  . Medical Management of Chronic Issues    56mth follow-up    HPI: Patient is a 83 y.o. male seen today for medical management of chronic diseases.    Has not had cscope in 7 years or more in [place].  A few years before he had a bowel resection for a polyp that was missed by cscope.  The f/u cscope was clear.  He finds his bowels are not as regular as they used to be.  He's been trying to strengthen his muscles/sphincters. He's more sedentary and he will have to work to move his bowels.  Other times, he is having loose, runny stools.  We need records on this.  No blood in stool or very dark stool. Not anemic at present.  He reports he's been eating more consistent meals and the right foods.  Getting meat and 1-2 veggies at dinner.  He is drinking more water now with the decongestant (2-3x per day)--is drinking water instead of other drinks.  He has lost 5 lbs since his last appt here.    He is taking a decongestant for increased mucus in his throat--the medication he takes is helpful.    Neck:  Has to avoid certain movements that worsen.  Cannot turn his head too much to the left.    He is still not in the happiest frame of mind.  He went to some therapy sessions to avoid depression.  Feels like it's easier to concern himself over minor things.  Almost was dreading small things he'd worry about.  His wife's progressing dementia of course weighs on him.  She has care 9am to 9pm.  He feels like he's left out  of it at times.  He is considering moving [place] with his family in [place].    Past Medical History:  Diagnosis Date  . Abdominal aneurysm without mention of rupture   . Anginal pain ([place])    recent hospitalization  . Anxiety   . Arthritis   . Cancer [facility]) Jan. 2017   skin cancer removal/ Right medial lower Leg  . Colon polyps   . COPD (chronic obstructive pulmonary disease) ([place])   . Diverticulosis of colon (without mention of hemorrhage)   . H/O hiatal hernia   . Hyperlipidemia   . Inguinal hernia   . Insomnia, unspecified     Past Surgical History:  Procedure Laterality Date  . ABDOMINAL AORTIC ENDOVASCULAR STENT GRAFT N/A 10/29/2013   Procedure: ABDOMINAL AORTIC ENDOVASCULAR STENT GRAFT;  Surgeon: [name], MD;  Location: [place];  Service: Vascular;  Laterality: N/A;  . bilateral shoulder repair    . CHOLECYSTECTOMY  1962  . COLON SURGERY  2007  . HERNIA REPAIR    . IR GENERIC HISTORICAL  12/24/2014   IR RADIOLOGIST EVAL & MGMT 12/24/2014 Jacqulynn Cadet, MD [facility] INTERV RAD  . MOHS SURGERY Right 11/2015   lower leg  . NASAL SEPTUM SURGERY  2011  . VASECTOMY  1962    Allergies  Allergen Reactions  . Celebrex [Celecoxib] Other (See Comments)    Raised B/P   . Codeine Other (See Comments)    Severe stomach pain     Allergies as of 11/16/2016      Reactions   Celebrex [celecoxib] Other (See Comments)   Raised B/P    Codeine Other (See Comments)   Severe stomach pain       Medication List       Accurate as of 11/16/16 11:11 AM. Always use your most recent med list.          aspirin 81 MG tablet Take 81 mg by mouth daily.   CHLOR-TRIMETON 4 MG tablet Generic drug:  chlorpheniramine Take 4-8 mg by mouth every 6 (six) hours as needed for allergies.   Clotrimazole 1 % Oint Apply 1 application topically 2 (two) times daily. To left ear until resolved   fexofenadine 180 MG tablet Commonly known as:  ALLEGRA Take 1 tablet (180 mg total) by mouth daily.     Fish Oil 1000 MG Caps Take 1 capsule by mouth daily.   GLUCOSAMINE CHONDR 1500 COMPLX PO Take 1 capsule by mouth daily.   LORazepam 1 MG tablet Commonly known as:  ATIVAN TAKE 1/2 TABLET BY MOUTH EVERY MORNING AND 1 AT BEDTIME.   Melatonin 3 MG Tabs Take 2 tablets by mouth at bedtime.   multivitamin tablet Take 1 tablet by mouth daily.   niacin 100 MG tablet Take 250 mg by mouth daily with breakfast.   OVER THE COUNTER MEDICATION Place 1 drop into both eyes every morning. Eyedrops   simvastatin 40 MG tablet Commonly known as:  ZOCOR TAKE 1 TABLET BY MOUTH AT BEDTIME FOR CHOLESTEROL   SUPER B COMPLEX PO Take 1 tablet by mouth daily.   Vitamin D 2000 units Caps Take 2,000 Units by mouth daily.   vitamin E 400 UNIT capsule Take 400 Units by mouth daily.       Review of Systems:  Review of Systems  Constitutional: Negative for chills, fever and malaise/fatigue.  HENT: Positive for hearing loss. Negative for congestion.        Wearing hearing aides  Eyes: Negative for blurred vision.  Respiratory: Negative for cough, shortness of breath and wheezing.   Cardiovascular: Negative for chest pain, palpitations and leg swelling.  Gastrointestinal: Positive for constipation and diarrhea. Negative for abdominal pain, blood in stool, melena, nausea and vomiting.  Genitourinary: Negative for dysuria.  Musculoskeletal: Positive for neck pain. Negative for myalgias.       Neck still hurts if he turns too far left  Skin: Negative for itching and rash.  Neurological: Negative for dizziness, loss of consciousness and weakness.  Psychiatric/Behavioral: Positive for memory loss. Negative for depression. The patient is nervous/anxious.     Health Maintenance  Topic Date Due  . ZOSTAVAX  08/07/1988  . TETANUS/TDAP  10/23/2021  . INFLUENZA VACCINE  Completed  . PNA vac Low Risk Adult  Completed    Physical Exam: Vitals:   11/16/16 1053  BP: 120/60  Pulse: 82  Temp: 98.2  F (36.8 C)  TempSrc: Oral  SpO2: 96%  Weight: 193 lb (87.5 kg)   Body mass index is 29.35 kg/m. Physical Exam  Constitutional: He is oriented to person, place, and time. He appears well-developed and well-nourished. No distress.  Cardiovascular: Normal rate and regular rhythm.   Murmur heard. Pulmonary/Chest: Effort normal and breath sounds normal. No respiratory distress.  Abdominal: Soft. Bowel sounds are normal. He exhibits no distension.  Musculoskeletal: Normal range of motion. He exhibits no tenderness.  Decreased rotation to the left   Neurological: He is alert and oriented to person, place, and time. No cranial nerve deficit.  Skin: Skin is warm and dry. Capillary refill takes less than 2 seconds.  Psychiatric: He has a normal mood and affect.    Labs reviewed: Basic Metabolic Panel:  Recent Labs  05/03/16 0832 11/14/16 0941  NA 141 141  K 4.2 4.8  CL 107 108  CO2 25 25  GLUCOSE 91 108*  BUN 19 21  CREATININE 1.56* 1.64*  CALCIUM 8.6 9.3   Liver Function Tests:  Recent Labs  05/03/16 0832 11/14/16 0941  AST 24 23  ALT 22 19  ALKPHOS 64 55  BILITOT 0.4 0.5  PROT 6.8 7.0  ALBUMIN 3.9 3.8   No results for input(s): LIPASE, AMYLASE in the last 8760 hours. No results for input(s): AMMONIA in the last 8760 hours. CBC:  Recent Labs  05/03/16 0832 11/14/16 0941  WBC 5.9 6.5  NEUTROABS 3,068 3,510  HGB 12.8* 13.8  HCT 37.9* 40.8  MCV 93.6 93.4  PLT 156 180   Lipid Panel:  Recent Labs  05/03/16 0832 11/14/16 0941  CHOL 140 154  HDL 41 40*  LDLCALC 76 79  TRIG 117 175*  CHOLHDL 3.4 3.9   Assessment/Plan 1. Change in bowel habits -obtain records from GI and surgeon in [place] from at least 7 years ago (hopefully still in existence)  -I will review and determine if he needs to be seen here for cscope (given age of 53)  2. History of colonic polyps -sounds like he had a premalignant or malignant polyp necessitating resection of part of his  bowel (never discussed before)  3. DDD (degenerative disc disease), cervical -improved after therapy, continues to have pain if he turns his head too far left  4. Hyperlipidemia, unspecified hyperlipidemia type -lipids satisfactory considering his age   39. Anxiety state -continues on ativan at night for nerves and sleep--I've tried to taper him off to no avail -has seen a therapist which has helped him -has plans to possibly move up [place] with his family  6. Bilateral impacted cerumen -debrox over the counter wax removal drops.  Put 5 drops in each ear before bed and put cotton balls into the outer part of your ears at bedtime.  Do this for 5 days or until you see the wax coming out on the cotton.  Labs/tests ordered:  No orders of the defined types were placed in this encounter.  Next appt:  02/15/2017  Sabir Charters L. Epic Tribbett, D.O. [facility] [address], [place] [address] Cell Phone (Mon-Fri 8am-5pm):  [phone] On Call:  [phone] & follow prompts after 5pm & weekends Office Phone:  [phone] Office Fax:  [phone]

## 2016-11-23 ENCOUNTER — Other Ambulatory Visit: Payer: Self-pay | Admitting: Internal Medicine

## 2016-11-23 DIAGNOSIS — Z86010 Personal history of colonic polyps: Secondary | ICD-10-CM

## 2016-11-23 DIAGNOSIS — R194 Change in bowel habit: Secondary | ICD-10-CM

## 2016-11-23 NOTE — Progress Notes (Signed)
Received the records requested from Rosine Door, MD, PA from pt's prior partial transverse colectomy for adenomatous polyp in 2008.  Will place GI referral for further assessment.  Please also see my last note for details.

## 2016-11-30 ENCOUNTER — Encounter: Payer: Self-pay | Admitting: Podiatry

## 2016-11-30 ENCOUNTER — Ambulatory Visit (INDEPENDENT_AMBULATORY_CARE_PROVIDER_SITE_OTHER): Payer: Medicare Other | Admitting: Podiatry

## 2016-11-30 VITALS — Ht 68.0 in | Wt 198.0 lb

## 2016-11-30 DIAGNOSIS — M79675 Pain in left toe(s): Secondary | ICD-10-CM

## 2016-11-30 DIAGNOSIS — L84 Corns and callosities: Secondary | ICD-10-CM

## 2016-11-30 DIAGNOSIS — B351 Tinea unguium: Secondary | ICD-10-CM

## 2016-11-30 NOTE — Progress Notes (Signed)
Patient ID: Andrew Vega, male   DOB: 17-May-1926, 83 y.o.   MRN: [id number] Complaint:  Visit Type: Patient returns to my office for continued preventative foot care services. Complaint: Patient states" my nails have grown long and thick and become painful to walk and wear shoes". He presents for preventative foot care services. No changes to ROS.  Patient has painful corn on the tip third toe left foot.  Podiatric Exam: Vascular: dorsalis pedis and posterior tibial pulses are palpable bilateral. Capillary return is immediate. Temperature gradient is WNL. Skin turgor WNL  Sensorium: Normal Semmes Weinstein monofilament test. Normal tactile sensation bilaterally. Nail Exam: Pt has thick disfigured discolored nails with subungual debris noted bilateral entire nail hallux  Ulcer Exam: There is no evidence of ulcer or pre-ulcerative changes or infection. Orthopedic Exam: Muscle tone and strength are WNL. No limitations in general ROM. No crepitus or effusions noted. Foot type and digits show no abnormalities. Bony prominences are unremarkable. Skin: No Porokeratosis. No infection or ulcers.  Heloma durum fifth toe right foot asymptomatic.  Diagnosis:  Tinea unguium, Pain in right toe, pain in left toes clavi  Treatment & Plan Procedures and Treatment: Consent by patient was obtained for treatment procedures. The patient understood the discussion of treatment and procedures well. All questions were answered thoroughly reviewed. Debridement of mycotic and hypertrophic toenails, 1 through 5 bilateral and clearing of subungual debris. No ulceration, no infection noted. Debride clavi Return Visit-Office Procedure: Patient instructed to return to the office for a follow up visit 3 months for continued evaluation and treatment.  Gardiner Barefoot DPM

## 2016-12-05 DIAGNOSIS — H02032 Senile entropion of right lower eyelid: Secondary | ICD-10-CM | POA: Diagnosis not present

## 2016-12-06 ENCOUNTER — Telehealth: Payer: Self-pay | Admitting: Podiatry

## 2016-12-06 NOTE — Telephone Encounter (Signed)
Pt called and wanted Dr Prudence Davidson to call him back Andrew Vega some after visit care advise.

## 2016-12-13 ENCOUNTER — Ambulatory Visit: Payer: Medicare Other | Admitting: Podiatry

## 2016-12-15 ENCOUNTER — Other Ambulatory Visit: Payer: Self-pay | Admitting: Internal Medicine

## 2016-12-20 ENCOUNTER — Encounter: Payer: Self-pay | Admitting: Podiatry

## 2016-12-20 ENCOUNTER — Ambulatory Visit (INDEPENDENT_AMBULATORY_CARE_PROVIDER_SITE_OTHER): Payer: Self-pay | Admitting: Podiatry

## 2016-12-20 DIAGNOSIS — M79675 Pain in left toe(s): Secondary | ICD-10-CM

## 2016-12-20 DIAGNOSIS — B351 Tinea unguium: Secondary | ICD-10-CM

## 2016-12-20 DIAGNOSIS — L84 Corns and callosities: Secondary | ICD-10-CM

## 2016-12-20 NOTE — Progress Notes (Signed)
Patient ID: Andrew Vega, male   DOB: 09/16/1926, 83 y.o.   MRN: [id number] Complaint:  Visit Type: Patient returns to my office for pain noted on the third and fourth toes of the left foot. Patient has had a distal clavi. Of the third toe, which was treated through this office. He says that the nail from the fourth toe was now rubbing into the third toe causing pain and discomfort as he walks and wears his shoes. He presents the office today for continued evaluation and treatment  Podiatric Exam: Vascular: dorsalis pedis and posterior tibial pulses are palpable bilateral. Capillary return is immediate. Temperature gradient is WNL. Skin turgor WNL  Sensorium: Normal Semmes Weinstein monofilament test. Normal tactile sensation bilaterally. Nail Exam: Pt has thick disfigured discolored nails with subungual debris noted bilateral entire nail hallux  Ulcer Exam: There is no evidence of ulcer or pre-ulcerative changes or infection. Orthopedic Exam: Muscle tone and strength are WNL. No limitations in general ROM. No crepitus or effusions noted. Foot type and digits show no abnormalities. Bony prominences are unremarkable. Skin: No Porokeratosis. No infection or ulcers.  Heloma durum fifth toe right foot asymptomatic. Distal clavi 3rd left  Diagnosis:  Tinea unguium, Pain in right toe, pain in left toes clavi  Treatment & Plan Procedures and Treatment: Consent by patient was obtained for treatment procedures. The patient understood the discussion of treatment and procedures well. All questions were answered thoroughly reviewed. Debridement of mycotic and hypertrophic toenails, 1 through 5 bilateral and clearing of subungual debris. No ulceration, no infection noted. Debride clavi Return Visit-Office Procedure: Patient instructed to return to the office for a follow up visit 3 months for continued evaluation and treatment.  Watt Geiler DPM 

## 2016-12-25 ENCOUNTER — Ambulatory Visit: Payer: Medicare Other | Admitting: Family

## 2016-12-25 ENCOUNTER — Other Ambulatory Visit (HOSPITAL_COMMUNITY): Payer: Medicare Other

## 2016-12-26 ENCOUNTER — Encounter: Payer: Self-pay | Admitting: Nurse Practitioner

## 2016-12-26 ENCOUNTER — Ambulatory Visit (INDEPENDENT_AMBULATORY_CARE_PROVIDER_SITE_OTHER): Payer: Medicare Other | Admitting: Nurse Practitioner

## 2016-12-26 VITALS — BP 112/78 | HR 86 | Temp 97.8°F | Resp 18 | Ht 68.0 in | Wt 189.4 lb

## 2016-12-26 DIAGNOSIS — R350 Frequency of micturition: Secondary | ICD-10-CM | POA: Diagnosis not present

## 2016-12-26 DIAGNOSIS — R8299 Other abnormal findings in urine: Secondary | ICD-10-CM

## 2016-12-26 DIAGNOSIS — R82998 Other abnormal findings in urine: Secondary | ICD-10-CM

## 2016-12-26 LAB — POCT URINALYSIS DIPSTICK
Glucose, UA: NEGATIVE
NITRITE UA: POSITIVE
PH UA: 5
SPEC GRAV UA: 1.025
UROBILINOGEN UA: 0.2

## 2016-12-26 MED ORDER — CIPROFLOXACIN HCL 500 MG PO TABS
500.0000 mg | ORAL_TABLET | Freq: Two times a day (BID) | ORAL | 0 refills | Status: DC
Start: 2016-12-26 — End: 2017-02-06

## 2016-12-26 NOTE — Progress Notes (Signed)
Careteam: Patient Care Team: Gayland Curry, DO as PCP - General (Geriatric Medicine) Gayland Curry, DO as Consulting Physician (Geriatric Medicine) Milus Banister, MD as Attending Physician (Gastroenterology)  Advanced Directive information Does Patient Have a Medical Advance Directive?: Yes, Type of Advance Directive: [place];Living will  Allergies  Allergen Reactions  . Celebrex [Celecoxib] Other (See Comments)    Raised B/P   . Codeine Other (See Comments)    Severe stomach pain     Chief Complaint  Patient presents with  . Acute Visit    Pt is being seen due to frequent urination x 1 week, especially when standing. Pt also states he is having difficulty sleeping but not to urinary changes.      HPI: Patient is a 83 y.o. male seen in the office today due to frequency in urination with blood in urine for ~1 week. Reports trace of blood. Last 2 days there was not any blood.  No pain with urination.  Has stopped drinking liquids because he has to urinate the min he gets up.  No fevers or chills. No palpitations or chest pains. No hx of UTI No back pain.   Review of Systems:  Review of Systems  Constitutional: Negative for activity change and appetite change.  Respiratory: Negative for shortness of breath.   Cardiovascular: Negative for chest pain.  Genitourinary: Positive for frequency and hematuria. Negative for difficulty urinating, discharge, dysuria, genital sores, penile pain, penile swelling, scrotal swelling and testicular pain.  Neurological: Negative for weakness.    Past Medical History:  Diagnosis Date  . Abdominal aneurysm without mention of rupture   . Anginal pain ([place])    recent hospitalization  . Anxiety   . Arthritis   . Cancer [facility] [place]) Jan. 2017   skin cancer removal/ Right medial lower Leg  . Colon polyps   . COPD (chronic obstructive pulmonary disease) ([place])   . Diverticulosis of colon (without mention of hemorrhage)     . H/O hiatal hernia   . Hyperlipidemia   . Inguinal hernia   . Insomnia, unspecified    Past Surgical History:  Procedure Laterality Date  . ABDOMINAL AORTIC ENDOVASCULAR STENT GRAFT N/A 10/29/2013   Procedure: ABDOMINAL AORTIC ENDOVASCULAR STENT GRAFT;  Surgeon: [name], MD;  Location: [place];  Service: Vascular;  Laterality: N/A;  . bilateral shoulder repair    . CHOLECYSTECTOMY  1962  . COLON SURGERY  2007  . HERNIA REPAIR    . IR GENERIC HISTORICAL  12/24/2014   IR RADIOLOGIST EVAL & MGMT 12/24/2014 Jacqulynn Cadet, MD [facility] INTERV RAD  . MOHS SURGERY Right 11/2015   lower leg  . NASAL SEPTUM SURGERY  2011  . VASECTOMY  1962   Social History:   reports that he quit smoking about 32 years ago. His smoking use included Cigarettes. He has a 18.75 pack-year smoking history. He has never used smokeless tobacco. He reports that he drinks about 0.6 oz of alcohol per week . He reports that he does not use drugs.  Family History  Problem Relation Age of Onset  . Heart attack Father 86  . Colon cancer Neg Hx     Medications: Patient's Medications  New Prescriptions   No medications on file  Previous Medications   ASPIRIN 81 MG TABLET    Take 81 mg by mouth daily.   B COMPLEX-C (SUPER B COMPLEX PO)    Take 1 tablet by mouth daily.  CHLORPHENIRAMINE (CHLOR-TRIMETON) 4 MG TABLET    Take 4-8 mg by mouth every 6 (six) hours as needed for allergies.    CHOLECALCIFEROL (VITAMIN D) 2000 UNITS CAPS    Take 2,000 Units by mouth daily.    CLOTRIMAZOLE 1 % OINT    Apply 1 application topically 2 (two) times daily. To left ear until resolved   FEXOFENADINE (ALLEGRA) 180 MG TABLET    Take 1 tablet (180 mg total) by mouth daily.   GLUCOSAMINE-CHONDROIT-VIT C-MN (GLUCOSAMINE CHONDR 1500 COMPLX PO)    Take 1 capsule by mouth daily.   LORAZEPAM (ATIVAN) 1 MG TABLET    TAKE 1 1/2 TABLETS BY MOUTH ONCE AT BEDTIME   MELATONIN 3 MG TABS    Take 2 tablets by mouth at bedtime.    MULTIPLE VITAMIN  (MULTIVITAMIN) TABLET    Take 1 tablet by mouth daily.   NIACIN 100 MG TABLET    Take 250 mg by mouth daily with breakfast.    OMEGA-3 FATTY ACIDS (FISH OIL) 1000 MG CAPS    Take 1 capsule by mouth daily.   OVER THE COUNTER MEDICATION    Place 1 drop into both eyes every morning. Eyedrops   SIMVASTATIN (ZOCOR) 40 MG TABLET    TAKE 1 TABLET BY MOUTH AT BEDTIME FOR CHOLESTEROL   VITAMIN E 400 UNIT CAPSULE    Take 400 Units by mouth daily.  Modified Medications   No medications on file  Discontinued Medications   No medications on file     Physical Exam:  There were no vitals filed for this visit. There is no height or weight on file to calculate BMI.  Physical Exam  Constitutional: He is oriented to person, place, and time. He appears well-developed and well-nourished. No distress.  Cardiovascular: Normal rate and regular rhythm.   Murmur heard. 3/6 systolic murmur in aortic area but audible throughout the precordium  Pulmonary/Chest: Effort normal and breath sounds normal. No respiratory distress.  Abdominal: Soft. Bowel sounds are normal. He exhibits no distension. There is no tenderness. There is no rebound and no CVA tenderness.  Musculoskeletal: Normal range of motion.  Neurological: He is alert and oriented to person, place, and time.  Skin: Skin is warm and dry.  Psychiatric: He has a normal mood and affect.   Labs reviewed: Basic Metabolic Panel:  Recent Labs  05/03/16 0832 11/14/16 0941  NA 141 141  K 4.2 4.8  CL 107 108  CO2 25 25  GLUCOSE 91 108*  BUN 19 21  CREATININE 1.56* 1.64*  CALCIUM 8.6 9.3   Liver Function Tests:  Recent Labs  05/03/16 0832 11/14/16 0941  AST 24 23  ALT 22 19  ALKPHOS 64 55  BILITOT 0.4 0.5  PROT 6.8 7.0  ALBUMIN 3.9 3.8   No results for input(s): LIPASE, AMYLASE in the last 8760 hours. No results for input(s): AMMONIA in the last 8760 hours. CBC:  Recent Labs  05/03/16 0832 11/14/16 0941  WBC 5.9 6.5  NEUTROABS  3,068 3,510  HGB 12.8* 13.8  HCT 37.9* 40.8  MCV 93.6 93.4  PLT 156 180   Lipid Panel:  Recent Labs  05/03/16 0832 11/14/16 0941  CHOL 140 154  HDL 41 40*  LDLCALC 76 79  TRIG 117 175*  CHOLHDL 3.4 3.9   TSH: No results for input(s): TSH in the last 8760 hours. A1C: No results found for: HGBA1C   Assessment/Plan 1. Frequent urination - POCT urinalysis dipstick reveals leukocytes and  blood.   2. Leukocytes in urine - Culture, Urine - ciprofloxacin (CIPRO) 500 MG tablet; Take 1 tablet (500 mg total) by mouth 2 (two) times daily.  Dispense: 14 tablet; Refill: 0 -to increase hydration -information provided on when to seek emergency care, pt agrees    Janett Billow K. Harle Battiest  [facility] [phone] 8 am - 5 pm) [phone] (after hours)

## 2016-12-26 NOTE — Patient Instructions (Signed)
Will send urine off for culture Start Cipro 500 mg by mouth twice daily for 1 week Really important to increase hydration

## 2016-12-28 LAB — URINE CULTURE

## 2017-01-01 ENCOUNTER — Other Ambulatory Visit: Payer: Self-pay

## 2017-01-01 ENCOUNTER — Ambulatory Visit: Payer: Medicare Other | Admitting: Internal Medicine

## 2017-01-01 DIAGNOSIS — R350 Frequency of micturition: Secondary | ICD-10-CM

## 2017-01-03 ENCOUNTER — Other Ambulatory Visit (INDEPENDENT_AMBULATORY_CARE_PROVIDER_SITE_OTHER): Payer: Medicare Other

## 2017-01-03 DIAGNOSIS — R319 Hematuria, unspecified: Secondary | ICD-10-CM

## 2017-01-03 LAB — POCT URINALYSIS DIPSTICK
Bilirubin, UA: NEGATIVE
Blood, UA: NEGATIVE
GLUCOSE UA: NEGATIVE
Ketones, UA: NEGATIVE
LEUKOCYTES UA: NEGATIVE
Nitrite, UA: NEGATIVE
PROTEIN UA: NEGATIVE
SPEC GRAV UA: 1.01
UROBILINOGEN UA: 0.2
pH, UA: 5

## 2017-01-13 ENCOUNTER — Other Ambulatory Visit: Payer: Self-pay | Admitting: Internal Medicine

## 2017-01-30 ENCOUNTER — Other Ambulatory Visit (HOSPITAL_COMMUNITY): Payer: Self-pay | Admitting: Interventional Radiology

## 2017-01-30 DIAGNOSIS — IMO0001 Reserved for inherently not codable concepts without codable children: Secondary | ICD-10-CM

## 2017-01-30 DIAGNOSIS — T82330D Leakage of aortic (bifurcation) graft (replacement), subsequent encounter: Secondary | ICD-10-CM

## 2017-02-06 ENCOUNTER — Encounter: Payer: Self-pay | Admitting: Gastroenterology

## 2017-02-06 ENCOUNTER — Ambulatory Visit (INDEPENDENT_AMBULATORY_CARE_PROVIDER_SITE_OTHER): Payer: Medicare Other | Admitting: Gastroenterology

## 2017-02-06 VITALS — BP 120/70 | HR 72 | Ht 68.0 in | Wt 190.0 lb

## 2017-02-06 DIAGNOSIS — R194 Change in bowel habit: Secondary | ICD-10-CM

## 2017-02-06 NOTE — Patient Instructions (Signed)
Please call if you have any concern with your GI tract, new GI symptoms.

## 2017-02-06 NOTE — Progress Notes (Signed)
Review of pertinent gastrointestinal problems: 1. Intermittent fecal soilage 12/2013; decreased anal tone resting and with valsalva on examination; symptoms completely resolved with Kegles BID and fiber supplements. 2. H/o colon polyps Colon resection surgically, long time ago for polyp that could not be removed endoscopically.. This was about 2007 years ago in [facility].Multiple colonoscopies, last was 2010;  Met Dr. Ardis Hughs 2014 (at age 63) recommended against further colonoscopies for routine surveillance or screening (age)   HPI: This is a very pleasant 83 year old man  I last saw him about 2 years ago.  He was sent by his PCP. I reviewed her note in which she discussed some change in his bowel habits however today he tells me that He is not having any problems with his bowels.  His eating habits are not great, having emotional trouble with wife who has altzheimers.  No overt bleeding.  No significant abd pains.  He had been talking to his PCP  Overall his weight has been down slightly (10 pounds).  Chief complaint is change in bowel habits  ROS: complete GI ROS as described in HPI.  Constitutional:  No unintentional weight loss   Past Medical History:  Diagnosis Date  . Abdominal aneurysm without mention of rupture   . Anginal pain ([place])    recent hospitalization  . Anxiety   . Arthritis   . Cancer [facility]) Jan. 2017   skin cancer removal/ Right medial lower Leg  . Colon polyps   . COPD (chronic obstructive pulmonary disease) ([place])   . Diverticulosis of colon (without mention of hemorrhage)   . H/O hiatal hernia   . Hyperlipidemia   . Inguinal hernia   . Insomnia, unspecified     Past Surgical History:  Procedure Laterality Date  . ABDOMINAL AORTIC ENDOVASCULAR STENT GRAFT N/A 10/29/2013   Procedure: ABDOMINAL AORTIC ENDOVASCULAR STENT GRAFT;  Surgeon: [name], MD;  Location: [place];  Service: Vascular;  Laterality: N/A;  . bilateral shoulder repair    .  CHOLECYSTECTOMY  1962  . COLON SURGERY  2007  . HERNIA REPAIR    . IR GENERIC HISTORICAL  12/24/2014   IR RADIOLOGIST EVAL & MGMT 12/24/2014 Jacqulynn Cadet, MD [facility] INTERV RAD  . MOHS SURGERY Right 11/2015   lower leg  . NASAL SEPTUM SURGERY  2011  . VASECTOMY  1962    Current Outpatient Prescriptions  Medication Sig Dispense Refill  . aspirin 81 MG tablet Take 81 mg by mouth daily.    . B Complex-C (SUPER B COMPLEX PO) Take 1 tablet by mouth daily.     . chlorpheniramine (CHLOR-TRIMETON) 4 MG tablet Take 4-8 mg by mouth every 6 (six) hours as needed for allergies.     . Cholecalciferol (VITAMIN D) 2000 UNITS CAPS Take 2,000 Units by mouth daily.     . Clotrimazole 1 % OINT Apply 1 application topically 2 (two) times daily. To left ear until resolved 56.7 g 1  . fexofenadine (ALLEGRA) 180 MG tablet Take 1 tablet (180 mg total) by mouth daily. 30 tablet 0  . Glucosamine-Chondroit-Vit C-Mn (GLUCOSAMINE CHONDR 1500 COMPLX PO) Take 1 capsule by mouth daily.    Marland Kitchen LORazepam (ATIVAN) 1 MG tablet TAKE 1.5 TABLETS BY MOUTH EVERY NIGHT AT BEDTIME 45 tablet 0  . Melatonin 3 MG TABS Take 2 tablets by mouth at bedtime.     . Multiple Vitamin (MULTIVITAMIN) tablet Take 1 tablet by mouth daily.    . niacin 100 MG tablet Take 250 mg  by mouth daily with breakfast.     . Omega-3 Fatty Acids (FISH OIL) 1000 MG CAPS Take 1 capsule by mouth daily.    Marland Kitchen OVER THE COUNTER MEDICATION Place 1 drop into both eyes every morning. Eyedrops    . simvastatin (ZOCOR) 40 MG tablet TAKE 1 TABLET BY MOUTH AT BEDTIME FOR CHOLESTEROL 90 tablet 0  . vitamin E 400 UNIT capsule Take 400 Units by mouth daily.     No current facility-administered medications for this visit.     Allergies as of 02/06/2017 - Review Complete 02/06/2017  Allergen Reaction Noted  . Celebrex [celecoxib] Other (See Comments) 07/10/2013  . Codeine Other (See Comments) 04/14/2013    Family History  Problem Relation Age of Onset  . Heart attack  Father 4  . Colon cancer Neg Hx     Social History   Social History  . Marital status: Married    Spouse name: N/A  . Number of children: N/A  . Years of education: N/A   Occupational History  . retired    Social History Main Topics  . Smoking status: Former Smoker    Packs/day: 0.75    Years: 25.00    Types: Cigarettes    Quit date: 10/23/1984  . Smokeless tobacco: Never Used  . Alcohol use 0.6 oz/week    1 Glasses of wine per week     Comment: social  . Drug use: No  . Sexual activity: No   Other Topics Concern  . Not on file   Social History Narrative  . No narrative on file     Physical Exam: BP 120/70   Pulse 72   Ht '5\' 8"'$  (1.727 m)   Wt 190 lb (86.2 kg)   BMI 28.89 kg/m  Constitutional: generally well-appearing Psychiatric: alert and oriented x3 Abdomen: soft, nontender, nondistended, no obvious ascites, no peritoneal signs, normal bowel sounds No peripheral edema noted in lower extremities  Assessment and plan: 83 y.o. male with Mild change in bowel habits  He probably has had some mild change in his bowel habits recently. He is not very alarmed by this. It does not look like he is having any overt bleeding, no significant abdominal pains. His CBC 3 months ago was normal. He is 83 years old and I recommended against any type of further GI testing unless he has significant further changes or significant new GI symptoms.  Please see the "Patient Instructions" section for addition details about the plan.  Owens Loffler, MD [facility] 02/06/2017, 3:24 PM

## 2017-02-07 ENCOUNTER — Encounter: Payer: Self-pay | Admitting: Family

## 2017-02-09 ENCOUNTER — Telehealth: Payer: Self-pay

## 2017-02-09 NOTE — Telephone Encounter (Signed)
Patient states he was told by the pharmacy that he is not due for Lorazepam until 02/15/17. Patient states he counted his pill and he will be 1 day short of medication and will need this filled on the 25th.  I tried to call patient to further discuss, patient did not answer and no voicemail. I will try to call patient again later

## 2017-02-10 ENCOUNTER — Other Ambulatory Visit: Payer: Self-pay | Admitting: Internal Medicine

## 2017-02-10 DIAGNOSIS — E785 Hyperlipidemia, unspecified: Secondary | ICD-10-CM

## 2017-02-12 ENCOUNTER — Other Ambulatory Visit: Payer: Self-pay | Admitting: Internal Medicine

## 2017-02-12 MED ORDER — LORAZEPAM 1 MG PO TABS: ORAL_TABLET | ORAL | 0 refills | Status: DC

## 2017-02-12 NOTE — Telephone Encounter (Signed)
Andrew Vega, pharmacist with [facility], called and stated that patient is due for his Lorazepam tomorrow and wants to know if it can be filled tomorrow due to patient will be out. Refill given.

## 2017-02-14 ENCOUNTER — Ambulatory Visit (INDEPENDENT_AMBULATORY_CARE_PROVIDER_SITE_OTHER): Payer: Medicare Other | Admitting: Family

## 2017-02-14 ENCOUNTER — Encounter: Payer: Self-pay | Admitting: Family

## 2017-02-14 ENCOUNTER — Ambulatory Visit (HOSPITAL_COMMUNITY)
Admission: RE | Admit: 2017-02-14 | Discharge: 2017-02-14 | Disposition: A | Discharge status: Home / Self Care | Payer: Medicare Other | Source: Ambulatory Visit | Attending: Family | Admitting: Family

## 2017-02-14 VITALS — BP 127/79 | HR 80 | Temp 98.5°F | Resp 20 | Ht 68.0 in | Wt 183.0 lb

## 2017-02-14 DIAGNOSIS — Z87891 Personal history of nicotine dependence: Secondary | ICD-10-CM

## 2017-02-14 DIAGNOSIS — I714 Abdominal aortic aneurysm, without rupture, unspecified: Secondary | ICD-10-CM

## 2017-02-14 DIAGNOSIS — Z95828 Presence of other vascular implants and grafts: Secondary | ICD-10-CM | POA: Diagnosis not present

## 2017-02-14 DIAGNOSIS — T82330D Leakage of aortic (bifurcation) graft (replacement), subsequent encounter: Secondary | ICD-10-CM | POA: Diagnosis not present

## 2017-02-14 DIAGNOSIS — I6523 Occlusion and stenosis of bilateral carotid arteries: Secondary | ICD-10-CM

## 2017-02-14 DIAGNOSIS — IMO0001 Reserved for inherently not codable concepts without codable children: Secondary | ICD-10-CM

## 2017-02-14 NOTE — Patient Instructions (Signed)
Stroke Prevention Some medical conditions and behaviors are associated with an increased chance of having a stroke. You may prevent a stroke by making healthy choices and managing medical conditions. How can I reduce my risk of having a stroke?  Stay physically active. Get at least 30 minutes of activity on most or all days.  Do not smoke. It may also be helpful to avoid exposure to secondhand smoke.  Limit alcohol use. Moderate alcohol use is considered to be:  No more than 2 drinks per day for men.  No more than 1 drink per day for nonpregnant women.  Eat healthy foods. This involves:  Eating 5 or more servings of fruits and vegetables a day.  Making dietary changes that address high blood pressure (hypertension), high cholesterol, diabetes, or obesity.  Manage your cholesterol levels.  Making food choices that are high in fiber and low in saturated fat, trans fat, and cholesterol may control cholesterol levels.  Take any prescribed medicines to control cholesterol as directed by your health care provider.  Manage your diabetes.  Controlling your carbohydrate and sugar intake is recommended to manage diabetes.  Take any prescribed medicines to control diabetes as directed by your health care provider.  Control your hypertension.  Making food choices that are low in salt (sodium), saturated fat, trans fat, and cholesterol is recommended to manage hypertension.  Ask your health care provider if you need treatment to lower your blood pressure. Take any prescribed medicines to control hypertension as directed by your health care provider.  If you are 18-39 years of age, have your blood pressure checked every 3-5 years. If you are 40 years of age or older, have your blood pressure checked every year.  Maintain a healthy weight.  Reducing calorie intake and making food choices that are low in sodium, saturated fat, trans fat, and cholesterol are recommended to manage  weight.  Stop drug abuse.  Avoid taking birth control pills.  Talk to your health care provider about the risks of taking birth control pills if you are over 35 years old, smoke, get migraines, or have ever had a blood clot.  Get evaluated for sleep disorders (sleep apnea).  Talk to your health care provider about getting a sleep evaluation if you snore a lot or have excessive sleepiness.  Take medicines only as directed by your health care provider.  For some people, aspirin or blood thinners (anticoagulants) are helpful in reducing the risk of forming abnormal blood clots that can lead to stroke. If you have the irregular heart rhythm of atrial fibrillation, you should be on a blood thinner unless there is a good reason you cannot take them.  Understand all your medicine instructions.  Make sure that other conditions (such as anemia or atherosclerosis) are addressed. Get help right away if:  You have sudden weakness or numbness of the face, arm, or leg, especially on one side of the body.  Your face or eyelid droops to one side.  You have sudden confusion.  You have trouble speaking (aphasia) or understanding.  You have sudden trouble seeing in one or both eyes.  You have sudden trouble walking.  You have dizziness.  You have a loss of balance or coordination.  You have a sudden, severe headache with no known cause.  You have new chest pain or an irregular heartbeat. Any of these symptoms may represent a serious problem that is an emergency. Do not wait to see if the symptoms will go away.   Get medical help at once. Call your local emergency services (911 in [place] Do not drive yourself to the hospital. This information is not intended to replace advice given to you by your health care provider. Make sure you discuss any questions you have with your health care provider. Document Released: 11/16/2004 Document Revised: 03/16/2016 Document Reviewed: 04/11/2013 Elsevier  Interactive Patient Education  2017 Elsevier Inc.  

## 2017-02-14 NOTE — Progress Notes (Signed)
[facility] [place]  CC: Follow up s/p Endovascular Repair of Abdominal Aortic Aneurysm   History of Present Illness  Andrew Vega is a 83 y.o. (1925-11-15) male patient of Dr. Trula Slade who is back today for postoperative EVAR surveillance. 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 Maximum aortic size was 5.3 cm. Dr. Trula Slade placed a proximal extension for a type I endoleak. This was originally scheduled to be performed by Dr. Kellie Simmering, however he had a family emergency and therefore Dr. Trula Slade did the procedure. The patient's postoperative course was uncomplicated.   He had a type II endoleak which was associated with aneurysm sac expansion. Therefore he underwent embolization in March 2016. He is here for follow-up of his abdominal aneurysm as well as his carotid occlusive disease which is asymptomatic.  Pt denies any history of stroke or TIA.  He denies claudication sx's with walking.  Pt Diabetic: No Pt smoker: former smoker, quit in 1986   Past Medical History:  Diagnosis Date  . Abdominal aneurysm without mention of rupture   . Anginal pain ([place])    recent hospitalization  . Anxiety   . Arthritis   . Cancer [facility]) Jan. 2017   skin cancer removal/ Right medial lower Leg  . Colon polyps   . COPD (chronic obstructive pulmonary disease) ([place])   . Diverticulosis of colon (without mention of hemorrhage)   . H/O hiatal hernia   . Hyperlipidemia   . Inguinal hernia   . Insomnia, unspecified    Past Surgical History:  Procedure Laterality Date  . ABDOMINAL AORTIC ENDOVASCULAR STENT GRAFT N/A 10/29/2013   Procedure: ABDOMINAL AORTIC ENDOVASCULAR STENT GRAFT;  Surgeon: Andrew Mitchell, MD;  Location: [place];  Service: Vascular;  Laterality: N/A;  . bilateral shoulder repair    . CHOLECYSTECTOMY  1962  . COLON SURGERY  2007  . HERNIA REPAIR    . IR GENERIC HISTORICAL  12/24/2014   IR RADIOLOGIST EVAL & MGMT 12/24/2014  Andrew Cadet, MD [facility] INTERV RAD  . MOHS SURGERY Right 11/2015   lower leg  . NASAL SEPTUM SURGERY  2011  . [place] History Social History  Substance Use Topics  . Smoking status: Former Smoker    Packs/day: 0.75    Years: 25.00    Types: Cigarettes    Quit date: 10/23/1984  . Smokeless tobacco: Never Used  . Alcohol use 0.6 oz/week    1 Glasses of wine per week     Comment: social   Family History Family History  Problem Relation Age of Onset  . Heart attack Father 12  . Colon cancer Neg Hx    Current Outpatient Prescriptions on File Prior to Visit  Medication Sig Dispense Refill  . aspirin 81 MG tablet Take 81 mg by mouth daily.    . B Complex-C (SUPER B COMPLEX PO) Take 1 tablet by mouth daily.     . chlorpheniramine (CHLOR-TRIMETON) 4 MG tablet Take 4-8 mg by mouth every 6 (six) hours as needed for allergies.     . Cholecalciferol (VITAMIN D) 2000 UNITS CAPS Take 2,000 Units by mouth daily.     . Clotrimazole 1 % OINT Apply 1 application topically 2 (two) times daily. To left ear until resolved 56.7 g 1  . fexofenadine (ALLEGRA) 180 MG tablet Take 1 tablet (180 mg total) by mouth daily. 30 tablet 0  . Glucosamine-Chondroit-Vit C-Mn (GLUCOSAMINE CHONDR 1500 COMPLX PO) Take 1  capsule by mouth daily.    Marland Kitchen LORazepam (ATIVAN) 1 MG tablet TAKE 1.5 TABLETS BY MOUTH EVERY NIGHT AT BEDTIME 45 tablet 0  . Melatonin 3 MG TABS Take 2 tablets by mouth at bedtime.     . Multiple Vitamin (MULTIVITAMIN) tablet Take 1 tablet by mouth daily.    . niacin 100 MG tablet Take 250 mg by mouth daily with breakfast.     . Omega-3 Fatty Acids (FISH OIL) 1000 MG CAPS Take 1 capsule by mouth daily.    Marland Kitchen OVER THE COUNTER MEDICATION Place 1 drop into both eyes every morning. Eyedrops    . simvastatin (ZOCOR) 40 MG tablet TAKE 1 TABLET BY MOUTH AT BEDTIME FOR CHOLESTEROL 90 tablet 1  . vitamin E 400 UNIT capsule Take 400 Units by mouth daily.     No current facility-administered  medications on file prior to visit.    Allergies  Allergen Reactions  . Celebrex [Celecoxib] Other (See Comments)    Raised B/P   . Codeine Other (See Comments)    Severe stomach pain      ROS: See HPI for pertinent positives and negatives.  Physical Examination  Vitals:   02/14/17 1033  BP: 127/79  Pulse: 80  Resp: 20  Temp: 98.5 F (36.9 C)  TempSrc: Oral  SpO2: 96%  Weight: 183 lb (83 kg)  Height: 5\' 8"  (1.727 m)   Body mass index is 27.83 kg/m.  General: A&O x 3, WD, male.  Pulmonary: Sym exp, respirations are non labored, good air movt, CTAB  Cardiac: RRR, Nl S1, S2, + murmur   Vascular: Vessel Right Left  Radial Palpable Palpable  Brachial Palpable Palpable  Carotid without bruit without bruit  Aorta Not palpable N/A  Femoral Palpable Palpable  Popliteal Not palpable Not palpable  PT Palpable Palpable  DP Palpable Palpable   Gastrointestinal: soft, NTND, -G/R, - HSM, - palpable masses, - CVAT B.  Musculoskeletal: M/S 5/5 throughout, extremities without ischemic changes.  Neurologic: Pain and light touch intact in extremities, Motor exam as listed above. CN 2-12 intact except for hard of hearing    Non-Invasive Vascular Imaging  EVAR Duplex (Date: 02/14/17)  AAA sac size: 5.71 cm x 5.53 cm; Right CIA: 1.59 cm; Left CIA: 1.85 cm          (06-21-16: 5.8 cm x 5.6 cm)  no endoleak detected  CTA Abd/Pelvis (Date: 06-09-15) The previously seen type 2 endoleak has been resolved after embolism therapy. Maximal sac diameter has diminished from 5.9 cm to 5.5 cm. Bilateral common iliac artery aneurysm sacs remained excluded and without endoleak. They are not significantly changed.  06-21-16 Carotid Duplex:  1-39% stenosis of the bilateral ICA. The left ICA appears less stenotic than the previous duplex on 12/13/15 with the right ICA remaining stable.    Medical Decision Making  Andrew Vega is a 83 y.o. male (Date01/04/2014). He had a type  II endoleak which was associated with aneurysm sac expansion. Therefore he underwent embolization in March 2016.  Pt is asymptomatic with decreased sac size compared to duplex on 06-21-16.   He has no hx of stroke or TIA.       I discussed with the patient the importance of surveillance of the endograft.  The next endograft duplex will be scheduled for 1 year, will also schedule carotid duplex at that time.  I emphasized the importance of maximal medical management including strict control of blood pressure, blood glucose, and lipid levels, antiplatelet  agents, obtaining regular exercise, and cessation of smoking.   Thank you for allowing [place] to participate in this patient's care.  Clemon Chambers, RN, MSN, FNP-C [facility] [place] Office: [phone]  Clinic Physician: Scot Dock  02/14/2017, 10:37 AM

## 2017-02-15 ENCOUNTER — Ambulatory Visit (INDEPENDENT_AMBULATORY_CARE_PROVIDER_SITE_OTHER): Payer: Medicare Other | Admitting: Internal Medicine

## 2017-02-15 ENCOUNTER — Encounter: Payer: Self-pay | Admitting: Internal Medicine

## 2017-02-15 VITALS — BP 146/72 | HR 82 | Temp 97.8°F | Resp 14 | Ht 68.0 in | Wt 188.4 lb

## 2017-02-15 DIAGNOSIS — L219 Seborrheic dermatitis, unspecified: Secondary | ICD-10-CM | POA: Diagnosis not present

## 2017-02-15 DIAGNOSIS — I714 Abdominal aortic aneurysm, without rupture, unspecified: Secondary | ICD-10-CM

## 2017-02-15 DIAGNOSIS — J301 Allergic rhinitis due to pollen: Secondary | ICD-10-CM

## 2017-02-15 DIAGNOSIS — I6523 Occlusion and stenosis of bilateral carotid arteries: Secondary | ICD-10-CM

## 2017-02-15 DIAGNOSIS — Z23 Encounter for immunization: Secondary | ICD-10-CM

## 2017-02-15 DIAGNOSIS — G4700 Insomnia, unspecified: Secondary | ICD-10-CM | POA: Diagnosis not present

## 2017-02-15 DIAGNOSIS — M5030 Other cervical disc degeneration, unspecified cervical region: Secondary | ICD-10-CM | POA: Diagnosis not present

## 2017-02-15 MED ORDER — KETOCONAZOLE 2 % EX CREA: 1.0000 "application " | TOPICAL_CREAM | Freq: Every day | CUTANEOUS | 3 refills | Status: DC

## 2017-02-15 MED ORDER — ZOSTER VAC RECOMB ADJUVANTED 50 MCG/0.5ML IM SUSR: 0.5000 mL | Freq: Once | INTRAMUSCULAR | 1 refills | Status: AC

## 2017-02-15 MED FILL — SHINGRIX 50 MCG SUS: 50 | 1 days supply | Qty: 1 | Fill #0

## 2017-02-15 NOTE — Addendum Note (Signed)
Addended by: Lianne Cure A on: 02/15/2017 08:30 AM   Modules accepted: Orders

## 2017-02-15 NOTE — Progress Notes (Addendum)
Location:  [facility] clinic Provider:  Shafer Swamy L. Mariea Clonts, D.O., C.M.D.  Code Status: DNR Goals of Care:  Advanced Directives 02/15/2017  Does Patient Have a Medical Advance Directive? Yes  Type of Paramedic of [place];Living will  Does patient want to make changes to medical advance directive? -  Copy of [place] in Chart? Yes  Pre-existing out of facility DNR order (yellow form or pink MOST form) -   Chief Complaint  Patient presents with  . Medical Management of Chronic Issues    3 month follow up     HPI: Patient is a 83 y.o. Vega seen today for medical management of chronic diseases.    Had his f/u about his AAA--no leaks or problems recently.    Cervical DDD:  Sometimes stiff when he turns left and other times, it's fine.    Left ear ointment is not really doing the trick.  Has to keep putting hearing aide in there.  Allergies under control with fexofenadine.    Due to his anxiety, he's on lorazepam.  He was short one pill.  He was able to get the medication after a delay--related to length of month.      Still doing research about moving [place].  Susan's daughter was upset about his thought of moving [place] and Susan's terminal dementia.  She was upset that she would need to start looking for memory care for Manuela Schwartz.    Past Medical History:  Diagnosis Date  . Abdominal aneurysm without mention of rupture   . Anginal pain ([place])    recent hospitalization  . Anxiety   . Arthritis   . Cancer [facility]) Jan. 2017   skin cancer removal/ Right medial lower Leg  . Colon polyps   . COPD (chronic obstructive pulmonary disease) ([place])   . Diverticulosis of colon (without mention of hemorrhage)   . H/O hiatal hernia   . Hyperlipidemia   . Inguinal hernia   . Insomnia, unspecified     Past Surgical History:  Procedure Laterality Date  . ABDOMINAL AORTIC ENDOVASCULAR STENT GRAFT N/A 10/29/2013   Procedure: ABDOMINAL AORTIC ENDOVASCULAR STENT  GRAFT;  Surgeon: [name], MD;  Location: [place];  Service: Vascular;  Laterality: N/A;  . bilateral shoulder repair    . CHOLECYSTECTOMY  1962  . COLON SURGERY  2007  . HERNIA REPAIR    . IR GENERIC HISTORICAL  12/24/2014   IR RADIOLOGIST EVAL & MGMT 12/24/2014 Jacqulynn Cadet, MD [facility] INTERV RAD  . MOHS SURGERY Right 11/2015   lower leg  . NASAL SEPTUM SURGERY  2011  . VASECTOMY  1962    Allergies  Allergen Reactions  . Celebrex [Celecoxib] Other (See Comments)    Raised B/P   . Codeine Other (See Comments)    Severe stomach pain     Allergies as of 02/15/2017      Reactions   Celebrex [celecoxib] Other (See Comments)   Raised B/P    Codeine Other (See Comments)   Severe stomach pain       Medication List       Accurate as of 02/15/17 10:59 AM. Always use your most recent med list.          aspirin 81 MG tablet Take 81 mg by mouth daily.   CHLOR-TRIMETON 4 MG tablet Generic drug:  chlorpheniramine Take 4-8 mg by mouth every 6 (six) hours as needed for allergies.   Clotrimazole 1 % Oint Apply  1 application topically 2 (two) times daily. To left ear until resolved   fexofenadine 180 MG tablet Commonly known as:  ALLEGRA Take 1 tablet (180 mg total) by mouth daily.   Fish Oil 1000 MG Caps Take 1 capsule by mouth daily.   GLUCOSAMINE CHONDR 1500 COMPLX PO Take 1 capsule by mouth daily.   LORazepam 1 MG tablet Commonly known as:  ATIVAN TAKE 1.5 TABLETS BY MOUTH EVERY NIGHT AT BEDTIME   Melatonin 3 MG Tabs Take 2 tablets by mouth at bedtime.   multivitamin tablet Take 1 tablet by mouth daily.   niacin 100 MG tablet Take 250 mg by mouth daily with breakfast.   OVER THE COUNTER MEDICATION Place 1 drop into both eyes every morning. Eyedrops   simvastatin 40 MG tablet Commonly known as:  ZOCOR TAKE 1 TABLET BY MOUTH AT BEDTIME FOR CHOLESTEROL   SUPER B COMPLEX PO Take 1 tablet by mouth daily.   Vitamin D 2000 units Caps Take 2,000 Units by  mouth daily.   vitamin E 400 UNIT capsule Take 400 Units by mouth daily.       Review of Systems:  Review of Systems  Constitutional: Negative for chills, fever and malaise/fatigue.  HENT: Negative for congestion and hearing loss.   Eyes: Negative for blurred vision.  Respiratory: Negative for cough and shortness of breath.   Cardiovascular: Negative for chest pain, palpitations and leg swelling.  Gastrointestinal: Negative for abdominal pain, blood in stool, constipation and melena.  Genitourinary: Negative for dysuria.  Musculoskeletal: Positive for neck pain. Negative for falls.  Skin: Negative for itching and rash.  Neurological: Negative for dizziness and weakness.  Endo/Heme/Allergies: Does not bruise/bleed easily.  Psychiatric/Behavioral: Positive for memory loss. Negative for depression. The patient is nervous/anxious.     Health Maintenance  Topic Date Due  . INFLUENZA VACCINE  05/23/2017  . TETANUS/TDAP  10/23/2021  . PNA vac Low Risk Adult  Completed    Physical Exam: Vitals:   02/15/17 1025  BP: (!) 146/72  Pulse: 82  Resp: 14  Temp: 97.8 F (36.6 C)  TempSrc: Oral  SpO2: 97%  Weight: 188 lb 6.4 oz (85.5 kg)  Height: 5\' 8"  (1.727 m)   Body mass index is 28.65 kg/m. Physical Exam  Constitutional: He is oriented to person, place, and time. He appears well-developed and well-nourished. No distress.  Cardiovascular: Normal rate, regular rhythm, normal heart sounds and intact distal pulses.   Pulmonary/Chest: Effort normal and breath sounds normal. No respiratory distress.  Abdominal: Soft. Bowel sounds are normal. He exhibits no distension. There is no tenderness.  Musculoskeletal: Normal range of motion.  Neurological: He is alert and oriented to person, place, and time.  Skin: Skin is warm and dry.  Left ear with dried blood and scaly skin on it  Psychiatric: He has a normal mood and affect.    Labs reviewed: Basic Metabolic Panel:  Recent Labs   05/03/16 0832 11/14/16 0941  NA 141 141  K 4.2 4.8  CL 107 108  CO2 25 25  GLUCOSE 91 108*  BUN 19 21  CREATININE 1.56* 1.64*  CALCIUM 8.6 9.3   Liver Function Tests:  Recent Labs  05/03/16 0832 11/14/16 0941  AST 24 23  ALT 22 19  ALKPHOS 64 55  BILITOT 0.4 0.5  PROT 6.8 7.0  ALBUMIN 3.9 3.8   No results for input(s): LIPASE, AMYLASE in the last 8760 hours. No results for input(s): AMMONIA in the last 8760  hours. CBC:  Recent Labs  05/03/16 0832 11/14/16 0941  WBC 5.9 6.5  NEUTROABS 3,068 3,510  HGB 12.8* 13.8  HCT 37.9* 40.8  MCV 93.6 93.4  PLT 156 180   Lipid Panel:  Recent Labs  05/03/16 0832 11/14/16 0941  CHOL 140 154  HDL Andrew 40*  LDLCALC 76 79  TRIG 117 175*  CHOLHDL 3.4 3.9   Assessment/Plan 1. Seborrheic dermatitis - d/c clotrimazole which has not worked, try ketoconazole - ketoconazole (NIZORAL) 2 % cream; Apply 1 application topically daily.  Dispense: 30 g; Refill: 3  2. Abdominal aortic aneurysm (AAA) without rupture ([place]) - just had f/u visit and imaging and stable - CBC with Differential/Platelet; Future - COMPLETE METABOLIC PANEL WITH GFR; Future  3. Non-seasonal allergic rhinitis due to pollen - cont his regular allergy medications which are working well - CBC with Differential/Platelet; Future - COMPLETE METABOLIC PANEL WITH GFR; Future  4. DDD (degenerative disc disease), cervical - stable also, has good and bad days, no major exacerbations, has exercises he does - CBC with Differential/Platelet; Future - COMPLETE METABOLIC PANEL WITH GFR; Future  5. Insomnia, unspecified type - stable with ativan, I've tried to get him off of this to no avail - CBC with Differential/Platelet; Future - COMPLETE METABOLIC PANEL WITH GFR; Future  7.Need for shingles vaccine -referred to [facility] for shingrix #1 today, next will be at 3 mos  Labs/tests ordered:   Orders Placed This Encounter  Procedures  . CBC with  Differential/Platelet    Standing Status:   Future    Standing Expiration Date:   10/17/2017  . COMPLETE METABOLIC PANEL WITH GFR    Standing Status:   Future    Standing Expiration Date:   10/17/2017   Next appt:  07/02/2017   Yahir Tavano L. Mordechai Matuszak, D.O. [facility] [address], [place] [address] Cell Phone (Mon-Fri 8am-5pm):  [phone] On Call:  [phone] & follow prompts after 5pm & weekends Office Phone:  [phone] Office Fax:  [phone]

## 2017-02-22 ENCOUNTER — Ambulatory Visit (INDEPENDENT_AMBULATORY_CARE_PROVIDER_SITE_OTHER): Payer: Medicare Other | Admitting: Podiatry

## 2017-02-22 ENCOUNTER — Encounter: Payer: Self-pay | Admitting: Podiatry

## 2017-02-22 DIAGNOSIS — B351 Tinea unguium: Secondary | ICD-10-CM | POA: Diagnosis not present

## 2017-02-22 DIAGNOSIS — M79675 Pain in left toe(s): Secondary | ICD-10-CM | POA: Diagnosis not present

## 2017-02-22 NOTE — Progress Notes (Signed)
Patient ID: Andrew Vega, male   DOB: 05-14-1926, 83 y.o.   MRN: [id number] Complaint:  Visit Type: Patient returns to my office for pain noted on the third and fourth toes of the left foot. Patient has had a distal clavi. Of the third toe, which was treated through this office. He says that the nail from the fourth toe was now rubbing into the third toe causing pain and discomfort as he walks and wears his shoes. He presents the office today for continued evaluation and treatment  Podiatric Exam: Vascular: dorsalis pedis and posterior tibial pulses are palpable bilateral. Capillary return is immediate. Temperature gradient is WNL. Skin turgor WNL  Sensorium: Normal Semmes Weinstein monofilament test. Normal tactile sensation bilaterally. Nail Exam: Pt has thick disfigured discolored nails with subungual debris noted bilateral entire nail hallux  Ulcer Exam: There is no evidence of ulcer or pre-ulcerative changes or infection. Orthopedic Exam: Muscle tone and strength are WNL. No limitations in general ROM. No crepitus or effusions noted. Foot type and digits show no abnormalities. Bony prominences are unremarkable. Skin: No Porokeratosis. No infection or ulcers.  Heloma durum fifth toe right foot asymptomatic. Distal clavi 3rd left  Diagnosis:  Tinea unguium, Pain in right toe, pain in left toes clavi  Treatment & Plan Procedures and Treatment: Consent by patient was obtained for treatment procedures. The patient understood the discussion of treatment and procedures well. All questions were answered thoroughly reviewed. Debridement of mycotic and hypertrophic toenails, 1 through 5 bilateral and clearing of subungual debris. No ulceration, no infection noted. Debride clavi Return Visit-Office Procedure: Patient instructed to return to the office for a follow up visit 3 months for continued evaluation and treatment.  Gardiner Barefoot DPM

## 2017-02-23 ENCOUNTER — Emergency Department (HOSPITAL_COMMUNITY): Payer: Medicare Other

## 2017-02-23 ENCOUNTER — Encounter (HOSPITAL_COMMUNITY): Payer: Self-pay | Admitting: Emergency Medicine

## 2017-02-23 ENCOUNTER — Emergency Department (HOSPITAL_COMMUNITY)
Admission: EM | Admit: 2017-02-23 | Discharge: 2017-02-23 | Disposition: A | Discharge status: Home / Self Care | Payer: Medicare Other | Attending: Emergency Medicine | Admitting: Emergency Medicine

## 2017-02-23 DIAGNOSIS — Z79899 Other long term (current) drug therapy: Secondary | ICD-10-CM | POA: Insufficient documentation

## 2017-02-23 DIAGNOSIS — Z87891 Personal history of nicotine dependence: Secondary | ICD-10-CM | POA: Diagnosis not present

## 2017-02-23 DIAGNOSIS — R42 Dizziness and giddiness: Secondary | ICD-10-CM | POA: Diagnosis not present

## 2017-02-23 DIAGNOSIS — Z85828 Personal history of other malignant neoplasm of skin: Secondary | ICD-10-CM | POA: Insufficient documentation

## 2017-02-23 DIAGNOSIS — Z7982 Long term (current) use of aspirin: Secondary | ICD-10-CM | POA: Insufficient documentation

## 2017-02-23 DIAGNOSIS — I6789 Other cerebrovascular disease: Secondary | ICD-10-CM | POA: Diagnosis not present

## 2017-02-23 DIAGNOSIS — Z5181 Encounter for therapeutic drug level monitoring: Secondary | ICD-10-CM | POA: Insufficient documentation

## 2017-02-23 DIAGNOSIS — J449 Chronic obstructive pulmonary disease, unspecified: Secondary | ICD-10-CM | POA: Insufficient documentation

## 2017-02-23 DIAGNOSIS — R2689 Other abnormalities of gait and mobility: Secondary | ICD-10-CM | POA: Diagnosis not present

## 2017-02-23 LAB — COMPREHENSIVE METABOLIC PANEL
ALK PHOS: 58 U/L (ref 38–126)
ALT: 20 U/L (ref 17–63)
AST: 28 U/L (ref 15–41)
Albumin: 3.8 g/dL (ref 3.5–5.0)
Anion gap: 8 (ref 5–15)
BILIRUBIN TOTAL: 0.9 mg/dL (ref 0.3–1.2)
BUN: 21 mg/dL — ABNORMAL HIGH (ref 6–20)
CALCIUM: 9.3 mg/dL (ref 8.9–10.3)
CHLORIDE: 110 mmol/L (ref 101–111)
CO2: 22 mmol/L (ref 22–32)
CREATININE: 1.71 mg/dL — AB (ref 0.61–1.24)
GFR, EST AFRICAN AMERICAN: 39 mL/min — AB (ref >60)
GFR, EST NON AFRICAN AMERICAN: 34 mL/min — AB (ref >60)
Glucose, Bld: 127 mg/dL — ABNORMAL HIGH (ref 65–99)
Potassium: 3.9 mmol/L (ref 3.5–5.1)
Sodium: 140 mmol/L (ref 135–145)
Total Protein: 7 g/dL (ref 6.5–8.1)

## 2017-02-23 LAB — RAPID URINE DRUG SCREEN, HOSP PERFORMED
Amphetamines: NOT DETECTED
Barbiturates: NOT DETECTED
Benzodiazepines: POSITIVE — AB
COCAINE: NOT DETECTED
OPIATES: NOT DETECTED
TETRAHYDROCANNABINOL: NOT DETECTED

## 2017-02-23 LAB — URINALYSIS, ROUTINE W REFLEX MICROSCOPIC
BACTERIA UA: NONE SEEN
BILIRUBIN URINE: NEGATIVE
Glucose, UA: NEGATIVE mg/dL
Hgb urine dipstick: NEGATIVE
KETONES UR: NEGATIVE mg/dL
LEUKOCYTES UA: NEGATIVE
Nitrite: NEGATIVE
PROTEIN: 30 mg/dL — AB
Specific Gravity, Urine: 1.017 (ref 1.005–1.030)
pH: 5 (ref 5.0–8.0)

## 2017-02-23 LAB — I-STAT CHEM 8, ED
BUN: 28 mg/dL — ABNORMAL HIGH (ref 6–20)
CREATININE: 1.6 mg/dL — AB (ref 0.61–1.24)
Calcium, Ion: 1.18 mmol/L (ref 1.15–1.40)
Chloride: 107 mmol/L (ref 101–111)
GLUCOSE: 129 mg/dL — AB (ref 65–99)
HCT: 33 % — ABNORMAL LOW (ref 39.0–52.0)
Hemoglobin: 11.2 g/dL — ABNORMAL LOW (ref 13.0–17.0)
POTASSIUM: 4.6 mmol/L (ref 3.5–5.1)
Sodium: 144 mmol/L (ref 135–145)
TCO2: 26 mmol/L (ref 0–100)

## 2017-02-23 LAB — CBC WITH DIFFERENTIAL/PLATELET
BASOS PCT: 1 %
Basophils Absolute: 0.1 10*3/uL (ref 0.0–0.1)
EOS ABS: 0.3 10*3/uL (ref 0.0–0.7)
Eosinophils Relative: 4 %
HCT: 38.2 % — ABNORMAL LOW (ref 39.0–52.0)
HEMOGLOBIN: 13.1 g/dL (ref 13.0–17.0)
Lymphocytes Relative: 30 %
Lymphs Abs: 1.9 10*3/uL (ref 0.7–4.0)
MCH: 31.5 pg (ref 26.0–34.0)
MCHC: 34.3 g/dL (ref 30.0–36.0)
MCV: 91.8 fL (ref 78.0–100.0)
Monocytes Absolute: 0.4 10*3/uL (ref 0.1–1.0)
Monocytes Relative: 7 %
NEUTROS PCT: 58 %
Neutro Abs: 3.7 10*3/uL (ref 1.7–7.7)
Platelets: 172 10*3/uL (ref 150–400)
RBC: 4.16 MIL/uL — AB (ref 4.22–5.81)
RDW: 14.2 % (ref 11.5–15.5)
WBC: 6.4 10*3/uL (ref 4.0–10.5)

## 2017-02-23 LAB — I-STAT TROPONIN, ED: TROPONIN I, POC: 0 ng/mL (ref 0.00–0.08)

## 2017-02-23 LAB — LIPASE, BLOOD: LIPASE: 26 U/L (ref 11–51)

## 2017-02-23 LAB — APTT: APTT: 28 s (ref 24–36)

## 2017-02-23 LAB — ETHANOL: Alcohol, Ethyl (B): <5 mg/dL (ref <5)

## 2017-02-23 LAB — PROTIME-INR
INR: 1.16
Prothrombin Time: 14.9 seconds (ref 11.4–15.2)

## 2017-02-23 MED ORDER — ONDANSETRON HCL 4 MG/2ML IJ SOLN
4.0000 mg | Freq: Once | INTRAMUSCULAR | Status: AC
  Administered 2017-02-23: 4 mg via INTRAVENOUS
  Filled 2017-02-23: qty 2

## 2017-02-23 MED ORDER — SODIUM CHLORIDE 0.9 % IV BOLUS (SEPSIS)
1000.0000 mL | Freq: Once | INTRAVENOUS | Status: AC
  Administered 2017-02-23: 1000 mL via INTRAVENOUS

## 2017-02-23 MED ORDER — MECLIZINE HCL 25 MG PO TABS
25.0000 mg | ORAL_TABLET | Freq: Once | ORAL | Status: AC
  Administered 2017-02-23: 25 mg via ORAL
  Filled 2017-02-23: qty 1

## 2017-02-23 MED ORDER — SODIUM CHLORIDE 0.9 % IV BOLUS (SEPSIS): 500.0000 mL | Freq: Once | INTRAVENOUS | Status: DC

## 2017-02-23 MED ORDER — MECLIZINE HCL 25 MG PO TABS: 25.0000 mg | ORAL_TABLET | Freq: Three times a day (TID) | ORAL | 0 refills | Status: DC | PRN

## 2017-02-23 NOTE — ED Notes (Signed)
Dr. Shon Hale advised Code Stroke can be cancelled

## 2017-02-23 NOTE — ED Notes (Signed)
ACTIVATED CODE STROKE @ 11:17 AM

## 2017-02-23 NOTE — ED Notes (Signed)
Pt stable and ambulatory, was able to walk independently down the hallway without difficulty, denies SOB or dizziness

## 2017-02-23 NOTE — ED Provider Notes (Signed)
[place] DEPT Provider Note   CSN: [id number] Arrival date & time: 02/23/17  1013     History   Chief Complaint Chief Complaint  Patient presents with  . Dizziness  . Nausea  . Emesis    HPI Andrew Vega is a 83 y.o. male.  The history is provided by the patient.  Dizziness  Quality:  Imbalance Severity:  Moderate Onset quality:  Gradual Duration:  2 hours Timing:  Constant Progression:  Worsening Chronicity:  New Context: eye movement, head movement and standing up   Relieved by:  Closing eyes Worsened by:  Movement, standing up, turning head and eye movement Associated symptoms: diarrhea (loose bm), nausea and vomiting   Emesis   Associated symptoms include diarrhea (loose bm).    Past Medical History:  Diagnosis Date  . Abdominal aneurysm without mention of rupture   . Anginal pain ([place])    recent hospitalization  . Anxiety   . Arthritis   . Cancer [facility]) Jan. 2017   skin cancer removal/ Right medial lower Leg  . Colon polyps   . COPD (chronic obstructive pulmonary disease) ([place])   . Diverticulosis of colon (without mention of hemorrhage)   . H/O hiatal hernia   . Hyperlipidemia   . Inguinal hernia   . Insomnia, unspecified     Patient Active Problem List   Diagnosis Date Noted  . Change in bowel habits 11/16/2016  . History of colonic polyps 11/16/2016  . Bilateral impacted cerumen 11/16/2016  . Anemia 05/08/2016  . DDD (degenerative disc disease), cervical 03/09/2016  . Postural tremor 03/09/2016  . Endoleak post (EVAR) endovascular aneurysm repair ([place]) 01/08/2015  . Abdominal aortic aneurysm ([place])   . Occlusion and stenosis of carotid artery without mention of cerebral infarction 06/01/2014  . Fall in elderly patient 03/30/2014  . Loose stools 03/30/2014  . Closed fracture of proximal phalanx of right hand 03/30/2014  . Multiple fractures of ribs of left side 03/30/2014  . Chronic cough 01/13/2014  . Abdominal aneurysm without mention  of rupture 12/01/2013  . Anxiety state 11/19/2013  . Hyperlipidemia 09/03/2013  . Insomnia 03/20/2013  . Allergic rhinitis 03/20/2013    Past Surgical History:  Procedure Laterality Date  . ABDOMINAL AORTIC ENDOVASCULAR STENT GRAFT N/A 10/29/2013   Procedure: ABDOMINAL AORTIC ENDOVASCULAR STENT GRAFT;  Surgeon: [name], MD;  Location: [place];  Service: Vascular;  Laterality: N/A;  . bilateral shoulder repair    . CHOLECYSTECTOMY  1962  . COLON SURGERY  2007  . HERNIA REPAIR    . IR GENERIC HISTORICAL  12/24/2014   IR RADIOLOGIST EVAL & MGMT 12/24/2014 Jacqulynn Cadet, MD [facility] INTERV RAD  . MOHS SURGERY Right 11/2015   lower leg  . NASAL SEPTUM SURGERY  2011  . [place] Medications    Prior to Admission medications   Medication Sig Start Date End Date Taking? Authorizing Provider  aspirin 81 MG tablet Take 81 mg by mouth daily.   Yes Historical Provider, MD  B Complex-C (SUPER B COMPLEX PO) Take 1 tablet by mouth daily.    Yes Historical Provider, MD  chlorpheniramine (CHLOR-TRIMETON) 4 MG tablet Take 4-8 mg by mouth every 6 (six) hours as needed for allergies.    Yes Historical Provider, MD  Cholecalciferol (VITAMIN D) 2000 UNITS CAPS Take 2,000 Units by mouth daily.    Yes Historical Provider, MD  fexofenadine (ALLEGRA) 180 MG tablet Take 1 tablet (180 mg  total) by mouth daily. 12/03/14  Yes Tiffany L Reed, DO  Glucosamine-Chondroit-Vit C-Mn (GLUCOSAMINE CHONDR 1500 COMPLX PO) Take 1 capsule by mouth daily.   Yes Historical Provider, MD  ketoconazole (NIZORAL) 2 % cream Apply 1 application topically daily. 02/15/17  Yes Tiffany L Reed, DO  LORazepam (ATIVAN) 1 MG tablet TAKE 1.5 TABLETS BY MOUTH EVERY NIGHT AT BEDTIME 02/12/17  Yes Tiffany L Reed, DO  Melatonin 3 MG TABS Take 2 tablets by mouth at bedtime.    Yes Historical Provider, MD  Multiple Vitamin (MULTIVITAMIN) tablet Take 1 tablet by mouth daily.   Yes Historical Provider, MD  niacin 100 MG tablet  Take 250 mg by mouth daily with breakfast.    Yes Historical Provider, MD  Omega-3 Fatty Acids (FISH OIL) 1000 MG CAPS Take 1 capsule by mouth daily.   Yes Historical Provider, MD  OVER THE COUNTER MEDICATION Place 1 drop into both eyes every morning. OTC Artificial Tears eyedrops   Yes Historical Provider, MD  simvastatin (ZOCOR) 40 MG tablet TAKE 1 TABLET BY MOUTH AT BEDTIME FOR CHOLESTEROL 02/12/17  Yes Tiffany L Reed, DO  vitamin E 400 UNIT capsule Take 400 Units by mouth daily.   Yes Historical Provider, MD  meclizine (ANTIVERT) 25 MG tablet Take 1 tablet (25 mg total) by mouth 3 (three) times daily as needed for dizziness. 02/23/17   Fatima Blank, MD    Family History Family History  Problem Relation Age of Onset  . Heart attack Father 13  . Colon cancer Neg Hx     Social History Social History  Substance Use Topics  . Smoking status: Former Smoker    Packs/day: 0.75    Years: 25.00    Types: Cigarettes    Quit date: 10/23/1984  . Smokeless tobacco: Never Used  . Alcohol use 0.6 oz/week    1 Glasses of wine per week     Comment: social     Allergies   Celebrex [celecoxib] and Codeine   Review of Systems Review of Systems  Gastrointestinal: Positive for diarrhea (loose bm), nausea and vomiting.  Neurological: Positive for dizziness.   All other systems are reviewed and are negative for acute change except as noted in the HPI   Physical Exam Updated Vital Signs BP (!) 147/103 (BP Location: Left Arm)   Pulse 74   Temp 97.4 F (36.3 C) (Oral)   Resp 20   Physical Exam  Constitutional: He is oriented to person, place, and time. He appears well-developed and well-nourished. No distress.  HENT:  Head: Normocephalic and atraumatic.  Nose: Nose normal.  Eyes: Conjunctivae and EOM are normal. Pupils are equal, round, and reactive to light. Right eye exhibits no discharge. Left eye exhibits no discharge. No scleral icterus.  Neck: Normal range of motion. Neck  supple.  Cardiovascular: Normal rate and regular rhythm.  Exam reveals no gallop and no friction rub.   No murmur heard. Pulmonary/Chest: Effort normal and breath sounds normal. No stridor. No respiratory distress. He has no rales.  Abdominal: Soft. He exhibits no distension. There is no tenderness.  Musculoskeletal: He exhibits no edema or tenderness.  Neurological: He is alert and oriented to person, place, and time.  Mental Status: Alert and oriented to person, place, and time. Attention and concentration normal. Speech clear. Recent memory is intac  Cranial Nerves  II Visual Fields: Intact to confrontation. Visual fields intact. III, IV, VI: Pupils equal and reactive to light and near. Full eye movement  with rotary nystagmus  V Facial Sensation: Normal. No weakness of masticatory muscles  VII: No facial weakness or asymmetry  VIII Auditory Acuity: Grossly normal  IX/X: The uvula is midline; the palate elevates symmetrically  XI: Normal sternocleidomastoid and trapezius strength  XII: The tongue is midline. No atrophy or fasciculations.   Motor System: Muscle Strength: 5/5 and symmetric in the upper and lower extremities. No pronation or drift.  Muscle Tone: Tone and muscle bulk are normal in the upper and lower extremities.   Reflexes: DTRs: 2+ and symmetrical in all four extremities. Plantar responses are flexor bilaterally.  Coordination: Intact finger-to-nose, heel-to-shin. essential tremor.  Sensation: Intact to light touch, and pinprick. +Romberg test.  Gait: ataxic   Skin: Skin is warm and dry. No rash noted. He is not diaphoretic. No erythema.  Psychiatric: He has a normal mood and affect.  Vitals reviewed.    ED Treatments / Results  Labs (all labs ordered are listed, but only abnormal results are displayed) Labs Reviewed  CBC WITH DIFFERENTIAL/PLATELET - Abnormal; Notable for the following:       Result Value   RBC 4.16 (*)    HCT 38.2 (*)    All other components  within normal limits  COMPREHENSIVE METABOLIC PANEL - Abnormal; Notable for the following:    Glucose, Bld 127 (*)    BUN 21 (*)    Creatinine, Ser 1.71 (*)    GFR calc non Af Amer 34 (*)    GFR calc Af Amer 39 (*)    All other components within normal limits  RAPID URINE DRUG SCREEN, HOSP PERFORMED - Abnormal; Notable for the following:    Benzodiazepines POSITIVE (*)    All other components within normal limits  URINALYSIS, ROUTINE W REFLEX MICROSCOPIC - Abnormal; Notable for the following:    APPearance HAZY (*)    Protein, ur 30 (*)    Squamous Epithelial / LPF 0-5 (*)    All other components within normal limits  I-STAT CHEM 8, ED - Abnormal; Notable for the following:    BUN 28 (*)    Creatinine, Ser 1.60 (*)    Glucose, Bld 129 (*)    Hemoglobin 11.2 (*)    HCT 33.0 (*)    All other components within normal limits  LIPASE, BLOOD  ETHANOL  PROTIME-INR  APTT  I-STAT TROPOININ, ED    EKG  EKG Interpretation  Date/Time:  Friday Feb 23 2017 10:55:57 EDT Ventricular Rate:  64 PR Interval:    QRS Duration: 165 QT Interval:  461 QTC Calculation: 476 R Axis:   -12 Text Interpretation:  Sinus rhythm Right bundle branch block Otherwise no significant change Confirmed by [facility] MD, PEDRO ([id number]) on 02/23/2017 11:46:52 AM       Radiology Mr Brain Wo Contrast  Result Date: 02/23/2017 CLINICAL DATA:  Imbalance.  Code stroke.  Nystagmus and ataxia. EXAM: MRI HEAD WITHOUT CONTRAST TECHNIQUE: Multiplanar, multiecho pulse sequences of the brain and surrounding structures were obtained without intravenous contrast. COMPARISON:  CT head earlier today. FINDINGS: Brain: No acute infarction, hemorrhage, hydrocephalus, extra-axial collection or mass lesion. Generalized atrophy. Mild subcortical and periventricular T2 and FLAIR hyperintensities, likely chronic microvascular ischemic change. Vascular: Flow voids are maintained throughout the carotid, basilar, and vertebral arteries. There  are no areas of chronic hemorrhage. Skull and upper cervical spine: Unremarkable visualized calvarium, skullbase, and cervical vertebrae. Pituitary, pineal, cerebellar tonsils unremarkable. No upper cervical cord lesions. Sinuses/Orbits: Chronic sinus disease. BILATERAL cataract extraction. Other:  None. IMPRESSION: Atrophy and small vessel disease. No acute intracranial findings. No evidence for brainstem stroke or proximal vascular occlusion. Electronically Signed   By: Staci Righter M.D.   On: 02/23/2017 14:27   Ct Head Code Stroke W/o Cm  Result Date: 02/23/2017 CLINICAL DATA:  Code stroke.  Vertigo. EXAM: CT HEAD WITHOUT CONTRAST TECHNIQUE: Contiguous axial images were obtained from the base of the skull through the vertex without intravenous contrast. COMPARISON:  CT head 05/17/2015. FINDINGS: Brain: No evidence of acute infarction, hemorrhage, hydrocephalus, extra-axial collection or mass lesion/mass effect. Advanced atrophy. Chronic microvascular ischemic change. Vascular: Vascular calcification. No CT signs of large vessel occlusion. Skull: Normal. Negative for fracture or focal lesion. Sinuses/Orbits: No acute finding. Other: None. ASPECTS [facility] Stroke Program Early CT Score) - Ganglionic level infarction (caudate, lentiform nuclei, internal capsule, insula, M1-M3 cortex): 7 - Supraganglionic infarction (M4-M6 cortex): 3 Total score (0-10 with 10 being normal): 10 IMPRESSION: 1. Atrophy and small vessel disease. No acute intracranial findings. 2. ASPECTS is 10. A call was placed at the time of interpretation on 02/23/2017 at 11:38 am to Dr. Shon Hale. Electronically Signed   By: Staci Righter M.D.   On: 02/23/2017 11:40    Procedures Procedures (including critical care time)  Medications Ordered in ED Medications  ondansetron (ZOFRAN) injection 4 mg (4 mg Intravenous Given 02/23/17 1115)  sodium chloride 0.9 % bolus 1,000 mL (1,000 mLs Intravenous New Bag/Given 02/23/17 1110)  meclizine (ANTIVERT)  tablet 25 mg (25 mg Oral Given 02/23/17 1207)     Initial Impression / Assessment and Plan / ED Course  I have reviewed the triage vital signs and the nursing notes.  Pertinent labs & imaging results that were available during my care of the patient were reviewed by me and considered in my medical decision making (see chart for details).     Presentation was concerning for likely stroke. Code stroke was initiated as he was within the 8 hour window. CT head was unremarkable. Labs closely reassuring. Neurology at bedside to evaluate the patient and felt that this was likely peripheral in nature as his exam had changed since my assessment. However they recommended obtaining an MRI to assess for possible stroke.  In the interim patient was provided with IV fluids and Antivert.  MRI without evidence of acute infarct. Significant improvement in patient's symptomatology with the Antivert and IV fluids. He was able to ambulate without complication.  We'll continue to treat the patient with a meclizine as needed and have him follow-up with his primary care provider.   The patient is safe for discharge with strict return precautions.   Final Clinical Impressions(s) / ED Diagnoses   Final diagnoses:  Vertigo   Disposition: Discharge  Condition: Good  I have discussed the results, Dx and Tx plan with the patient who expressed understanding and agree(s) with the plan. Discharge instructions discussed at great length. The patient was given strict return precautions who verbalized understanding of the instructions. No further questions at time of discharge.    New Prescriptions   MECLIZINE (ANTIVERT) 25 MG TABLET    Take 1 tablet (25 mg total) by mouth 3 (three) times daily as needed for dizziness.    Follow Up: Gayland Curry, DO [place]. [place] [address] [phone]  Schedule an appointment as soon as possible for a visit  in 3-5 days, If symptoms do not improve or   worsen      Fatima Blank, MD 02/23/17  1607  

## 2017-02-23 NOTE — ED Notes (Signed)
Pt transported to CT with this RN 

## 2017-02-23 NOTE — Consult Note (Signed)
Neurology Consult Note  Reason for Consultation: CODE STROKE  Requesting provider: Addison Lank, MD  CC: Dizziness  HPI: This is an 22-yo RH man who presented to the ED for evaluation of vertigo. History is obtained directly from the patient who is a good historian.  He reports that he was driving to his office this morning at about 0930 today when he noticed that his ears felt "uneven." He then felt dizzy, describing a sense of "imbalance" but no sense of motion. He continued to his office. He states that his symptoms continued to get worse and his dizziness increased, particularly when he tried to stand up and walk. He continues to deny a sense of actual motion, just says he felt off balance. This was accompanied by nausea. On arrival in the ED he had an episode of vomiting and diarrhea. He was seen by the ED MD who noted rotatory nystagmus and ataxia in both arms so CODE STROKE was activated. I arrived to assess the patient with the rest of the stroke team. He continued to c/o dizziness and would keep his eyes closed because this helped reduce his symptoms. He noted increased dizziness when he moved his eyes or head to the left with nystagmus that was most pronounced with left gaze. NIHSS was 0. Due to suspicion for peripheral etiology of his symptoms CODE STROKE was canceled.   Last known well: 0930 today  NHISS score: 0 mRS score: 0 tPA given?: No, symptoms exam findings suggest peripheral etiology rather than central  PMH:  Past Medical History:  Diagnosis Date  . Abdominal aneurysm without mention of rupture   . Anginal pain ([place])    recent hospitalization  . Anxiety   . Arthritis   . Cancer [facility]) Jan. 2017   skin cancer removal/ Right medial lower Leg  . Colon polyps   . COPD (chronic obstructive pulmonary disease) ([place])   . Diverticulosis of colon (without mention of hemorrhage)   . H/O hiatal hernia   . Hyperlipidemia   . Inguinal hernia   . Insomnia, unspecified      PSH:  Past Surgical History:  Procedure Laterality Date  . ABDOMINAL AORTIC ENDOVASCULAR STENT GRAFT N/A 10/29/2013   Procedure: ABDOMINAL AORTIC ENDOVASCULAR STENT GRAFT;  Surgeon: [name], MD;  Location: [place];  Service: Vascular;  Laterality: N/A;  . bilateral shoulder repair    . CHOLECYSTECTOMY  1962  . COLON SURGERY  2007  . HERNIA REPAIR    . IR GENERIC HISTORICAL  12/24/2014   IR RADIOLOGIST EVAL & MGMT 12/24/2014 Jacqulynn Cadet, MD [facility] INTERV RAD  . MOHS SURGERY Right 11/2015   lower leg  . NASAL SEPTUM SURGERY  2011  . VASECTOMY  1962    Family history: Family History  Problem Relation Age of Onset  . Heart attack Father 35  . Colon cancer Neg Hx     Social history:  Social History   Social History  . Marital status: Married    Spouse name: N/A  . Number of children: N/A  . Years of education: N/A   Occupational History  . retired    Social History Main Topics  . Smoking status: Former Smoker    Packs/day: 0.75    Years: 25.00    Types: Cigarettes    Quit date: 10/23/1984  . Smokeless tobacco: Never Used  . Alcohol use 0.6 oz/week    1 Glasses of wine per week     Comment: social  .  Drug use: No  . Sexual activity: No   Other Topics Concern  . Not on file   Social History Narrative  . No narrative on file    Current inpatient meds: Medications reviewed and reconciled Current Facility-Administered Medications  Medication Dose Route Frequency Provider Last Rate Last Dose  . meclizine (ANTIVERT) tablet 25 mg  25 mg Oral Once Fatima Blank, MD       Current Outpatient Prescriptions  Medication Sig Dispense Refill  . aspirin 81 MG tablet Take 81 mg by mouth daily.    . B Complex-C (SUPER B COMPLEX PO) Take 1 tablet by mouth daily.     . chlorpheniramine (CHLOR-TRIMETON) 4 MG tablet Take 4-8 mg by mouth every 6 (six) hours as needed for allergies.     . Cholecalciferol (VITAMIN D) 2000 UNITS CAPS Take 2,000 Units by mouth daily.      . fexofenadine (ALLEGRA) 180 MG tablet Take 1 tablet (180 mg total) by mouth daily. 30 tablet 0  . Glucosamine-Chondroit-Vit C-Mn (GLUCOSAMINE CHONDR 1500 COMPLX PO) Take 1 capsule by mouth daily.    Marland Kitchen ketoconazole (NIZORAL) 2 % cream Apply 1 application topically daily. 30 g 3  . LORazepam (ATIVAN) 1 MG tablet TAKE 1.5 TABLETS BY MOUTH EVERY NIGHT AT BEDTIME 45 tablet 0  . Melatonin 3 MG TABS Take 2 tablets by mouth at bedtime.     . Multiple Vitamin (MULTIVITAMIN) tablet Take 1 tablet by mouth daily.    . niacin 100 MG tablet Take 250 mg by mouth daily with breakfast.     . Omega-3 Fatty Acids (FISH OIL) 1000 MG CAPS Take 1 capsule by mouth daily.    Marland Kitchen OVER THE COUNTER MEDICATION Place 1 drop into both eyes every morning. Eyedrops    . simvastatin (ZOCOR) 40 MG tablet TAKE 1 TABLET BY MOUTH AT BEDTIME FOR CHOLESTEROL 90 tablet 1  . vitamin E 400 UNIT capsule Take 400 Units by mouth daily.      Allergies: Allergies  Allergen Reactions  . Celebrex [Celecoxib] Other (See Comments)    Raised B/P   . Codeine Other (See Comments)    Severe stomach pain     ROS: As per HPI. A full 14-point review of systems was performed and is otherwise unremarkable.  PE:  BP 128/70   Pulse 61   Temp 97.4 F (36.3 C) (Oral)   Resp 17   SpO2 96%   General: WDWN, mild distress 2/2 vertigo. AAO x4. Speech clear, no dysarthria. No aphasia. Follows commands briskly. Affect is bright with congruent mood. Comportment is normal.  HEENT: Normocephalic. Neck supple without LAD. MMM, OP clear. Dentition good. Sclerae anicteric. No conjunctival injection. Head impulse test positive to the L.  CV: Regular, no murmur. Carotid pulses full and symmetric, no bruits. Distal pulses 2+ and symmetric.  Lungs: CTAB.  Abdomen: Soft, non-distended, non-tender. Bowel sounds present x4.  Extremities: No C/C/E. Neuro:  CN: Pupils are equal and round. They are symmetrically reactive from 3-->2 mm. EOMI. He has horizontal  nystagmus that is most pronounced on left gaze with a rotatory component. No reported diplopia. He has a positive head impulse test to the L. Facial sensation is intact to light touch. Face is symmetric at rest with normal strength and mobility. He seems to have some possible hearing loss, requires slow loud voice to be heard clearly. Palate elevates symmetrically and uvula is midline. Voice is normal in tone, pitch and quality. Bilateral SCM and trapezii  are 5/5. Tongue is midline with normal bulk and mobility.  Motor: Normal bulk, tone, and strength. No tremor or other abnormal movements. No drift.  Sensation: Intact to light touch. No extinction.  DTRs: 2+, symmetric. Toes downgoing bilaterally. No pathologic reflexes.  Coordination: Finger-to-nose and heel-to-shin are without dysmetria. Finger taps are normal in amplitude and speed, no decrement.    Labs:  Lab Results  Component Value Date   WBC 6.4 02/23/2017   HGB 11.2 (L) 02/23/2017   HCT 33.0 (L) 02/23/2017   PLT 172 02/23/2017   GLUCOSE 129 (H) 02/23/2017   CHOL 154 11/14/2016   TRIG 175 (H) 11/14/2016   HDL 40 (L) 11/14/2016   LDLCALC 79 11/14/2016   ALT 20 02/23/2017   AST 28 02/23/2017   NA 144 02/23/2017   K 4.6 02/23/2017   CL 107 02/23/2017   CREATININE 1.60 (H) 02/23/2017   BUN 28 (H) 02/23/2017   CO2 22 02/23/2017   TSH 1.680 04/11/2013   INR 1.13 01/08/2015   PTT 28 Troponin 0.00 Ethanol <5 UA pending Urine drug screen pending Lipase 26  Imaging:  I have personally and independently reviewed the [place] without contrast from today. This shows no obvious acute abnormality. There is moderate diffuse generalized atrophy with ex-vacuo dilatation of the ventricles. There is moderate chronic small vessel change in the bihemispheric white matter. Mild atherosclerotic calcification of the carotid and vertebral arteries is noted bilaterally. I have discussed this scan with the interpreting radiologist at the time of the  consult.   Assessment and Plan:  1. Acute vertigo: Current exam is most suggestive of peripheral vertigo seeming to originate in the left vestibular system given positive head impulse test to L and increased nystagmus with left gaze. Cannot fully exclude a central lesion, however, though he has no other deficits to suggest a brainstem lesion on my exam. Will obtain MRI brain without contrast to exclude stroke. If positive will need to be admitted for stroke evaluation. If negative, he can be symptomatically managed and possibly discharged home.   This was discussed with the patient. Education was provided on the diagnosis and expected evaluation and treatment. He is in agreement with the plan as noted. He was given the opportunity to ask any questions and these were addressed to his satisfaction.   I discussed my findings and impression with ED attending, Dr. Leonette Monarch, at the time of my consultation.

## 2017-02-23 NOTE — ED Triage Notes (Signed)
Pt reports sudden onset of dizziness while driving this morning, 1 episode of n/v/d upon arrival to ed. Pt a/ox4, diaphoretic.

## 2017-02-23 NOTE — ED Notes (Signed)
Patient stopped nurse and said he does not want to answer any questions until he makes a BM.

## 2017-02-23 NOTE — Progress Notes (Signed)
Code stroke called at 1121, Patient arrived to [facility] ED at 1013.  As per Patient LSN 0930.  PAtient states he was sitting at his desk when he developed an imbalance in his left ear and then it went to both ears, He tried to get up and walk and he felt dizzy.  NIHSS 0.  Cancelled by Dr Shon Hale at [phone]

## 2017-02-23 NOTE — ED Notes (Signed)
Pt transported to MRI 

## 2017-02-23 NOTE — ED Notes (Signed)
Stroke team arrived to CT

## 2017-02-23 NOTE — ED Notes (Signed)
Pharmacist arrived to CT

## 2017-02-23 NOTE — ED Notes (Signed)
Pt denies any previous issues with claustrophobia, does not think he will need any medications prior to MRI

## 2017-02-27 ENCOUNTER — Other Ambulatory Visit: Payer: Self-pay | Admitting: Nurse Practitioner

## 2017-02-27 ENCOUNTER — Ambulatory Visit (INDEPENDENT_AMBULATORY_CARE_PROVIDER_SITE_OTHER): Payer: Medicare Other | Admitting: Nurse Practitioner

## 2017-02-27 ENCOUNTER — Encounter: Payer: Self-pay | Admitting: Nurse Practitioner

## 2017-02-27 ENCOUNTER — Ambulatory Visit
Admission: RE | Admit: 2017-02-27 | Discharge: 2017-02-27 | Disposition: A | Discharge status: Home / Self Care | Payer: Medicare Other | Source: Ambulatory Visit | Attending: Interventional Radiology | Admitting: Interventional Radiology

## 2017-02-27 VITALS — BP 136/82 | HR 77 | Temp 97.6°F | Resp 18 | Ht 68.0 in | Wt 181.0 lb

## 2017-02-27 DIAGNOSIS — T82330D Leakage of aortic (bifurcation) graft (replacement), subsequent encounter: Secondary | ICD-10-CM

## 2017-02-27 DIAGNOSIS — I6523 Occlusion and stenosis of bilateral carotid arteries: Secondary | ICD-10-CM

## 2017-02-27 DIAGNOSIS — Z7189 Other specified counseling: Secondary | ICD-10-CM | POA: Diagnosis not present

## 2017-02-27 DIAGNOSIS — G4700 Insomnia, unspecified: Secondary | ICD-10-CM

## 2017-02-27 DIAGNOSIS — IMO0001 Reserved for inherently not codable concepts without codable children: Secondary | ICD-10-CM

## 2017-02-27 DIAGNOSIS — H6123 Impacted cerumen, bilateral: Secondary | ICD-10-CM | POA: Diagnosis not present

## 2017-02-27 HISTORY — PX: IR RADIOLOGIST EVAL & MGMT: IMG5224

## 2017-02-27 MED ORDER — TRAZODONE HCL 50 MG PO TABS: ORAL_TABLET | ORAL | 1 refills | Status: DC

## 2017-02-27 NOTE — Progress Notes (Signed)
Careteam: Patient Care Team: Gayland Curry, DO as PCP - General (Geriatric Medicine) Milus Banister, MD as Attending Physician (Gastroenterology)  Advanced Directive information Does Patient Have a Medical Advance Directive?: Yes, Type of Advance Directive: [place];Living will  Allergies  Allergen Reactions  . Celebrex [Celecoxib] Other (See Comments)    Raised B/P   . Codeine Other (See Comments)    Severe stomach pain     Chief Complaint  Patient presents with  . Acute Visit    Pt states his left ear feels like it is clogged. Wants to discuss not being able to stay asleep at night.      HPI: Patient is a 83 y.o. male seen in the office today due to ear being clogged per ED physician. Pt went to ED due to vertigo and it was mentioned to him there. He had full work up from [facility] which was negative and Vertigo has since resolved.   Also would like DNR signed.   Reports he is having a hard time staying asleep. Falls asleep without issue but then will wake up and not be able to go back to sleep. Taking ativan 1.5 mg qhs and melatonin 6 mg nightly.   Review of Systems:  Review of Systems  Constitutional: Negative for activity change, appetite change and fever.  HENT: Positive for hearing loss.   Respiratory: Negative for cough and shortness of breath.   Cardiovascular: Negative for chest pain and leg swelling.  Genitourinary: Negative for difficulty urinating and dysuria.  Neurological: Negative for dizziness and weakness.  Psychiatric/Behavioral: Positive for confusion and sleep disturbance. Negative for behavioral problems. The patient is nervous/anxious.     Past Medical History:  Diagnosis Date  . Abdominal aneurysm without mention of rupture   . Anginal pain ([place])    recent hospitalization  . Anxiety   . Arthritis   . Cancer [facility]) Jan. 2017   skin cancer removal/ Right medial lower Leg  . Colon polyps   . COPD (chronic obstructive  pulmonary disease) ([place])   . Diverticulosis of colon (without mention of hemorrhage)   . H/O hiatal hernia   . Hyperlipidemia   . Inguinal hernia   . Insomnia, unspecified    Past Surgical History:  Procedure Laterality Date  . ABDOMINAL AORTIC ENDOVASCULAR STENT GRAFT N/A 10/29/2013   Procedure: ABDOMINAL AORTIC ENDOVASCULAR STENT GRAFT;  Surgeon: [name], MD;  Location: [place];  Service: Vascular;  Laterality: N/A;  . bilateral shoulder repair    . CHOLECYSTECTOMY  1962  . COLON SURGERY  2007  . HERNIA REPAIR    . IR GENERIC HISTORICAL  12/24/2014   IR RADIOLOGIST EVAL & MGMT 12/24/2014 Jacqulynn Cadet, MD [facility] INTERV RAD  . MOHS SURGERY Right 11/2015   lower leg  . NASAL SEPTUM SURGERY  2011  . VASECTOMY  1962   Social History:   reports that he quit smoking about 32 years ago. His smoking use included Cigarettes. He has a 18.75 pack-year smoking history. He has never used smokeless tobacco. He reports that he drinks about 0.6 oz of alcohol per week . He reports that he does not use drugs.  Family History  Problem Relation Age of Onset  . Heart attack Father 16  . Colon cancer Neg Hx     Medications: Patient's Medications  New Prescriptions   No medications on file  Previous Medications   ASPIRIN 81 MG TABLET    Take 81  mg by mouth daily.   B COMPLEX-C (SUPER B COMPLEX PO)    Take 1 tablet by mouth daily.    CHLORPHENIRAMINE (CHLOR-TRIMETON) 4 MG TABLET    Take 4-8 mg by mouth every 6 (six) hours as needed for allergies.    CHOLECALCIFEROL (VITAMIN D) 2000 UNITS CAPS    Take 2,000 Units by mouth daily.    FEXOFENADINE (ALLEGRA) 180 MG TABLET    Take 1 tablet (180 mg total) by mouth daily.   GLUCOSAMINE-CHONDROIT-VIT C-MN (GLUCOSAMINE CHONDR 1500 COMPLX PO)    Take 1 capsule by mouth daily.   KETOCONAZOLE (NIZORAL) 2 % CREAM    Apply 1 application topically daily.   LORAZEPAM (ATIVAN) 1 MG TABLET    TAKE 1.5 TABLETS BY MOUTH EVERY NIGHT AT BEDTIME   MECLIZINE  (ANTIVERT) 25 MG TABLET    Take 1 tablet (25 mg total) by mouth 3 (three) times daily as needed for dizziness.   MELATONIN 3 MG TABS    Take 2 tablets by mouth at bedtime.    MULTIPLE VITAMIN (MULTIVITAMIN) TABLET    Take 1 tablet by mouth daily.   NIACIN 100 MG TABLET    Take 250 mg by mouth daily with breakfast.    OMEGA-3 FATTY ACIDS (FISH OIL) 1000 MG CAPS    Take 1 capsule by mouth daily.   OVER THE COUNTER MEDICATION    Place 1 drop into both eyes every morning. OTC Artificial Tears eyedrops   SIMVASTATIN (ZOCOR) 40 MG TABLET    TAKE 1 TABLET BY MOUTH AT BEDTIME FOR CHOLESTEROL   VITAMIN E 400 UNIT CAPSULE    Take 400 Units by mouth daily.  Modified Medications   No medications on file  Discontinued Medications   No medications on file     Physical Exam:  Vitals:   02/27/17 1507  BP: 136/82  Pulse: 77  Resp: 18  Temp: 97.6 F (36.4 C)  TempSrc: Oral  SpO2: 98%  Weight: 181 lb (82.1 kg)  Height: 5\' 8"  (1.727 m)   Body mass index is 27.52 kg/m.  Physical Exam  Constitutional: He is oriented to person, place, and time. He appears well-developed and well-nourished. No distress.  HENT:  Cerumen impaction bilaterally  Cardiovascular: Normal rate and regular rhythm.   Murmur heard. Pulmonary/Chest: Effort normal and breath sounds normal. No respiratory distress. He has no wheezes.  Abdominal: Soft. Bowel sounds are normal. He exhibits no distension.  Musculoskeletal: Normal range of motion. He exhibits no tenderness.     Neurological: He is alert and oriented to person, place, and time. No cranial nerve deficit.  Skin: Skin is warm and dry.  Psychiatric: He has a normal mood and affect.    Labs reviewed: Basic Metabolic Panel:  Recent Labs  05/03/16 0832 11/14/16 0941 02/23/17 1058 02/23/17 1143  NA 141 141 140 144  K 4.2 4.8 3.9 4.6  CL 107 108 110 107  CO2 25 25 22   --   GLUCOSE 91 108* 127* 129*  BUN 19 21 21* 28*  CREATININE 1.56* 1.64* 1.71* 1.60*    CALCIUM 8.6 9.3 9.3  --    Liver Function Tests:  Recent Labs  05/03/16 0832 11/14/16 0941 02/23/17 1058  AST 24 23 28   ALT 22 19 20   ALKPHOS 64 55 58  BILITOT 0.4 0.5 0.9  PROT 6.8 7.0 7.0  ALBUMIN 3.9 3.8 3.8    Recent Labs  02/23/17 1058  LIPASE 26   No results for  input(s): AMMONIA in the last 8760 hours. CBC:  Recent Labs  05/03/16 0832 11/14/16 0941 02/23/17 1058 02/23/17 1143  WBC 5.9 6.5 6.4  --   NEUTROABS 3,068 3,510 3.7  --   HGB 12.8* 13.8 13.1 11.2*  HCT 37.9* 40.8 38.2* 33.0*  MCV 93.6 93.4 91.8  --   PLT 156 180 172  --    Lipid Panel:  Recent Labs  05/03/16 0832 11/14/16 0941  CHOL 140 154  HDL 41 40*  LDLCALC 76 79  TRIG 117 175*  CHOLHDL 3.4 3.9   TSH: No results for input(s): TSH in the last 8760 hours. A1C: No results found for: HGBA1C   Assessment/Plan 1. Bilateral impacted cerumen Ear lavage unsuccessful. To use debrox 5-10 drops to bilateral ear BID for 4 days then to follow up in office for wash  2. Insomnia, unspecified type Will start trazodone 25 mg daily may increase to 50 mg daily after 1 week -to decrease lorazepam to 1 mg daily after 1 week - traZODone (DESYREL) 50 MG tablet; Start with 1/2 tablet nightly for sleep, may increase to 1 tablet nightly  Dispense: 30 tablet; Refill: 1  3. Advanced care planning/counseling discussion -DNR signed and discussed    Carlos American. Harle Battiest  [facility] [phone] 8 am - 5 pm) [phone] (after hours)

## 2017-02-27 NOTE — Progress Notes (Signed)
Referring Physician(s): McCullough,Heath  Chief Complaint: The patient is seen in follow up today s/p successful endovascular repair of a type 2 endoleak preformed 01/08/2015  History of present illness:  83 year old male with type 2 endoleak and expanding abdominal aortic aneurysm  Successful combined coil and Onyx liquid embolic embolization of the aneurysm sac.  Onyx embolization of the afferent right L3 lumbar artery.  Coil embolization of the distal segment of the right ascending iliolumbar artery effectively closing the origin of several small branch vessels providing collateral flow to the right L2 lumbar artery. Coil embolization of the inferior mesenteric artery at its origin from the abdominal aorta.  Pt has done well. Denies abd pain; N/V/D No other complaints He recently saw Dr. Trula Slade after getting an [place] and was told 'everything was okay'   Past Medical History:  Diagnosis Date  . Abdominal aneurysm without mention of rupture   . Anginal pain ([place])    recent hospitalization  . Anxiety   . Arthritis   . Cancer [facility]) Jan. 2017   skin cancer removal/ Right medial lower Leg  . Colon polyps   . COPD (chronic obstructive pulmonary disease) ([place])   . Diverticulosis of colon (without mention of hemorrhage)   . H/O hiatal hernia   . Hyperlipidemia   . Inguinal hernia   . Insomnia, unspecified     Past Surgical History:  Procedure Laterality Date  . ABDOMINAL AORTIC ENDOVASCULAR STENT GRAFT N/A 10/29/2013   Procedure: ABDOMINAL AORTIC ENDOVASCULAR STENT GRAFT;  Surgeon: [name], MD;  Location: [place];  Service: Vascular;  Laterality: N/A;  . bilateral shoulder repair    . CHOLECYSTECTOMY  1962  . COLON SURGERY  2007  . HERNIA REPAIR    . IR GENERIC HISTORICAL  12/24/2014   IR RADIOLOGIST EVAL & MGMT 12/24/2014 Jacqulynn Cadet, MD [facility] INTERV RAD  . MOHS SURGERY Right 11/2015   lower leg  . NASAL SEPTUM SURGERY  2011  . VASECTOMY  1962     Allergies: Celebrex [celecoxib] and Codeine  Medications: Prior to Admission medications   Medication Sig Start Date End Date Taking? Authorizing Provider  aspirin 81 MG tablet Take 81 mg by mouth daily.   Yes Historical Provider, MD  B Complex-C (SUPER B COMPLEX PO) Take 1 tablet by mouth daily.    Yes Historical Provider, MD  chlorpheniramine (CHLOR-TRIMETON) 4 MG tablet Take 4-8 mg by mouth every 6 (six) hours as needed for allergies.    Yes Historical Provider, MD  Cholecalciferol (VITAMIN D) 2000 UNITS CAPS Take 2,000 Units by mouth daily.    Yes Historical Provider, MD  citalopram (CELEXA) 40 MG tablet Take 1 tablet (40 mg total) by mouth daily. 12/03/14  Yes Tiffany L Reed, DO  fexofenadine (ALLEGRA) 180 MG tablet Take 1 tablet (180 mg total) by mouth daily. 12/03/14  Yes Tiffany L Reed, DO  Glucosamine-Chondroit-Vit C-Mn (GLUCOSAMINE CHONDR 1500 COMPLX PO) Take 1 capsule by mouth daily.   Yes Historical Provider, MD  LORazepam (ATIVAN) 1 MG tablet TAKE 1/2 TABLET BY MOUTH EVERY MORNING AND 1 EVERY NIGHT AT BEDTIME. 05/17/15  Yes Tiffany L Reed, DO  Melatonin 3 MG TABS Take 2 tablets by mouth at bedtime.    Yes Historical Provider, MD  Multiple Vitamin (MULTIVITAMIN) tablet Take 1 tablet by mouth daily.   Yes Historical Provider, MD  niacin 100 MG tablet Take 250 mg by mouth daily with breakfast.    Yes Historical Provider, MD  Omega-3 Fatty  Acids (FISH OIL) 1000 MG CAPS Take 1 capsule by mouth daily.   Yes Historical Provider, MD  OVER THE COUNTER MEDICATION Place 1 drop into both eyes every morning.   Yes Historical Provider, MD  simvastatin (ZOCOR) 40 MG tablet TAKE 1 TABLET BY MOUTH AT BEDTIME FOR CHOLESTEROL 03/08/15  Yes Tiffany L Reed, DO  vitamin E 400 UNIT capsule Take 400 Units by mouth daily.   Yes Historical Provider, MD     Family History  Problem Relation Age of Onset  . Heart attack Father 89  . Colon cancer Neg Hx     Social History   Social History  . Marital  status: Married    Spouse name: N/A  . Number of children: N/A  . Years of education: N/A   Occupational History  . retired    Social History Main Topics  . Smoking status: Former Smoker    Packs/day: 0.75    Years: 25.00    Types: Cigarettes    Quit date: 10/23/1984  . Smokeless tobacco: Never Used  . Alcohol use 0.6 oz/week    1 Glasses of wine per week     Comment: social  . Drug use: No  . Sexual activity: No   Other Topics Concern  . Not on file   Social History Narrative  . No narrative on file     Vital Signs: BP (!) 149/82   Pulse 82   Temp 98.6 F (37 C) (Oral)   Resp 16   Ht 5\' 8"  (1.727 m)   Wt 185 lb (83.9 kg)   SpO2 94%   BMI 28.13 kg/m   Physical Exam  Constitutional: He is oriented to person, place, and time. He appears well-nourished.  Cardiovascular: Normal rate, regular rhythm and normal heart sounds.   Pulmonary/Chest: Effort normal and breath sounds normal. He has no wheezes.  Abdominal: Soft. Bowel sounds are normal. There is no tenderness.  Musculoskeletal: Normal range of motion.  Neurological: He is alert and oriented to person, place, and time.  Skin: Skin is warm and dry.  Psychiatric: He has a normal mood and affect. His behavior is normal. Judgment and thought content normal.  Nursing note and vitals reviewed.   Labs:  CBC:  Recent Labs  05/03/16 0832 11/14/16 0941 02/23/17 1058 02/23/17 1143  WBC 5.9 6.5 6.4  --   HGB 12.8* 13.8 13.1 11.2*  HCT 37.9* 40.8 38.2* 33.0*  PLT 156 180 172  --     COAGS:  Recent Labs  02/23/17 1140  INR 1.16  APTT 28    BMP:  Recent Labs  05/03/16 0832 11/14/16 0941 02/23/17 1058 02/23/17 1143  NA 141 141 140 144  K 4.2 4.8 3.9 4.6  CL 107 108 110 107  CO2 25 25 22   --   GLUCOSE 91 108* 127* 129*  BUN 19 21 21* 28*  CALCIUM 8.6 9.3 9.3  --   CREATININE 1.56* 1.64* 1.71* 1.60*  GFRNONAA  --  37* 34*  --   GFRAA  --  43* 39*  --     LIVER FUNCTION TESTS:  Recent  Labs  05/03/16 0832 11/14/16 0941 02/23/17 1058  BILITOT 0.4 0.5 0.9  AST 24 23 28   ALT 22 19 20   ALKPHOS 64 55 58  PROT 6.8 7.0 7.0  ALBUMIN 3.9 3.8 3.8    Assessment: Endovascular repair Type 2 endoleak AAA 12/2014 Follow up US EVAR done at [facility] on 4/25 shows stent  graft patent and aneurysm sac slightly decreased in size with no extrastent flow. Dr Laurence Ferrari has seen pt and has answered all questions to satisfaction Plan for repeat F/U in 1 year. Pt leaves with good understanding of plan  Signed: Ascencion Dike 02/27/2017, 2:42 PM

## 2017-02-27 NOTE — Patient Instructions (Addendum)
To start trazodone 1/2 tablet at night for sleep, after 1 week may increase to 1 tablet at night for sleep  After 1 week decrease lorazepam to 1 tablet   To use Debrox which is over the counter to use 5-10 drops in ear twice daily for a max for  Days then make an appt come to office for ear wash

## 2017-03-08 ENCOUNTER — Ambulatory Visit (INDEPENDENT_AMBULATORY_CARE_PROVIDER_SITE_OTHER): Payer: Medicare Other | Admitting: Nurse Practitioner

## 2017-03-08 ENCOUNTER — Encounter: Payer: Self-pay | Admitting: Nurse Practitioner

## 2017-03-08 VITALS — BP 140/88 | HR 72 | Temp 98.0°F | Ht 68.0 in | Wt 187.8 lb

## 2017-03-08 DIAGNOSIS — H6123 Impacted cerumen, bilateral: Secondary | ICD-10-CM

## 2017-03-08 DIAGNOSIS — G4700 Insomnia, unspecified: Secondary | ICD-10-CM | POA: Diagnosis not present

## 2017-03-08 DIAGNOSIS — I6523 Occlusion and stenosis of bilateral carotid arteries: Secondary | ICD-10-CM | POA: Diagnosis not present

## 2017-03-08 MED ORDER — LORAZEPAM 0.5 MG PO TABS: 0.5000 mg | ORAL_TABLET | Freq: Every day | ORAL | Status: DC

## 2017-03-08 MED ORDER — TRAZODONE HCL 50 MG PO TABS: 50.0000 mg | ORAL_TABLET | Freq: Every day | ORAL | 3 refills | Status: DC

## 2017-03-08 NOTE — Progress Notes (Signed)
Careteam: Patient Care Team: Gayland Curry, DO as PCP - General (Geriatric Medicine) Milus Banister, MD as Attending Physician (Gastroenterology)  Advanced Directive information Does Patient Have a Medical Advance Directive?: Yes, Type of Advance Directive: [place];Out of facility DNR (pink MOST or yellow form), Pre-existing out of facility DNR order (yellow form or pink MOST form): Yellow form placed in chart (order not valid for inpatient use)  Allergies  Allergen Reactions  . Celebrex [Celecoxib] Other (See Comments)    Raised B/P   . Codeine Other (See Comments)    Severe stomach pain     Chief Complaint  Patient presents with  . Medical Management of Chronic Issues    one week follow-up on ears     HPI: Patient is a 83 y.o. male seen in the office today to follow up cerumen impaction and sleep.  Started trazodone and it has been working good but feels like it could be better Did not take it last night and was up at 4 am.  Pt cut back to 1 tablet of lorazepam.   Has been using debrox to both ears for cerumen impaction  Review of Systems:  Review of Systems  Constitutional: Negative for chills, fever and malaise/fatigue.  HENT: Positive for hearing loss. Negative for congestion.   Eyes: Negative for blurred vision.  Respiratory: Negative for cough and shortness of breath.   Cardiovascular: Negative for chest pain, palpitations and leg swelling.  Skin: Negative for itching and rash.  Neurological: Negative for dizziness and weakness.  Endo/Heme/Allergies: Does not bruise/bleed easily.  Psychiatric/Behavioral: Positive for memory loss. Negative for depression. The patient is nervous/anxious and has insomnia.     Past Medical History:  Diagnosis Date  . Abdominal aneurysm without mention of rupture   . Anginal pain ([place])    recent hospitalization  . Anxiety   . Arthritis   . Cancer [facility]) Jan. 2017   skin cancer removal/ Right medial  lower Leg  . Colon polyps   . COPD (chronic obstructive pulmonary disease) ([place])   . Diverticulosis of colon (without mention of hemorrhage)   . H/O hiatal hernia   . Hyperlipidemia   . Inguinal hernia   . Insomnia, unspecified    Past Surgical History:  Procedure Laterality Date  . ABDOMINAL AORTIC ENDOVASCULAR STENT GRAFT N/A 10/29/2013   Procedure: ABDOMINAL AORTIC ENDOVASCULAR STENT GRAFT;  Surgeon: [name], MD;  Location: [place];  Service: Vascular;  Laterality: N/A;  . bilateral shoulder repair    . CHOLECYSTECTOMY  1962  . COLON SURGERY  2007  . HERNIA REPAIR    . IR GENERIC HISTORICAL  12/24/2014   IR RADIOLOGIST EVAL & MGMT 12/24/2014 Jacqulynn Cadet, MD [facility] INTERV RAD  . MOHS SURGERY Right 11/2015   lower leg  . NASAL SEPTUM SURGERY  2011  . VASECTOMY  1962   Social History:   reports that he quit smoking about 32 years ago. His smoking use included Cigarettes. He has a 18.75 pack-year smoking history. He has never used smokeless tobacco. He reports that he drinks about 0.6 oz of alcohol per week . He reports that he does not use drugs.  Family History  Problem Relation Age of Onset  . Heart attack Father 29  . Colon cancer Neg Hx     Medications: Patient's Medications  New Prescriptions   No medications on file  Previous Medications   ASPIRIN 81 MG TABLET    Take  81 mg by mouth daily.   B COMPLEX-C (SUPER B COMPLEX PO)    Take 1 tablet by mouth daily.    CHLORPHENIRAMINE (CHLOR-TRIMETON) 4 MG TABLET    Take 4-8 mg by mouth every 6 (six) hours as needed for allergies.    CHOLECALCIFEROL (VITAMIN D) 2000 UNITS CAPS    Take 2,000 Units by mouth daily.    FEXOFENADINE (ALLEGRA) 180 MG TABLET    Take 1 tablet (180 mg total) by mouth daily.   GLUCOSAMINE-CHONDROIT-VIT C-MN (GLUCOSAMINE CHONDR 1500 COMPLX PO)    Take 1 capsule by mouth daily.   KETOCONAZOLE (NIZORAL) 2 % CREAM    Apply 1 application topically daily.   LORAZEPAM (ATIVAN) 1 MG TABLET    TAKE 1.5  TABLETS BY MOUTH EVERY NIGHT AT BEDTIME   MECLIZINE (ANTIVERT) 25 MG TABLET    Take 1 tablet (25 mg total) by mouth 3 (three) times daily as needed for dizziness.   MELATONIN 3 MG TABS    Take 2 tablets by mouth at bedtime.    MULTIPLE VITAMIN (MULTIVITAMIN) TABLET    Take 1 tablet by mouth daily.   NIACIN 100 MG TABLET    Take 250 mg by mouth daily with breakfast.    OMEGA-3 FATTY ACIDS (FISH OIL) 1000 MG CAPS    Take 1 capsule by mouth daily.   OVER THE COUNTER MEDICATION    Place 1 drop into both eyes every morning. OTC Artificial Tears eyedrops   SIMVASTATIN (ZOCOR) 40 MG TABLET    TAKE 1 TABLET BY MOUTH AT BEDTIME FOR CHOLESTEROL   TRAZODONE (DESYREL) 50 MG TABLET    Start with 1/2 tablet nightly for sleep, may increase to 1 tablet nightly   VITAMIN E 400 UNIT CAPSULE    Take 400 Units by mouth daily.  Modified Medications   No medications on file  Discontinued Medications   No medications on file     Physical Exam:  Vitals:   03/08/17 1426  BP: 140/88  Pulse: 72  Temp: 98 F (36.7 C)  TempSrc: Oral  SpO2: 94%  Weight: 187 lb 12.8 oz (85.2 kg)  Height: 5\' 8"  (1.727 m)   Body mass index is 28.55 kg/m.  Physical Exam  Constitutional: He is oriented to person, place, and time. He appears well-developed and well-nourished. No distress.  HENT:  Cerumen impaction bilaterally  Cardiovascular: Normal rate and regular rhythm.   Murmur heard. Pulmonary/Chest: Effort normal and breath sounds normal. No respiratory distress. He has no wheezes.  Abdominal: Soft. Bowel sounds are normal. He exhibits no distension.  Musculoskeletal: Normal range of motion. He exhibits no tenderness.     Neurological: He is alert and oriented to person, place, and time. No cranial nerve deficit.  Skin: Skin is warm and dry.  Psychiatric: He has a normal mood and affect.    Labs reviewed: Basic Metabolic Panel:  Recent Labs  05/03/16 0832 11/14/16 0941 02/23/17 1058 02/23/17 1143  NA 141  141 140 144  K 4.2 4.8 3.9 4.6  CL 107 108 110 107  CO2 25 25 22   --   GLUCOSE 91 108* 127* 129*  BUN 19 21 21* 28*  CREATININE 1.56* 1.64* 1.71* 1.60*  CALCIUM 8.6 9.3 9.3  --    Liver Function Tests:  Recent Labs  05/03/16 0832 11/14/16 0941 02/23/17 1058  AST 24 23 28   ALT 22 19 20   ALKPHOS 64 55 58  BILITOT 0.4 0.5 0.9  PROT 6.8 7.0  7.0  ALBUMIN 3.9 3.8 3.8    Recent Labs  02/23/17 1058  LIPASE 26   No results for input(s): AMMONIA in the last 8760 hours. CBC:  Recent Labs  05/03/16 0832 11/14/16 0941 02/23/17 1058 02/23/17 1143  WBC 5.9 6.5 6.4  --   NEUTROABS 3,068 3,510 3.7  --   HGB 12.8* 13.8 13.1 11.2*  HCT 37.9* 40.8 38.2* 33.0*  MCV 93.6 93.4 91.8  --   PLT 156 180 172  --    Lipid Panel:  Recent Labs  05/03/16 0832 11/14/16 0941  CHOL 140 154  HDL 41 40*  LDLCALC 76 79  TRIG 117 175*  CHOLHDL 3.4 3.9   TSH: No results for input(s): TSH in the last 8760 hours. A1C: No results found for: HGBA1C   Assessment/Plan 1. Insomnia, unspecified type Improved on trazodone, to reduce lorazepam to 0.5 mg daily and increase trazodone to 50 mg daily  - LORazepam (ATIVAN) 0.5 MG tablet; Take 1 tablet (0.5 mg total) by mouth at bedtime.**prescription was not given to dose was changed in computer.  - traZODone (DESYREL) 50 MG tablet; Take 1 tablet (50 mg total) by mouth at bedtime.  Dispense: 30 tablet; Refill: 3  2. Bilateral impacted cerumen - Ear Lavage bilaterally with good effect     Coryn Mosso K. Harle Battiest  [facility] [phone] 8 am - 5 pm) [phone] (after hours)

## 2017-03-08 NOTE — Patient Instructions (Addendum)
Increase trazodone to 1 tablet -- 50 mg  Decrease lorazepam to 1/2 tablet-- 0.5 mg   Follow up in 1 month with Dr Mariea Clonts on sleep

## 2017-03-14 ENCOUNTER — Telehealth: Payer: Self-pay | Admitting: *Deleted

## 2017-03-14 NOTE — Telephone Encounter (Signed)
Interesting b/c it was working great was he saw her.  I'll send to her to see what she thinks!

## 2017-03-14 NOTE — Telephone Encounter (Signed)
Patient called and stated that he saw Janett Billow 5/17 for Insomnia and Janett Billow changed his medication to  Lorazepam 0.5mg  daily Trazodone 50mg  daily Patient states that this is not helping, still cannot sleep at night. Patient states he needs something else done so he can sleep. Please Advise.

## 2017-03-15 ENCOUNTER — Encounter: Payer: Self-pay | Admitting: Family

## 2017-03-15 NOTE — Telephone Encounter (Signed)
Lets cont lorazepam and trazodone at current dose and add melatonin 5 mg at bedtime  Lets give this a few days to see if this helps

## 2017-03-15 NOTE — Telephone Encounter (Signed)
Spoke with patient, patient states if we will review his medication list we would see that he is taking 6 mg of melatonin at night x 1.5 years   Please advise

## 2017-03-15 NOTE — Telephone Encounter (Signed)
Spoke with [name] and let him know to continue taking trazodone and melatonin the same as he had before. Informed him that he could increase his current dose of lorazepam 0.5 mg  as needed for sleep. Instructed the patient  to wait 1 hour after his first dose of lorazepam and then take the additional 0.5 mg  for sleep. Patient denied having any further questions or concerns at this time.

## 2017-03-15 NOTE — Telephone Encounter (Signed)
Left message on voicemail for patient to return call when available   

## 2017-03-15 NOTE — Telephone Encounter (Signed)
Would like to try to keep lorazepam at current dose, however if he is unable to sleep may take another 0.5mg  after 1 hour if needed for sleep. To cont melatonin and trazodone

## 2017-03-17 ENCOUNTER — Other Ambulatory Visit: Payer: Self-pay | Admitting: Internal Medicine

## 2017-03-20 MED ORDER — LORAZEPAM 0.5 MG PO TABS: 0.5000 mg | ORAL_TABLET | Freq: Every day | ORAL | 0 refills | Status: DC

## 2017-03-20 NOTE — Addendum Note (Signed)
Addended by: Ripley Fraise on: 03/20/2017 04:15 PM   Modules accepted: Orders

## 2017-03-20 NOTE — Telephone Encounter (Signed)
Rx was for #30, Andrew Vega wanted to write it for #60, reprint Rx for 3th time.

## 2017-03-20 NOTE — Addendum Note (Signed)
Addended by: Ripley Fraise on: 03/20/2017 02:07 PM   Modules accepted: Orders

## 2017-03-29 ENCOUNTER — Telehealth: Payer: Self-pay | Admitting: *Deleted

## 2017-03-29 NOTE — Telephone Encounter (Signed)
Patient called and requested to speak with Janett Billow regarding change in medication and issues with it. Stated you gave him the Trazodone and Lorazepam. Asked him about his concerns with the change and he stated he did not want to talk with me about it because I would not be able to keep up with it and only wants to speak with you directly. I asked him again to tell me his concerns so Janett Billow would be able to know what she was calling him back about and again he stated he would rather not talk to me about it because I do not know him like Janett Billow. Stated again he would rather to speak only to [place]. Please call [phone]

## 2017-03-29 NOTE — Telephone Encounter (Signed)
Appointment scheduled for Monday. 

## 2017-03-29 NOTE — Telephone Encounter (Signed)
Please have him make appt for Monday

## 2017-04-02 ENCOUNTER — Ambulatory Visit (INDEPENDENT_AMBULATORY_CARE_PROVIDER_SITE_OTHER): Payer: Medicare Other | Admitting: Nurse Practitioner

## 2017-04-02 ENCOUNTER — Encounter: Payer: Self-pay | Admitting: Nurse Practitioner

## 2017-04-02 ENCOUNTER — Other Ambulatory Visit: Payer: Self-pay | Admitting: Nurse Practitioner

## 2017-04-02 VITALS — BP 136/84 | HR 83 | Temp 97.3°F | Resp 17 | Ht 68.0 in | Wt 183.6 lb

## 2017-04-02 DIAGNOSIS — F32A Depression, unspecified: Secondary | ICD-10-CM

## 2017-04-02 DIAGNOSIS — I6523 Occlusion and stenosis of bilateral carotid arteries: Secondary | ICD-10-CM

## 2017-04-02 DIAGNOSIS — G4700 Insomnia, unspecified: Secondary | ICD-10-CM

## 2017-04-02 DIAGNOSIS — F329 Major depressive disorder, single episode, unspecified: Secondary | ICD-10-CM

## 2017-04-02 DIAGNOSIS — F419 Anxiety disorder, unspecified: Secondary | ICD-10-CM

## 2017-04-02 MED ORDER — LORAZEPAM 0.5 MG PO TABS: 0.5000 mg | ORAL_TABLET | Freq: Every day | ORAL | 0 refills | Status: DC

## 2017-04-02 MED ORDER — TRAZODONE HCL 100 MG PO TABS: 100.0000 mg | ORAL_TABLET | Freq: Every day | ORAL | 1 refills | Status: DC

## 2017-04-02 NOTE — Patient Instructions (Addendum)
To take melatonin, trazodone 100 mg and Lorazepam 0.5 mg at night at night before you start getting ready for bed (10 pm)   If you wake up in the middle of the night may take another dose of Lorazepam to help you get back to sleep

## 2017-04-02 NOTE — Progress Notes (Signed)
Careteam: Patient Care Team: Gayland Curry, DO as PCP - General (Geriatric Medicine) Milus Banister, MD as Attending Physician (Gastroenterology)  Advanced Directive information Does Patient Have a Medical Advance Directive?: Yes, Type of Advance Directive: [place];Out of facility DNR (pink MOST or yellow form), Pre-existing out of facility DNR order (yellow form or pink MOST form): Yellow form placed in chart (order not valid for inpatient use)  Allergies  Allergen Reactions  . Celebrex [Celecoxib] Other (See Comments)    Raised B/P   . Codeine Other (See Comments)    Severe stomach pain     Chief Complaint  Patient presents with  . Acute Visit    Pt is being seen due to concerns that the medications he was prescribed for sleep are not working. He states that he is still waking up during the early AM hours and cannot get back to sleep. He states that he very fatigued throughout the day.      HPI: Patient is a 83 y.o. male seen in the office today due to not sleeping well.  Taking 1/2 tablet  lorazepam and then 1/2 tablet an hour later.  Also taking trazodone 50 mg daily and melatonin 6 mg  Getting up at 4 am to use restroom and can not go back to sleep.   Pt reports he is under a lot of stress at home. Wife with advanced dementia and caretakers around the clock. Wife's daughter and her husband come in on the weekends but only say hey and bye in passing, do not include him in daily conversation or activity. The Monday-Friday caregiver does not communicate with him at all. He is very lonely in his current situation. He is in frequent communication with his son and looking  into moving up [place] with his sons and daughter but has been given a lot of push back from his wife's daughter about leaving her and already feels guilty about this.   Review of Systems:  Review of Systems  Constitutional: Negative for chills, fever and malaise/fatigue.  HENT: Positive  for hearing loss. Negative for congestion.   Eyes: Negative for blurred vision.  Respiratory: Negative for cough and shortness of breath.   Cardiovascular: Negative for chest pain, palpitations and leg swelling.  Skin: Negative for itching and rash.  Neurological: Negative for dizziness and weakness.  Endo/Heme/Allergies: Does not bruise/bleed easily.  Psychiatric/Behavioral: Positive for depression and memory loss. Negative for suicidal ideas. The patient is nervous/anxious and has insomnia.     Past Medical History:  Diagnosis Date  . Abdominal aneurysm without mention of rupture   . Anginal pain ([place])    recent hospitalization  . Anxiety   . Arthritis   . Cancer [facility]) Jan. 2017   skin cancer removal/ Right medial lower Leg  . Colon polyps   . COPD (chronic obstructive pulmonary disease) ([place])   . Diverticulosis of colon (without mention of hemorrhage)   . H/O hiatal hernia   . Hyperlipidemia   . Inguinal hernia   . Insomnia, unspecified    Past Surgical History:  Procedure Laterality Date  . ABDOMINAL AORTIC ENDOVASCULAR STENT GRAFT N/A 10/29/2013   Procedure: ABDOMINAL AORTIC ENDOVASCULAR STENT GRAFT;  Surgeon: [name], MD;  Location: [place];  Service: Vascular;  Laterality: N/A;  . bilateral shoulder repair    . CHOLECYSTECTOMY  1962  . COLON SURGERY  2007  . HERNIA REPAIR    . IR GENERIC HISTORICAL  12/24/2014   IR RADIOLOGIST EVAL & MGMT 12/24/2014 Jacqulynn Cadet, MD [facility] INTERV RAD  . MOHS SURGERY Right 11/2015   lower leg  . NASAL SEPTUM SURGERY  2011  . VASECTOMY  1962   Social History:   reports that he quit smoking about 32 years ago. His smoking use included Cigarettes. He has a 18.75 pack-year smoking history. He has never used smokeless tobacco. He reports that he drinks about 0.6 oz of alcohol per week . He reports that he does not use drugs.  Family History  Problem Relation Age of Onset  . Heart attack Father 72  . Colon cancer Neg Hx      Medications: Patient's Medications  New Prescriptions   No medications on file  Previous Medications   ASPIRIN 81 MG TABLET    Take 81 mg by mouth daily.   B COMPLEX-C (SUPER B COMPLEX PO)    Take 1 tablet by mouth daily.    CHLORPHENIRAMINE (CHLOR-TRIMETON) 4 MG TABLET    Take 4-8 mg by mouth every 6 (six) hours as needed for allergies.    CHOLECALCIFEROL (VITAMIN D) 2000 UNITS CAPS    Take 2,000 Units by mouth daily.    FEXOFENADINE (ALLEGRA) 180 MG TABLET    Take 1 tablet (180 mg total) by mouth daily.   GLUCOSAMINE-CHONDROIT-VIT C-MN (GLUCOSAMINE CHONDR 1500 COMPLX PO)    Take 1 capsule by mouth daily.   KETOCONAZOLE (NIZORAL) 2 % CREAM    Apply 1 application topically daily.   LORAZEPAM (ATIVAN) 0.5 MG TABLET    Take 1 tablet (0.5 mg total) by mouth at bedtime. Take 1 tablet (0.5 mg) by mouth daily at bedtime, may also  take an addition 1 tablet (0.5mg ) by mouth as needed 1 hour after dose if still unable to sleep.   MECLIZINE (ANTIVERT) 25 MG TABLET    Take 1 tablet (25 mg total) by mouth 3 (three) times daily as needed for dizziness.   MELATONIN 3 MG TABS    Take 2 tablets by mouth at bedtime.    MULTIPLE VITAMIN (MULTIVITAMIN) TABLET    Take 1 tablet by mouth daily.   NIACIN 100 MG TABLET    Take 250 mg by mouth daily with breakfast.    OMEGA-3 FATTY ACIDS (FISH OIL) 1000 MG CAPS    Take 1 capsule by mouth daily.   OVER THE COUNTER MEDICATION    Place 1 drop into both eyes every morning. OTC Artificial Tears eyedrops   SIMVASTATIN (ZOCOR) 40 MG TABLET    TAKE 1 TABLET BY MOUTH AT BEDTIME FOR CHOLESTEROL   TRAZODONE (DESYREL) 50 MG TABLET    Take 1 tablet (50 mg total) by mouth at bedtime.   VITAMIN E 400 UNIT CAPSULE    Take 400 Units by mouth daily.  Modified Medications   No medications on file  Discontinued Medications   No medications on file     Physical Exam:  Vitals:   04/02/17 1023  BP: 136/84  Pulse: 83  Resp: 17  Temp: 97.3 F (36.3 C)  TempSrc: Oral   SpO2: 96%  Weight: 183 lb 9.6 oz (83.3 kg)  Height: 5\' 8"  (1.727 m)   Body mass index is 27.92 kg/m.  Physical Exam  Constitutional: He is oriented to person, place, and time. He appears well-developed and well-nourished. No distress.  Neurological: He is alert and oriented to person, place, and time. No cranial nerve deficit.  Skin: Skin is warm and dry.  Psychiatric: He has a normal mood and affect.    Labs reviewed: Basic Metabolic Panel:  Recent Labs  05/03/16 0832 11/14/16 0941 02/23/17 1058 02/23/17 1143  NA 141 141 140 144  K 4.2 4.8 3.9 4.6  CL 107 108 110 107  CO2 25 25 22   --   GLUCOSE 91 108* 127* 129*  BUN 19 21 21* 28*  CREATININE 1.56* 1.64* 1.71* 1.60*  CALCIUM 8.6 9.3 9.3  --    Liver Function Tests:  Recent Labs  05/03/16 0832 11/14/16 0941 02/23/17 1058  AST 24 23 28   ALT 22 19 20   ALKPHOS 64 55 58  BILITOT 0.4 0.5 0.9  PROT 6.8 7.0 7.0  ALBUMIN 3.9 3.8 3.8    Recent Labs  02/23/17 1058  LIPASE 26   No results for input(s): AMMONIA in the last 8760 hours. CBC:  Recent Labs  05/03/16 0832 11/14/16 0941 02/23/17 1058 02/23/17 1143  WBC 5.9 6.5 6.4  --   NEUTROABS 3,068 3,510 3.7  --   HGB 12.8* 13.8 13.1 11.2*  HCT 37.9* 40.8 38.2* 33.0*  MCV 93.6 93.4 91.8  --   PLT 156 180 172  --    Lipid Panel:  Recent Labs  05/03/16 0832 11/14/16 0941  CHOL 140 154  HDL 41 40*  LDLCALC 76 79  TRIG 117 175*  CHOLHDL 3.4 3.9   TSH: No results for input(s): TSH in the last 8760 hours. A1C: No results found for: HGBA1C   Assessment/Plan 1. Insomnia, unspecified type Multifactorial. He is under a great deal of stress at home and has situational anxiety and depression. Suspect sleep will improve once he moves closer to family -to increase trazodone to 100 mg at night -to take lorazepam 1 tablet at bedtime and may take an additional tablet if he gets up in the middle of the night -if this does not work would recommend increase  melatonin to 10 mg and maybe decreasing lorazepam further - traZODone (DESYREL) 100 MG tablet; Take 1 tablet (100 mg total) by mouth at bedtime.  Dispense: 30 tablet; Refill: 1 - LORazepam (ATIVAN) 0.5 MG tablet; Take 1 tablet (0.5 mg total) by mouth at bedtime. Take 1 tablet (0.5 mg) by mouth daily at bedtime, may also  take an addition 1 tablet (0.5mg ) by mouth as needed in the middle of the night for sleep  Dispense: 60 tablet; Refill: 0  2. Anxiety and depression Lot of stress with his current living situation, feeling very lonely in his current home.  -hopefully with the increase in trazodone anxiety and depression will improve however it will significantly improve until his living arrangement changes.    To keep follow up with Dr Sharee Holster K. Harle Battiest  [facility] [phone] 8 am - 5 pm) [phone] (after hours)

## 2017-04-06 ENCOUNTER — Encounter: Payer: Self-pay | Admitting: Interventional Radiology

## 2017-04-09 ENCOUNTER — Encounter: Payer: Self-pay | Admitting: Internal Medicine

## 2017-04-09 ENCOUNTER — Ambulatory Visit (INDEPENDENT_AMBULATORY_CARE_PROVIDER_SITE_OTHER): Payer: Medicare Other | Admitting: Internal Medicine

## 2017-04-09 VITALS — BP 140/80 | HR 68 | Temp 98.9°F | Wt 184.0 lb

## 2017-04-09 DIAGNOSIS — F32A Depression, unspecified: Secondary | ICD-10-CM | POA: Insufficient documentation

## 2017-04-09 DIAGNOSIS — I6523 Occlusion and stenosis of bilateral carotid arteries: Secondary | ICD-10-CM | POA: Diagnosis not present

## 2017-04-09 DIAGNOSIS — F329 Major depressive disorder, single episode, unspecified: Secondary | ICD-10-CM | POA: Diagnosis not present

## 2017-04-09 DIAGNOSIS — G4700 Insomnia, unspecified: Secondary | ICD-10-CM

## 2017-04-09 DIAGNOSIS — J301 Allergic rhinitis due to pollen: Secondary | ICD-10-CM | POA: Diagnosis not present

## 2017-04-09 DIAGNOSIS — F419 Anxiety disorder, unspecified: Secondary | ICD-10-CM | POA: Diagnosis not present

## 2017-04-09 NOTE — Progress Notes (Signed)
Location:  [facility] clinic Provider:  Pamalee Marcoe L. Mariea Clonts, D.O., C.M.D.  Code Status: DNR Goals of Care:  Advanced Directives 04/02/2017  Does Patient Have a Medical Advance Directive? Yes  Type of Paramedic of [place];Out of facility DNR (pink MOST or yellow form)  Does patient want to make changes to medical advance directive? -  Copy of [place] in Chart? Yes  Pre-existing out of facility DNR order (yellow form or pink MOST form) Yellow form placed in chart (order not valid for inpatient use)     Chief Complaint  Patient presents with  . Follow-up    1MTH FOLLOW-UP ON SLEEP    HPI: Patient is a 83 y.o. male seen today for follow up on sleep.    He says the new sleep regimen with trazodone 100mg  at hs and 0.5mg  ativan and melatonin 3mg .  Occasionally taking an additional 0.5mg  but only when wakes up at 3am.  Says he is not longer feeling sleep deprived or upset anymore. Able to fall asleep in 30 mins and can sleep until 6:30/7am. Sometimes has 1/2 caf coffee on Friday mornings to help with sleepy, but not sleep deprived.  Forgot to bring his pills.    Reviewed chlorpheniramine 4mg  daily as needed.  No balance problems.    Memory:  Does have difficulty with names of things.  Meds he has trouble remembering it seems, but he reports no problems taking them as directed.    Has his second shingles vaccine in June at [facility], but now talking about getting it at [facility] instead--we'll see what he decides.    Past Medical History:  Diagnosis Date  . Abdominal aneurysm without mention of rupture   . Anginal pain ([place])    recent hospitalization  . Anxiety   . Arthritis   . Cancer [facility]) Jan. 2017   skin cancer removal/ Right medial lower Leg  . Colon polyps   . COPD (chronic obstructive pulmonary disease) ([place])   . Diverticulosis of colon (without mention of hemorrhage)   . H/O hiatal hernia   . Hyperlipidemia   . Inguinal hernia   .  Insomnia, unspecified     Past Surgical History:  Procedure Laterality Date  . ABDOMINAL AORTIC ENDOVASCULAR STENT GRAFT N/A 10/29/2013   Procedure: ABDOMINAL AORTIC ENDOVASCULAR STENT GRAFT;  Surgeon: [name], MD;  Location: [place];  Service: Vascular;  Laterality: N/A;  . bilateral shoulder repair    . CHOLECYSTECTOMY  1962  . COLON SURGERY  2007  . HERNIA REPAIR    . IR GENERIC HISTORICAL  12/24/2014   IR RADIOLOGIST EVAL & MGMT 12/24/2014 Jacqulynn Cadet, MD [facility] INTERV RAD  . IR RADIOLOGIST EVAL & MGMT  02/27/2017  . MOHS SURGERY Right 11/2015   lower leg  . NASAL SEPTUM SURGERY  2011  . VASECTOMY  1962    Allergies  Allergen Reactions  . Celebrex [Celecoxib] Other (See Comments)    Raised B/P   . Codeine Other (See Comments)    Severe stomach pain     Allergies as of 04/09/2017      Reactions   Celebrex [celecoxib] Other (See Comments)   Raised B/P    Codeine Other (See Comments)   Severe stomach pain       Medication List       Accurate as of 04/09/17  2:39 PM. Always use your most recent med list.          aspirin  81 MG tablet Take 81 mg by mouth daily.   CHLOR-TRIMETON 4 MG tablet Generic drug:  chlorpheniramine Take 4-8 mg by mouth every 6 (six) hours as needed for allergies.   fexofenadine 180 MG tablet Commonly known as:  ALLEGRA Take 1 tablet (180 mg total) by mouth daily.   Fish Oil 1000 MG Caps Take 1 capsule by mouth daily.   GLUCOSAMINE CHONDR 1500 COMPLX PO Take 1 capsule by mouth daily.   ketoconazole 2 % cream Commonly known as:  NIZORAL Apply 1 application topically daily.   LORazepam 0.5 MG tablet Commonly known as:  ATIVAN Take 0.5 mg by mouth at bedtime. Take an additional 0.5 mg tablet prn if no sleep   Melatonin 3 MG Tabs Take 2 tablets by mouth at bedtime.   multivitamin tablet Take 1 tablet by mouth daily.   niacin 100 MG tablet Take 250 mg by mouth daily with breakfast.   simvastatin 40 MG tablet Commonly known  as:  ZOCOR TAKE 1 TABLET BY MOUTH AT BEDTIME FOR CHOLESTEROL   SUPER B COMPLEX PO Take 1 tablet by mouth daily.   traZODone 100 MG tablet Commonly known as:  DESYREL Take 1 tablet (100 mg total) by mouth at bedtime.   Vitamin D 2000 units Caps Take 2,000 Units by mouth daily.   vitamin E 400 UNIT capsule Take 400 Units by mouth daily.       Review of Systems:  Review of Systems  Constitutional: Negative for chills and fever.  HENT: Negative for congestion.   Eyes: Negative for blurred vision.  Respiratory: Negative for cough and shortness of breath.   Cardiovascular: Negative for chest pain, palpitations and leg swelling.  Gastrointestinal: Negative for abdominal pain, blood in stool, constipation, diarrhea and melena.  Genitourinary: Negative for dysuria.  Musculoskeletal: Negative for falls, myalgias and neck pain.  Skin: Positive for itching and rash.       Left ear  Neurological: Negative for dizziness and loss of consciousness.  Psychiatric/Behavioral: Positive for depression and memory loss. The patient is nervous/anxious and has insomnia.     Health Maintenance  Topic Date Due  . INFLUENZA VACCINE  05/23/2017  . TETANUS/TDAP  10/23/2021  . PNA vac Low Risk Adult  Completed    Physical Exam: Vitals:   04/09/17 1427  BP: 140/80  Pulse: 68  Temp: 98.9 F (37.2 C)  TempSrc: Oral  SpO2: 95%  Weight: 184 lb (83.5 kg)   Body mass index is 27.98 kg/m. Physical Exam  Constitutional: He is oriented to person, place, and time. He appears well-developed and well-nourished. No distress.  HENT:  Hearing aids  Cardiovascular: Normal rate, regular rhythm and intact distal pulses.   Murmur heard. High pitched systolic murmur  Pulmonary/Chest: Effort normal and breath sounds normal. No respiratory distress.  Abdominal: Soft. Bowel sounds are normal.  Musculoskeletal: Normal range of motion. He exhibits no tenderness.  Neurological: He is alert and oriented to  person, place, and time.  Skin: Skin is warm and dry.  Erythema and abrasion of left ear  Psychiatric: He has a normal mood and affect.    Labs reviewed: Basic Metabolic Panel:  Recent Labs  05/03/16 0832 11/14/16 0941 02/23/17 1058 02/23/17 1143  NA 141 141 140 144  K 4.2 4.8 3.9 4.6  CL 107 108 110 107  CO2 25 25 22   --   GLUCOSE 91 108* 127* 129*  BUN 19 21 21* 28*  CREATININE 1.56* 1.64* 1.71* 1.60*  CALCIUM 8.6 9.3 9.3  --    Liver Function Tests:  Recent Labs  05/03/16 0832 11/14/16 0941 02/23/17 1058  AST 24 23 28   ALT 22 19 20   ALKPHOS 64 55 58  BILITOT 0.4 0.5 0.9  PROT 6.8 7.0 7.0  ALBUMIN 3.9 3.8 3.8    Recent Labs  02/23/17 1058  LIPASE 26   No results for input(s): AMMONIA in the last 8760 hours. CBC:  Recent Labs  05/03/16 0832 11/14/16 0941 02/23/17 1058 02/23/17 1143  WBC 5.9 6.5 6.4  --   NEUTROABS 3,068 3,510 3.7  --   HGB 12.8* 13.8 13.1 11.2*  HCT 37.9* 40.8 38.2* 33.0*  MCV 93.6 93.4 91.8  --   PLT 156 180 172  --    Lipid Panel:  Recent Labs  05/03/16 0832 11/14/16 0941  CHOL 140 154  HDL 41 40*  LDLCALC 76 79  TRIG 117 175*  CHOLHDL 3.4 3.9   Assessment/Plan 1. Insomnia, unspecified type -cont current regimen which seems effective for him -mentioned that Janett Billow and I have been concerned that his memory is slipping some while taking some of these medications which is why we were trying to reduce the lorazepam gradually (which did not work) even after trazodone was added -also chlorpheniramine is anticholinergic, but he fortunately takes daily not 1-2 four times a day as had been ordered by allergy  2. Anxiety and depression -seems satisfactory with current meds as best can expect given his wife's advanced dementia and potential need to move [place] with family himself (complicated situation b/c wife's daughter is upset that he wants to move and that will mean his wife will need to relocate also)  3. Non-seasonal  allergic rhinitis due to pollen -if can tolerate, will d/c chlorpheniramine next time  Labs/tests ordered:  No orders of the defined types were placed in this encounter.  Next appt:  07/09/2017   Mackinley Cassaday L. Amit Leece, D.O. [facility] [address], [place] [address] Cell Phone (Mon-Fri 8am-5pm):  [phone] On Call:  [phone] & follow prompts after 5pm & weekends Office Phone:  [phone] Office Fax:  [phone]

## 2017-04-19 ENCOUNTER — Other Ambulatory Visit: Payer: Self-pay | Admitting: *Deleted

## 2017-04-19 MED ORDER — LORAZEPAM 0.5 MG PO TABS: 0.5000 mg | ORAL_TABLET | Freq: Every day | ORAL | 0 refills | Status: DC

## 2017-04-19 NOTE — Telephone Encounter (Signed)
Patient called and requested refill on Lorazepam.  I called pharmacy[facility] and spoke with pharmacist and Rx was ready for refill. Refill requested given. Patient notified.

## 2017-04-26 ENCOUNTER — Ambulatory Visit (INDEPENDENT_AMBULATORY_CARE_PROVIDER_SITE_OTHER): Payer: Medicare Other | Admitting: Nurse Practitioner

## 2017-04-26 ENCOUNTER — Encounter: Payer: Self-pay | Admitting: Nurse Practitioner

## 2017-04-26 VITALS — BP 134/74 | HR 70 | Temp 97.9°F | Resp 18 | Ht 68.0 in | Wt 184.0 lb

## 2017-04-26 DIAGNOSIS — J301 Allergic rhinitis due to pollen: Secondary | ICD-10-CM

## 2017-04-26 DIAGNOSIS — I6523 Occlusion and stenosis of bilateral carotid arteries: Secondary | ICD-10-CM | POA: Diagnosis not present

## 2017-04-26 NOTE — Progress Notes (Signed)
Careteam: Patient Care Team: Gayland Curry, DO as PCP - General (Geriatric Medicine) Milus Banister, MD as Attending Physician (Gastroenterology)  Advanced Directive information Type of Advance Directive: [place];Living will;Out of facility DNR (pink MOST or yellow form), Pre-existing out of facility DNR order (yellow form or pink MOST form): Yellow form placed in chart (order not valid for inpatient use)  Allergies  Allergen Reactions  . Celebrex [Celecoxib] Other (See Comments)    Raised B/P   . Codeine Other (See Comments)    Severe stomach pain     Chief Complaint  Patient presents with  . Acute Visit    Pt is being seen due to having muffled sounds in left ear. Pt states it comes and goes over last few days.   . Other    Passed clock drawing     HPI: Patient is a 83 y.o. male seen in the office today due to muffling sensation in left ear.  Feels like when he shuts his eyes its better.  No pain to ear.  Does not have hearing aids today Reports he is not dizzy but feels like it would not take much for him to be dizzy.  No ringing in ears Denies congestion, runny nose, watery eyes.  No recent illness.  Has hearing loss.  Taking allegra daily and has been for 5 years   Review of Systems:  Review of Systems  Constitutional: Negative for chills, fever and malaise/fatigue.  HENT: Positive for hearing loss. Negative for congestion, ear discharge, ear pain, sinus pain, sore throat and tinnitus.   Eyes: Positive for redness (chronic). Negative for pain and discharge.  Respiratory: Negative for cough.   Cardiovascular: Negative for chest pain.  Neurological: Negative for weakness.  Psychiatric/Behavioral: Positive for depression. The patient does not have insomnia.     Past Medical History:  Diagnosis Date  . Abdominal aneurysm without mention of rupture   . Anginal pain ([place])    recent hospitalization  . Anxiety   . Arthritis   . Cancer  [facility]) Jan. 2017   skin cancer removal/ Right medial lower Leg  . Colon polyps   . COPD (chronic obstructive pulmonary disease) ([place])   . Diverticulosis of colon (without mention of hemorrhage)   . H/O hiatal hernia   . Hyperlipidemia   . Inguinal hernia   . Insomnia, unspecified    Past Surgical History:  Procedure Laterality Date  . ABDOMINAL AORTIC ENDOVASCULAR STENT GRAFT N/A 10/29/2013   Procedure: ABDOMINAL AORTIC ENDOVASCULAR STENT GRAFT;  Surgeon: [name], MD;  Location: [place];  Service: Vascular;  Laterality: N/A;  . bilateral shoulder repair    . CHOLECYSTECTOMY  1962  . COLON SURGERY  2007  . HERNIA REPAIR    . IR GENERIC HISTORICAL  12/24/2014   IR RADIOLOGIST EVAL & MGMT 12/24/2014 Jacqulynn Cadet, MD [facility] INTERV RAD  . IR RADIOLOGIST EVAL & MGMT  02/27/2017  . MOHS SURGERY Right 11/2015   lower leg  . NASAL SEPTUM SURGERY  2011  . VASECTOMY  1962   Social History:   reports that he quit smoking about 32 years ago. His smoking use included Cigarettes. He has a 18.75 pack-year smoking history. He has never used smokeless tobacco. He reports that he drinks about 0.6 oz of alcohol per week . He reports that he does not use drugs.  Family History  Problem Relation Age of Onset  . Heart attack Father 77  .  Colon cancer Neg Hx     Medications: Patient's Medications  New Prescriptions   No medications on file  Previous Medications   ASPIRIN 81 MG TABLET    Take 81 mg by mouth daily.   B COMPLEX-C (SUPER B COMPLEX PO)    Take 1 tablet by mouth daily.    CHLORPHENIRAMINE (CHLOR-TRIMETON) 4 MG TABLET    Take 4 mg by mouth daily as needed for allergies.   CHOLECALCIFEROL (VITAMIN D) 2000 UNITS CAPS    Take 2,000 Units by mouth daily.    FEXOFENADINE (ALLEGRA) 180 MG TABLET    Take 1 tablet (180 mg total) by mouth daily.   GLUCOSAMINE-CHONDROIT-VIT C-MN (GLUCOSAMINE CHONDR 1500 COMPLX PO)    Take 1 capsule by mouth daily.   KETOCONAZOLE (NIZORAL) 2 % CREAM    Apply 1  application topically daily.   LORAZEPAM (ATIVAN) 0.5 MG TABLET    Take 1 tablet (0.5 mg total) by mouth at bedtime. Take an additional 0.5 mg tablet prn if no sleep   MELATONIN 3 MG TABS    Take 2 tablets by mouth at bedtime.    MULTIPLE VITAMIN (MULTIVITAMIN) TABLET    Take 1 tablet by mouth daily.   NIACIN 100 MG TABLET    Take 250 mg by mouth daily with breakfast.    OMEGA-3 FATTY ACIDS (FISH OIL) 1000 MG CAPS    Take 1 capsule by mouth daily.   SIMVASTATIN (ZOCOR) 40 MG TABLET    TAKE 1 TABLET BY MOUTH AT BEDTIME FOR CHOLESTEROL   TRAZODONE (DESYREL) 100 MG TABLET    Take 1 tablet (100 mg total) by mouth at bedtime.   VITAMIN E 400 UNIT CAPSULE    Take 400 Units by mouth daily.  Modified Medications   No medications on file  Discontinued Medications   No medications on file     Physical Exam:  Vitals:   04/26/17 1130  BP: 134/74  Pulse: 70  Resp: 18  Temp: 97.9 F (36.6 C)  TempSrc: Oral  SpO2: 97%  Weight: 184 lb (83.5 kg)  Height: 5\' 8"  (1.727 m)   Body mass index is 27.98 kg/m.  Physical Exam  Constitutional: He is oriented to person, place, and time. He appears well-developed and well-nourished. No distress.  HENT:  Right Ear: Tympanic membrane, external ear and ear canal normal. No drainage, swelling or tenderness. Decreased hearing is noted.  Left Ear: Ear canal normal. No drainage, swelling or tenderness. Decreased hearing is noted.  Hearing aids Limited visualization of TM on left due to flakes of wax. No cerumen impaction.   Eyes: Pupils are equal, round, and reactive to light. Right conjunctiva is injected (chronic).  Cardiovascular: Normal rate, regular rhythm and intact distal pulses.   Murmur heard. High pitched systolic murmur  Pulmonary/Chest: Effort normal and breath sounds normal. No respiratory distress.  Abdominal: Soft. Bowel sounds are normal.  Musculoskeletal: Normal range of motion. He exhibits no tenderness.  Neurological: He is alert and  oriented to person, place, and time.  Skin: Skin is warm and dry.  Erythema and abrasion of left ear  Psychiatric: He has a normal mood and affect.    Labs reviewed: Basic Metabolic Panel:  Recent Labs  05/03/16 0832 11/14/16 0941 02/23/17 1058 02/23/17 1143  NA 141 141 140 144  K 4.2 4.8 3.9 4.6  CL 107 108 110 107  CO2 25 25 22   --   GLUCOSE 91 108* 127* 129*  BUN 19 21  21* 28*  CREATININE 1.56* 1.64* 1.71* 1.60*  CALCIUM 8.6 9.3 9.3  --    Liver Function Tests:  Recent Labs  05/03/16 0832 11/14/16 0941 02/23/17 1058  AST 24 23 28   ALT 22 19 20   ALKPHOS 64 55 58  BILITOT 0.4 0.5 0.9  PROT 6.8 7.0 7.0  ALBUMIN 3.9 3.8 3.8    Recent Labs  02/23/17 1058  LIPASE 26   No results for input(s): AMMONIA in the last 8760 hours. CBC:  Recent Labs  05/03/16 0832 11/14/16 0941 02/23/17 1058 02/23/17 1143  WBC 5.9 6.5 6.4  --   NEUTROABS 3,068 3,510 3.7  --   HGB 12.8* 13.8 13.1 11.2*  HCT 37.9* 40.8 38.2* 33.0*  MCV 93.6 93.4 91.8  --   PLT 156 180 172  --    Lipid Panel:  Recent Labs  05/03/16 0832 11/14/16 0941  CHOL 140 154  HDL 41 40*  LDLCALC 76 79  TRIG 117 175*  CHOLHDL 3.4 3.9   TSH: No results for input(s): TSH in the last 8760 hours. A1C: No results found for: HGBA1C   Assessment/Plan 1. Non-seasonal allergic rhinitis due to pollen No signs of infection or cerumen impaction, Will start Cetirizine 10 mg daily and stop allegra to see if this helps with symptoms.   To notify if symptoms worsen or other symptoms occur.  Carlos American. Harle Battiest  [facility] [phone] 8 am - 5 pm) [phone] (after hours)

## 2017-04-26 NOTE — Patient Instructions (Signed)
Stop allergra and try Cetirizine (Brand name: Zyrtec) 10 mg daily

## 2017-05-21 ENCOUNTER — Other Ambulatory Visit: Payer: Self-pay | Admitting: *Deleted

## 2017-05-21 MED ORDER — LORAZEPAM 0.5 MG PO TABS: 0.5000 mg | ORAL_TABLET | Freq: Every day | ORAL | 0 refills | Status: DC

## 2017-05-21 NOTE — Telephone Encounter (Signed)
rx called into pharmacy

## 2017-06-18 ENCOUNTER — Other Ambulatory Visit: Payer: Self-pay | Admitting: *Deleted

## 2017-06-18 MED ORDER — LORAZEPAM 0.5 MG PO TABS: 0.5000 mg | ORAL_TABLET | Freq: Every day | ORAL | 0 refills | Status: DC

## 2017-06-27 ENCOUNTER — Other Ambulatory Visit: Payer: Medicare Other

## 2017-06-27 DIAGNOSIS — I714 Abdominal aortic aneurysm, without rupture, unspecified: Secondary | ICD-10-CM

## 2017-06-27 DIAGNOSIS — G4700 Insomnia, unspecified: Secondary | ICD-10-CM | POA: Diagnosis not present

## 2017-06-27 DIAGNOSIS — J301 Allergic rhinitis due to pollen: Secondary | ICD-10-CM | POA: Diagnosis not present

## 2017-06-27 DIAGNOSIS — L219 Seborrheic dermatitis, unspecified: Secondary | ICD-10-CM | POA: Diagnosis not present

## 2017-06-27 DIAGNOSIS — M5030 Other cervical disc degeneration, unspecified cervical region: Secondary | ICD-10-CM | POA: Diagnosis not present

## 2017-06-27 LAB — COMPLETE METABOLIC PANEL WITH GFR
AG Ratio: 1.4 (calc) (ref 1.0–2.5)
ALT: 16 U/L (ref 9–46)
AST: 19 U/L (ref 10–35)
Albumin: 4 g/dL (ref 3.6–5.1)
Alkaline phosphatase (APISO): 58 U/L (ref 40–115)
BUN/Creatinine Ratio: 14 (calc) (ref 6–22)
BUN: 22 mg/dL (ref 7–25)
CO2: 25 mmol/L (ref 20–32)
Calcium: 9.2 mg/dL (ref 8.6–10.3)
Chloride: 108 mmol/L (ref 98–110)
Creat: 1.59 mg/dL — ABNORMAL HIGH (ref 0.70–1.11)
GFR, Est African American: 44 mL/min/{1.73_m2} — ABNORMAL LOW (ref >=60)
GFR, Est Non African American: 38 mL/min/{1.73_m2} — ABNORMAL LOW (ref >=60)
Globulin: 2.9 g/dL (calc) (ref 1.9–3.7)
Glucose, Bld: 96 mg/dL (ref 65–99)
Potassium: 4.1 mmol/L (ref 3.5–5.3)
Sodium: 141 mmol/L (ref 135–146)
Total Bilirubin: 0.4 mg/dL (ref 0.2–1.2)
Total Protein: 6.9 g/dL (ref 6.1–8.1)

## 2017-06-27 LAB — CBC WITH DIFFERENTIAL/PLATELET
Basophils Absolute: 110 cells/uL (ref 0–200)
Basophils Relative: 1.8 %
Eosinophils Absolute: 268 cells/uL (ref 15–500)
Eosinophils Relative: 4.4 %
HCT: 38.1 % — ABNORMAL LOW (ref 38.5–50.0)
Hemoglobin: 13.1 g/dL — ABNORMAL LOW (ref 13.2–17.1)
Lymphs Abs: 1659 cells/uL (ref 850–3900)
MCH: 31.4 pg (ref 27.0–33.0)
MCHC: 34.4 g/dL (ref 32.0–36.0)
MCV: 91.4 fL (ref 80.0–100.0)
MPV: 11.9 fL (ref 7.5–12.5)
Monocytes Relative: 10 %
Neutro Abs: 3453 cells/uL (ref 1500–7800)
Neutrophils Relative %: 56.6 %
Platelets: 138 10*3/uL — ABNORMAL LOW (ref 140–400)
RBC: 4.17 10*6/uL — ABNORMAL LOW (ref 4.20–5.80)
RDW: 13.3 % (ref 11.0–15.0)
Total Lymphocyte: 27.2 %
WBC mixed population: 610 cells/uL (ref 200–950)
WBC: 6.1 10*3/uL (ref 3.8–10.8)

## 2017-06-28 ENCOUNTER — Encounter: Payer: Self-pay | Admitting: *Deleted

## 2017-06-28 ENCOUNTER — Other Ambulatory Visit: Payer: Medicare Other

## 2017-06-28 ENCOUNTER — Telehealth: Payer: Self-pay | Admitting: *Deleted

## 2017-06-28 NOTE — Telephone Encounter (Signed)
Patient calling asking that Dr. Mariea Clonts prescribe him a SSRI per the clinical social worker with [facility], Liam Graham [phone]. I advised to pt we would need some notes from the social worker and pt asked that I call. I called and left message asking social worker to return my call.

## 2017-06-28 NOTE — Telephone Encounter (Signed)
Message left on clinical intake voicemail:   Liam Graham (Therapist) called to inform Dr.Reed that she is patient's therapist and she instructed him to call his PCP to request SSRI

## 2017-06-29 NOTE — Telephone Encounter (Signed)
Would recommend an appt to discuss this in office

## 2017-07-02 ENCOUNTER — Ambulatory Visit: Payer: Medicare Other | Admitting: Internal Medicine

## 2017-07-03 NOTE — Telephone Encounter (Signed)
He's already been on an SSRI.  May need more or a change.  Agree with appt.

## 2017-07-04 NOTE — Telephone Encounter (Signed)
Spoke with patient and he has an appt scheduled 07/09/2017 and he will discuss then.

## 2017-07-09 ENCOUNTER — Ambulatory Visit (INDEPENDENT_AMBULATORY_CARE_PROVIDER_SITE_OTHER): Payer: Medicare Other | Admitting: Internal Medicine

## 2017-07-09 ENCOUNTER — Encounter: Payer: Self-pay | Admitting: Internal Medicine

## 2017-07-09 VITALS — BP 130/70 | HR 88 | Temp 98.5°F | Wt 190.0 lb

## 2017-07-09 DIAGNOSIS — F32A Depression, unspecified: Secondary | ICD-10-CM

## 2017-07-09 DIAGNOSIS — E785 Hyperlipidemia, unspecified: Secondary | ICD-10-CM | POA: Diagnosis not present

## 2017-07-09 DIAGNOSIS — G4700 Insomnia, unspecified: Secondary | ICD-10-CM

## 2017-07-09 DIAGNOSIS — Z23 Encounter for immunization: Secondary | ICD-10-CM | POA: Diagnosis not present

## 2017-07-09 DIAGNOSIS — I6523 Occlusion and stenosis of bilateral carotid arteries: Secondary | ICD-10-CM | POA: Diagnosis not present

## 2017-07-09 DIAGNOSIS — F329 Major depressive disorder, single episode, unspecified: Secondary | ICD-10-CM | POA: Diagnosis not present

## 2017-07-09 DIAGNOSIS — F419 Anxiety disorder, unspecified: Secondary | ICD-10-CM

## 2017-07-09 DIAGNOSIS — J301 Allergic rhinitis due to pollen: Secondary | ICD-10-CM | POA: Diagnosis not present

## 2017-07-09 MED ORDER — TRAZODONE HCL 50 MG PO TABS: 150.0000 mg | ORAL_TABLET | Freq: Every day | ORAL | 3 refills | Status: DC

## 2017-07-09 NOTE — Patient Instructions (Addendum)
Increase your trazodone to 150mg  at bedtime (take 3 tablets).   Do not take more than lorazepam 0.5mg  please.  I'm concerned more will affect your memory and increase your risk of falling.       Reschedule appointment with counselor.
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
## 2017-07-09 NOTE — Addendum Note (Signed)
Addended by: Despina Hidden on: 07/09/2017 11:44 AM   Modules accepted: Orders

## 2017-07-09 NOTE — Addendum Note (Signed)
Addended by: Despina Hidden on: 07/09/2017 11:49 AM   Modules accepted: Orders

## 2017-07-18 DIAGNOSIS — F418 Other specified anxiety disorders: Secondary | ICD-10-CM | POA: Diagnosis not present

## 2017-07-18 DIAGNOSIS — F329 Major depressive disorder, single episode, unspecified: Secondary | ICD-10-CM | POA: Diagnosis not present

## 2017-07-21 ENCOUNTER — Other Ambulatory Visit: Payer: Self-pay | Admitting: Internal Medicine

## 2017-07-23 ENCOUNTER — Telehealth: Payer: Self-pay | Admitting: Podiatry

## 2017-07-23 NOTE — Telephone Encounter (Signed)
rx called into pharmacy

## 2017-07-23 NOTE — Telephone Encounter (Signed)
Pt called and asked for you to please call him.

## 2017-07-25 ENCOUNTER — Telehealth: Payer: Self-pay | Admitting: *Deleted

## 2017-07-25 NOTE — Telephone Encounter (Signed)
Patient called and stated that his pharmacy does NOT have a supply of his Lorazepam and stated that they do not know when they will get a supply. They suggested patient to ask for a substitute Temazepam. Patient would like it called to his pharmacy [facility]. Stated he only has 1-2 more left. Please Advise.

## 2017-07-25 NOTE — Telephone Encounter (Signed)
Would prefer he get lorazepam from a different pharmacy if that's the case rather than switching medications.  Find out where else we can send the lorazepam

## 2017-07-26 MED ORDER — LORAZEPAM 0.5 MG PO TABS: ORAL_TABLET | ORAL | 0 refills | Status: DC

## 2017-07-26 NOTE — Telephone Encounter (Signed)
LMOM for patient to [facility].  

## 2017-07-26 NOTE — Telephone Encounter (Signed)
Patient stated that he will see which other pharmacy he wants to use and call us back.

## 2017-07-26 NOTE — Telephone Encounter (Signed)
Patient called back and said to send Rx to [facility]. Printed

## 2017-07-31 ENCOUNTER — Ambulatory Visit (INDEPENDENT_AMBULATORY_CARE_PROVIDER_SITE_OTHER): Payer: Medicare Other | Admitting: Podiatry

## 2017-07-31 ENCOUNTER — Encounter: Payer: Self-pay | Admitting: Podiatry

## 2017-07-31 DIAGNOSIS — M79675 Pain in left toe(s): Secondary | ICD-10-CM | POA: Diagnosis not present

## 2017-07-31 DIAGNOSIS — B351 Tinea unguium: Secondary | ICD-10-CM

## 2017-07-31 NOTE — Progress Notes (Signed)
Patient ID: Andrew Vega, male   DOB: 11-08-1925, 83 y.o.   MRN: [id number] Complaint:  Visit Type: Patient returns to my office for continued preventative foot care services. Complaint: Patient states" my nails have grown long and thick and become painful to walk and wear shoes". He presents for preventative foot care services. No changes to ROS.    Podiatric Exam: Vascular: dorsalis pedis and posterior tibial pulses are palpable bilateral. Capillary return is immediate. Temperature gradient is WNL. Skin turgor WNL  Sensorium: Normal Semmes Weinstein monofilament test. Normal tactile sensation bilaterally. Nail Exam: Pt has thick disfigured discolored nails with subungual debris noted bilateral entire nail hallux  Ulcer Exam: There is no evidence of ulcer or pre-ulcerative changes or infection. Orthopedic Exam: Muscle tone and strength are WNL. No limitations in general ROM. No crepitus or effusions noted. Foot type and digits show no abnormalities. Bony prominences are unremarkable. Skin: No Porokeratosis. No infection or ulcers.  Heloma durum fifth toe right foot asymptomatic.  Diagnosis:  Tinea unguium, Pain in right toe, pain in left toes   Treatment & Plan Procedures and Treatment: Consent by patient was obtained for treatment procedures. The patient understood the discussion of treatment and procedures well. All questions were answered thoroughly reviewed. Debridement of mycotic and hypertrophic toenails, 1 through 5 bilateral and clearing of subungual debris. No ulceration, no infection noted. ABN signed for 2018. Return Visit-Office Procedure: Patient instructed to return to the office for a follow up visit 3 months for continued evaluation and treatment.  Gardiner Barefoot DPM

## 2017-08-02 ENCOUNTER — Telehealth: Payer: Self-pay | Admitting: *Deleted

## 2017-08-02 NOTE — Telephone Encounter (Signed)
Received fax from [facility] in [place] for Placement 08/18/17.  I called patient and left message on his voicemail that he would need an appointment to have paperwork completed. PPD has to be placed.  Awaiting callback to schedule appointment. (Paperwork at Clinical Intake desk until appointment scheduled.

## 2017-08-05 ENCOUNTER — Other Ambulatory Visit: Payer: Self-pay | Admitting: Internal Medicine

## 2017-08-05 DIAGNOSIS — E785 Hyperlipidemia, unspecified: Secondary | ICD-10-CM

## 2017-08-08 ENCOUNTER — Ambulatory Visit (INDEPENDENT_AMBULATORY_CARE_PROVIDER_SITE_OTHER): Payer: Medicare Other | Admitting: Nurse Practitioner

## 2017-08-08 ENCOUNTER — Encounter: Payer: Self-pay | Admitting: Nurse Practitioner

## 2017-08-08 VITALS — BP 102/72 | HR 93 | Temp 98.4°F | Resp 18 | Ht 68.0 in | Wt 180.0 lb

## 2017-08-08 DIAGNOSIS — I6523 Occlusion and stenosis of bilateral carotid arteries: Secondary | ICD-10-CM | POA: Diagnosis not present

## 2017-08-08 DIAGNOSIS — G4700 Insomnia, unspecified: Secondary | ICD-10-CM

## 2017-08-08 DIAGNOSIS — F419 Anxiety disorder, unspecified: Secondary | ICD-10-CM | POA: Diagnosis not present

## 2017-08-08 DIAGNOSIS — F329 Major depressive disorder, single episode, unspecified: Secondary | ICD-10-CM | POA: Diagnosis not present

## 2017-08-08 DIAGNOSIS — J301 Allergic rhinitis due to pollen: Secondary | ICD-10-CM

## 2017-08-08 DIAGNOSIS — I714 Abdominal aortic aneurysm, without rupture, unspecified: Secondary | ICD-10-CM

## 2017-08-08 DIAGNOSIS — E785 Hyperlipidemia, unspecified: Secondary | ICD-10-CM

## 2017-08-08 DIAGNOSIS — F32A Depression, unspecified: Secondary | ICD-10-CM

## 2017-08-08 NOTE — Progress Notes (Signed)
Careteam: Patient Care Team: Gayland Curry, DO as PCP - General (Geriatric Medicine) Milus Banister, MD as Attending Physician (Gastroenterology)  Advanced Directive information Does Patient Have a Medical Advance Directive?: Yes, Type of Advance Directive: [place];Living will;Out of facility DNR (pink MOST or yellow form), Pre-existing out of facility DNR order (yellow form or pink MOST form): Yellow form placed in chart (order not valid for inpatient use)  Allergies  Allergen Reactions  . Celebrex [Celecoxib] Other (See Comments)    Raised B/P   . Codeine Other (See Comments)    Severe stomach pain     Chief Complaint  Patient presents with  . Acute Visit    Pt is being seen to have a FL2 completed. Pt is moving to [place] next week.      HPI: Patient is a 83 y.o. male seen in the office today for FL2 because he is moving to [place] to be closer to son. Pt with hx of anxiety/depression, AAA and bilateral carotid artery stenosis followed by vascular, insomnia, hyperlipidemia, seasonal allergies, cervical DDD. Pt has decided to move up [place] to be closer to his family. There are no acute concerns today. 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 Maximum aortic size was 5.3 cm. Dr. Trula Slade placed a proximal extension for a type I endoleak. The patient's postoperative course was uncomplicated.  He had a type II endoleak which was associated with aneurysm sac expansion. Therefore he underwent embolization in March 2016.has been asymptomatic.    Ongoing seborrheic dermatitis on left ear. Has been using ketoconazole but unsure if this has really been beneficial. Considering follow up at dermatologist once he moves.   Anxiety/depression/insomnia- due to planning on moving with his wife's decline due to dementia and her daughters taking over her care he feels like it is best to move closer to his  family. Currently stable on trazodone and lorazepam, reports he rarely uses 2 doses at this time. conts on melatonin which helps as well.   Hyperlipidemia- LDL 79 in January of 2018, recently admitted to increase sweets and was advised to work on diet.   Review of Systems:  Review of Systems  Constitutional: Positive for malaise/fatigue. Negative for chills, fever and weight loss.  HENT: Positive for hearing loss.        Sneezing  Eyes: Negative for blurred vision, discharge and redness.  Respiratory: Negative for cough, sputum production, shortness of breath and wheezing.   Cardiovascular: Negative for chest pain, palpitations and orthopnea.  Gastrointestinal: Negative for constipation and diarrhea.  Genitourinary: Negative for dysuria and urgency.  Musculoskeletal: Negative for back pain, falls, joint pain and neck pain.  Skin: Negative for rash.  Neurological: Negative for dizziness, tingling, weakness and headaches.  Psychiatric/Behavioral: Positive for depression. The patient is nervous/anxious and has insomnia.     Past Medical History:  Diagnosis Date  . Abdominal aneurysm without mention of rupture   . Anginal pain ([place])    recent hospitalization  . Anxiety   . Arthritis   . Cancer [facility]) Jan. 2017   skin cancer removal/ Right medial lower Leg  . Colon polyps   . COPD (chronic obstructive pulmonary disease) ([place])   . Diverticulosis of colon (without mention of hemorrhage)   . H/O hiatal hernia   . Hyperlipidemia   . Inguinal hernia   . Insomnia, unspecified    Past Surgical History:  Procedure  Laterality Date  . ABDOMINAL AORTIC ENDOVASCULAR STENT GRAFT N/A 10/29/2013   Procedure: ABDOMINAL AORTIC ENDOVASCULAR STENT GRAFT;  Surgeon: [name], MD;  Location: [facility];  Service: Vascular;  Laterality: N/A;  . bilateral shoulder repair    . CHOLECYSTECTOMY  1962  . COLON SURGERY  2007  . HERNIA REPAIR    . IR GENERIC HISTORICAL  12/24/2014   IR RADIOLOGIST EVAL &  MGMT 12/24/2014 Jacqulynn Cadet, MD [facility] INTERV RAD  . IR RADIOLOGIST EVAL & MGMT  02/27/2017  . MOHS SURGERY Right 11/2015   lower leg  . NASAL SEPTUM SURGERY  2011  . VASECTOMY  1962   Social History:   reports that he quit smoking about 32 years ago. His smoking use included Cigarettes. He has a 18.75 pack-year smoking history. He has never used smokeless tobacco. He reports that he drinks about 0.6 oz of alcohol per week . He reports that he does not use drugs.  Family History  Problem Relation Age of Onset  . Heart attack Father 82  . Colon cancer Neg Hx     Medications: Patient's Medications  New Prescriptions   No medications on file  Previous Medications   ASPIRIN 81 MG TABLET    Take 81 mg by mouth daily.   B COMPLEX-C (SUPER B COMPLEX PO)    Take 1 tablet by mouth daily.    CHLORPHENIRAMINE (CHLOR-TRIMETON) 4 MG TABLET    Take 4 mg by mouth daily as needed for allergies.   CHOLECALCIFEROL (VITAMIN D) 2000 UNITS CAPS    Take 2,000 Units by mouth daily.    FEXOFENADINE (ALLEGRA) 180 MG TABLET    Take 1 tablet (180 mg total) by mouth daily.   GLUCOSAMINE-CHONDROIT-VIT C-MN (GLUCOSAMINE CHONDR 1500 COMPLX PO)    Take 1 capsule by mouth daily.   KETOCONAZOLE (NIZORAL) 2 % CREAM    Apply 1 application topically daily.   LORAZEPAM (ATIVAN) 0.5 MG TABLET    TAKE 1 TABLET BY MOUTH EVERY NIGHT AT BEDTIME, TAKE AN ADDITIONAL TABLET AS NEEDED FOR NO SLEEP   MELATONIN 3 MG TABS    Take 2 tablets by mouth at bedtime.    MULTIPLE VITAMIN (MULTIVITAMIN) TABLET    Take 1 tablet by mouth daily.   NIACIN 100 MG TABLET    Take 250 mg by mouth daily with breakfast.    OMEGA-3 FATTY ACIDS (FISH OIL) 1000 MG CAPS    Take 1 capsule by mouth daily.   SIMVASTATIN (ZOCOR) 40 MG TABLET    TAKE 1 TABLET BY MOUTH AT BEDTIME FOR CHOLESTEROL   TRAZODONE (DESYREL) 50 MG TABLET    Take 3 tablets (150 mg total) by mouth at bedtime.   VITAMIN E 400 UNIT CAPSULE    Take 400 Units by mouth daily.  Modified  Medications   No medications on file  Discontinued Medications   No medications on file     Physical Exam:  Vitals:   08/08/17 1508  BP: 102/72  Pulse: 93  Resp: 18  Temp: 98.4 F (36.9 C)  TempSrc: Oral  SpO2: 95%  Weight: 180 lb (81.6 kg)  Height: 5\' 8"  (1.727 m)   Body mass index is 27.37 kg/m.  Physical Exam  Constitutional: He is oriented to person, place, and time. He appears well-developed and well-nourished. No distress.  HENT:  Head: Normocephalic and atraumatic.  Mouth/Throat: Oropharynx is clear and moist. No oropharyngeal exudate.  Eyes: Pupils are equal, round, and reactive to light.  Conjunctivae and EOM are normal.  Neck: Normal range of motion. Neck supple.  Cardiovascular: Normal rate, regular rhythm and normal heart sounds.   Pulmonary/Chest: Effort normal and breath sounds normal.  Abdominal: Soft. Bowel sounds are normal.  Musculoskeletal: He exhibits no edema or tenderness.  Neurological: He is alert and oriented to person, place, and time.  Skin: Skin is warm and dry. Capillary refill takes less than 2 seconds. He is not diaphoretic.  Psychiatric: He has a normal mood and affect. His behavior is normal. Thought content normal.  Admits to having some memory issues lately     Labs reviewed: Basic Metabolic Panel:  Recent Labs  11/14/16 0941 02/23/17 1058 02/23/17 1143 06/27/17 1002  NA 141 140 144 141  K 4.8 3.9 4.6 4.1  CL 108 110 107 108  CO2 25 22  --  25  GLUCOSE 108* 127* 129* 96  BUN 21 21* 28* 22  CREATININE 1.64* 1.71* 1.60* 1.59*  CALCIUM 9.3 9.3  --  9.2   Liver Function Tests:  Recent Labs  11/14/16 0941 02/23/17 1058 06/27/17 1002  AST 23 28 19   ALT 19 20 16   ALKPHOS 55 58  --   BILITOT 0.5 0.9 0.4  PROT 7.0 7.0 6.9  ALBUMIN 3.8 3.8  --     Recent Labs  02/23/17 1058  LIPASE 26   No results for input(s): AMMONIA in the last 8760 hours. CBC:  Recent Labs  11/14/16 0941 02/23/17 1058 02/23/17 1143  06/27/17 1002  WBC 6.5 6.4  --  6.1  NEUTROABS 3,510 3.7  --  3,453  HGB 13.8 13.1 11.2* 13.1*  HCT 40.8 38.2* 33.0* 38.1*  MCV 93.4 91.8  --  91.4  PLT 180 172  --  138*   Lipid Panel:  Recent Labs  11/14/16 0941  CHOL 154  HDL 40*  LDLCALC 79  TRIG 175*  CHOLHDL 3.9   TSH: No results for input(s): TSH in the last 8760 hours. A1C: No results found for: HGBA1C   Assessment/Plan  1. Insomnia, unspecified type -stable, using trazodone 100 mg by mouth at bedtime with lorazepam (additional dose if needed) and melatonin.  - traZODone (DESYREL) 50 MG tablet; Take 2 tablets (100 mg total) by mouth at bedtime.  Dispense: 90 tablet; Refill: 3  2. Non-seasonal allergic rhinitis due to pollen Stable on allergra daily, has had allergy since he has moved here from [place].   3. Anxiety and depression Stable, emotional support. Moving to be closer to family which will be beneficial.  Cont on current regimen.   4. Hyperlipidemia, unspecified hyperlipidemia type Cont on zocor and diet modifications.   5. Abdominal aortic aneurysm (AAA) without rupture (HCC) -stable, has been following with vein and vascular  FL2 completed.  Carlos American. Harle Battiest  [facility] [phone] 8 am - 5 pm) [phone] (after hours)

## 2017-08-08 NOTE — Patient Instructions (Signed)
We will call you when FL2 is signed

## 2017-08-14 ENCOUNTER — Telehealth: Payer: Self-pay | Admitting: *Deleted

## 2017-08-14 DIAGNOSIS — R05 Cough: Secondary | ICD-10-CM

## 2017-08-14 DIAGNOSIS — R059 Cough, unspecified: Secondary | ICD-10-CM

## 2017-08-14 DIAGNOSIS — G4700 Insomnia, unspecified: Secondary | ICD-10-CM

## 2017-08-14 DIAGNOSIS — L219 Seborrheic dermatitis, unspecified: Secondary | ICD-10-CM

## 2017-08-14 DIAGNOSIS — E785 Hyperlipidemia, unspecified: Secondary | ICD-10-CM

## 2017-08-14 MED ORDER — MELATONIN 3 MG PO TABS: 2.0000 | ORAL_TABLET | Freq: Every day | ORAL | 0 refills | Status: AC

## 2017-08-14 MED ORDER — VITAMIN D 50 MCG (2000 UT) PO CAPS: 2000.0000 [IU] | ORAL_CAPSULE | Freq: Every day | ORAL | 0 refills | Status: AC

## 2017-08-14 MED ORDER — TRAZODONE HCL 50 MG PO TABS: 100.0000 mg | ORAL_TABLET | Freq: Every day | ORAL | 0 refills | Status: DC

## 2017-08-14 MED ORDER — ONE-DAILY MULTI VITAMINS PO TABS: 1.0000 | ORAL_TABLET | Freq: Every day | ORAL | 0 refills | Status: AC

## 2017-08-14 MED ORDER — NIACIN 100 MG PO TABS: 250.0000 mg | ORAL_TABLET | Freq: Every day | ORAL | 0 refills | Status: AC

## 2017-08-14 MED ORDER — FEXOFENADINE HCL 180 MG PO TABS: 180.0000 mg | ORAL_TABLET | Freq: Every day | ORAL | 0 refills | Status: AC

## 2017-08-14 MED ORDER — VITAMIN E 180 MG (400 UNIT) PO CAPS: 400.0000 [IU] | ORAL_CAPSULE | Freq: Every day | ORAL | 0 refills | Status: AC

## 2017-08-14 MED ORDER — SIMVASTATIN 40 MG PO TABS: ORAL_TABLET | ORAL | 0 refills | Status: DC

## 2017-08-14 MED ORDER — ASPIRIN 81 MG PO TABS: 81.0000 mg | ORAL_TABLET | Freq: Every day | ORAL | 0 refills | Status: AC

## 2017-08-14 MED ORDER — CHLORPHENIRAMINE MALEATE 4 MG PO TABS: 4.0000 mg | ORAL_TABLET | Freq: Every day | ORAL | 0 refills | Status: AC | PRN

## 2017-08-14 MED ORDER — FISH OIL 1000 MG PO CAPS: 1.0000 | ORAL_CAPSULE | Freq: Every day | ORAL | 0 refills | Status: AC

## 2017-08-14 MED ORDER — TRAZODONE HCL 50 MG PO TABS: 100.0000 mg | ORAL_TABLET | Freq: Every day | ORAL | 3 refills | Status: DC

## 2017-08-14 MED ORDER — KETOCONAZOLE 2 % EX CREA: 1.0000 "application " | TOPICAL_CREAM | Freq: Every day | CUTANEOUS | 0 refills | Status: AC

## 2017-08-14 NOTE — Telephone Encounter (Signed)
I left a message on patient's voicemail stating that requested paperwork has been completed and is ready to be picked up. Paperwork was placed in front filing cabinet.

## 2017-08-14 NOTE — Telephone Encounter (Signed)
Patient called and stated that he never received a call regarding his Facility Placement Paperwork [facility]) being completed. Stated that he needs this and his Rx's before Friday because that is the move in date. Please Advise.

## 2017-08-21 DIAGNOSIS — Z111 Encounter for screening for respiratory tuberculosis: Secondary | ICD-10-CM | POA: Diagnosis not present

## 2017-08-22 DIAGNOSIS — F320 Major depressive disorder, single episode, mild: Secondary | ICD-10-CM | POA: Diagnosis not present

## 2017-08-22 DIAGNOSIS — I714 Abdominal aortic aneurysm, without rupture: Secondary | ICD-10-CM | POA: Diagnosis not present

## 2017-08-22 DIAGNOSIS — E7849 Other hyperlipidemia: Secondary | ICD-10-CM | POA: Diagnosis not present

## 2017-08-22 DIAGNOSIS — F418 Other specified anxiety disorders: Secondary | ICD-10-CM | POA: Diagnosis not present

## 2017-08-22 DIAGNOSIS — H9193 Unspecified hearing loss, bilateral: Secondary | ICD-10-CM | POA: Diagnosis not present

## 2017-08-23 DIAGNOSIS — I2510 Atherosclerotic heart disease of native coronary artery without angina pectoris: Secondary | ICD-10-CM | POA: Diagnosis not present

## 2017-09-05 ENCOUNTER — Ambulatory Visit: Payer: Medicare Other | Admitting: Podiatry

## 2017-09-12 ENCOUNTER — Other Ambulatory Visit: Payer: Self-pay

## 2017-09-12 DIAGNOSIS — E785 Hyperlipidemia, unspecified: Secondary | ICD-10-CM

## 2017-09-12 DIAGNOSIS — G4700 Insomnia, unspecified: Secondary | ICD-10-CM

## 2017-09-12 MED ORDER — SIMVASTATIN 40 MG PO TABS: ORAL_TABLET | ORAL | 0 refills | Status: DC

## 2017-09-12 MED ORDER — LORAZEPAM 0.5 MG PO TABS: ORAL_TABLET | ORAL | 0 refills | Status: AC

## 2017-09-12 MED ORDER — TRAZODONE HCL 50 MG PO TABS: 100.0000 mg | ORAL_TABLET | Freq: Every day | ORAL | 0 refills | Status: DC

## 2017-09-12 NOTE — Telephone Encounter (Signed)
Patient has relocated to [place] and called to request a 30 day supply of medications until he is able to establish with a provider in [place].  RX's sent/called in

## 2017-09-14 ENCOUNTER — Other Ambulatory Visit: Payer: Self-pay | Admitting: Internal Medicine

## 2017-09-14 DIAGNOSIS — E785 Hyperlipidemia, unspecified: Secondary | ICD-10-CM

## 2017-09-15 ENCOUNTER — Other Ambulatory Visit: Payer: Self-pay | Admitting: Internal Medicine

## 2017-09-15 DIAGNOSIS — G4700 Insomnia, unspecified: Secondary | ICD-10-CM

## 2017-09-19 ENCOUNTER — Other Ambulatory Visit: Payer: Self-pay | Admitting: Internal Medicine

## 2017-09-19 DIAGNOSIS — E785 Hyperlipidemia, unspecified: Secondary | ICD-10-CM

## 2017-09-26 DIAGNOSIS — I714 Abdominal aortic aneurysm, without rupture: Secondary | ICD-10-CM | POA: Diagnosis not present

## 2017-09-26 DIAGNOSIS — F418 Other specified anxiety disorders: Secondary | ICD-10-CM | POA: Diagnosis not present

## 2017-09-26 DIAGNOSIS — Z7189 Other specified counseling: Secondary | ICD-10-CM | POA: Diagnosis not present

## 2017-09-26 DIAGNOSIS — N183 Chronic kidney disease, stage 3 (moderate): Secondary | ICD-10-CM | POA: Diagnosis not present

## 2017-09-26 DIAGNOSIS — F320 Major depressive disorder, single episode, mild: Secondary | ICD-10-CM | POA: Diagnosis not present

## 2017-09-26 DIAGNOSIS — H9193 Unspecified hearing loss, bilateral: Secondary | ICD-10-CM | POA: Diagnosis not present

## 2017-09-26 DIAGNOSIS — E782 Mixed hyperlipidemia: Secondary | ICD-10-CM | POA: Diagnosis not present

## 2017-10-18 ENCOUNTER — Ambulatory Visit: Payer: Medicare Other | Admitting: Internal Medicine

## 2017-10-21 ENCOUNTER — Other Ambulatory Visit: Payer: Self-pay | Admitting: Internal Medicine

## 2017-10-21 DIAGNOSIS — G4700 Insomnia, unspecified: Secondary | ICD-10-CM

## 2017-10-30 ENCOUNTER — Ambulatory Visit: Payer: Medicare Other | Admitting: Podiatry

## 2017-10-30 DIAGNOSIS — H9193 Unspecified hearing loss, bilateral: Secondary | ICD-10-CM | POA: Diagnosis not present

## 2017-10-30 DIAGNOSIS — I714 Abdominal aortic aneurysm, without rupture: Secondary | ICD-10-CM | POA: Diagnosis not present

## 2017-10-30 DIAGNOSIS — F320 Major depressive disorder, single episode, mild: Secondary | ICD-10-CM | POA: Diagnosis not present

## 2017-10-30 DIAGNOSIS — N183 Chronic kidney disease, stage 3 (moderate): Secondary | ICD-10-CM | POA: Diagnosis not present

## 2017-10-30 DIAGNOSIS — E782 Mixed hyperlipidemia: Secondary | ICD-10-CM | POA: Diagnosis not present

## 2017-10-30 DIAGNOSIS — F418 Other specified anxiety disorders: Secondary | ICD-10-CM | POA: Diagnosis not present

## 2017-11-12 ENCOUNTER — Other Ambulatory Visit: Payer: Self-pay | Admitting: Nurse Practitioner

## 2017-11-12 DIAGNOSIS — G4700 Insomnia, unspecified: Secondary | ICD-10-CM

## 2017-11-21 DIAGNOSIS — F4321 Adjustment disorder with depressed mood: Secondary | ICD-10-CM | POA: Diagnosis not present

## 2017-12-03 DIAGNOSIS — I739 Peripheral vascular disease, unspecified: Secondary | ICD-10-CM | POA: Diagnosis not present

## 2017-12-03 DIAGNOSIS — B351 Tinea unguium: Secondary | ICD-10-CM | POA: Diagnosis not present

## 2017-12-05 DIAGNOSIS — F4321 Adjustment disorder with depressed mood: Secondary | ICD-10-CM | POA: Diagnosis not present

## 2017-12-06 DIAGNOSIS — Z1389 Encounter for screening for other disorder: Secondary | ICD-10-CM | POA: Diagnosis not present

## 2017-12-06 DIAGNOSIS — R918 Other nonspecific abnormal finding of lung field: Secondary | ICD-10-CM | POA: Diagnosis not present

## 2017-12-06 DIAGNOSIS — R42 Dizziness and giddiness: Secondary | ICD-10-CM | POA: Diagnosis not present

## 2017-12-06 DIAGNOSIS — H6121 Impacted cerumen, right ear: Secondary | ICD-10-CM | POA: Diagnosis not present

## 2017-12-06 DIAGNOSIS — G4700 Insomnia, unspecified: Secondary | ICD-10-CM | POA: Diagnosis not present

## 2017-12-06 DIAGNOSIS — E785 Hyperlipidemia, unspecified: Secondary | ICD-10-CM | POA: Diagnosis not present

## 2017-12-06 DIAGNOSIS — J309 Allergic rhinitis, unspecified: Secondary | ICD-10-CM | POA: Diagnosis not present

## 2017-12-06 DIAGNOSIS — R309 Painful micturition, unspecified: Secondary | ICD-10-CM | POA: Diagnosis not present

## 2017-12-10 DIAGNOSIS — R197 Diarrhea, unspecified: Secondary | ICD-10-CM | POA: Diagnosis not present

## 2017-12-11 DIAGNOSIS — E785 Hyperlipidemia, unspecified: Secondary | ICD-10-CM | POA: Diagnosis not present

## 2017-12-11 DIAGNOSIS — M2550 Pain in unspecified joint: Secondary | ICD-10-CM | POA: Diagnosis not present

## 2017-12-11 DIAGNOSIS — R760 Raised antibody titer: Secondary | ICD-10-CM | POA: Diagnosis not present

## 2017-12-11 DIAGNOSIS — R799 Abnormal finding of blood chemistry, unspecified: Secondary | ICD-10-CM | POA: Diagnosis not present

## 2017-12-11 DIAGNOSIS — R740 Nonspecific elevation of levels of transaminase and lactic acid dehydrogenase [LDH]: Secondary | ICD-10-CM | POA: Diagnosis not present

## 2017-12-11 DIAGNOSIS — E538 Deficiency of other specified B group vitamins: Secondary | ICD-10-CM | POA: Diagnosis not present

## 2017-12-11 DIAGNOSIS — R972 Elevated prostate specific antigen [PSA]: Secondary | ICD-10-CM | POA: Diagnosis not present

## 2017-12-11 DIAGNOSIS — R5381 Other malaise: Secondary | ICD-10-CM | POA: Diagnosis not present

## 2017-12-11 DIAGNOSIS — E559 Vitamin D deficiency, unspecified: Secondary | ICD-10-CM | POA: Diagnosis not present

## 2017-12-11 DIAGNOSIS — Z125 Encounter for screening for malignant neoplasm of prostate: Secondary | ICD-10-CM | POA: Diagnosis not present

## 2017-12-12 DIAGNOSIS — F4321 Adjustment disorder with depressed mood: Secondary | ICD-10-CM | POA: Diagnosis not present

## 2017-12-18 DIAGNOSIS — F4321 Adjustment disorder with depressed mood: Secondary | ICD-10-CM | POA: Diagnosis not present

## 2017-12-25 DIAGNOSIS — F4321 Adjustment disorder with depressed mood: Secondary | ICD-10-CM | POA: Diagnosis not present

## 2018-01-15 DIAGNOSIS — F4321 Adjustment disorder with depressed mood: Secondary | ICD-10-CM | POA: Diagnosis not present

## 2018-01-17 DIAGNOSIS — G4700 Insomnia, unspecified: Secondary | ICD-10-CM | POA: Diagnosis not present

## 2018-01-17 DIAGNOSIS — H6122 Impacted cerumen, left ear: Secondary | ICD-10-CM | POA: Diagnosis not present

## 2018-01-17 DIAGNOSIS — J309 Allergic rhinitis, unspecified: Secondary | ICD-10-CM | POA: Diagnosis not present

## 2018-01-17 DIAGNOSIS — E785 Hyperlipidemia, unspecified: Secondary | ICD-10-CM | POA: Diagnosis not present

## 2018-01-17 DIAGNOSIS — R760 Raised antibody titer: Secondary | ICD-10-CM | POA: Diagnosis not present

## 2018-01-17 DIAGNOSIS — R030 Elevated blood-pressure reading, without diagnosis of hypertension: Secondary | ICD-10-CM | POA: Diagnosis not present

## 2018-01-17 DIAGNOSIS — R799 Abnormal finding of blood chemistry, unspecified: Secondary | ICD-10-CM | POA: Diagnosis not present

## 2018-01-17 DIAGNOSIS — R002 Palpitations: Secondary | ICD-10-CM | POA: Diagnosis not present

## 2018-01-17 DIAGNOSIS — M79606 Pain in leg, unspecified: Secondary | ICD-10-CM | POA: Diagnosis not present

## 2018-01-17 DIAGNOSIS — Z0000 Encounter for general adult medical examination without abnormal findings: Secondary | ICD-10-CM | POA: Diagnosis not present

## 2018-02-07 DIAGNOSIS — I10 Essential (primary) hypertension: Secondary | ICD-10-CM | POA: Diagnosis not present

## 2018-02-07 DIAGNOSIS — E782 Mixed hyperlipidemia: Secondary | ICD-10-CM | POA: Diagnosis not present

## 2018-02-12 DIAGNOSIS — F4321 Adjustment disorder with depressed mood: Secondary | ICD-10-CM | POA: Diagnosis not present

## 2018-02-14 ENCOUNTER — Encounter (HOSPITAL_COMMUNITY): Payer: Medicare Other

## 2018-02-14 ENCOUNTER — Other Ambulatory Visit (HOSPITAL_COMMUNITY): Payer: Medicare Other

## 2018-02-14 ENCOUNTER — Ambulatory Visit: Payer: Medicare Other | Admitting: Family

## 2018-02-19 DIAGNOSIS — M79606 Pain in leg, unspecified: Secondary | ICD-10-CM | POA: Diagnosis not present

## 2018-02-19 DIAGNOSIS — E785 Hyperlipidemia, unspecified: Secondary | ICD-10-CM | POA: Diagnosis not present

## 2018-02-19 DIAGNOSIS — R42 Dizziness and giddiness: Secondary | ICD-10-CM | POA: Diagnosis not present

## 2018-02-19 DIAGNOSIS — M79646 Pain in unspecified finger(s): Secondary | ICD-10-CM | POA: Diagnosis not present

## 2018-02-19 DIAGNOSIS — R799 Abnormal finding of blood chemistry, unspecified: Secondary | ICD-10-CM | POA: Diagnosis not present

## 2018-02-19 DIAGNOSIS — G4700 Insomnia, unspecified: Secondary | ICD-10-CM | POA: Diagnosis not present

## 2018-02-19 DIAGNOSIS — J309 Allergic rhinitis, unspecified: Secondary | ICD-10-CM | POA: Diagnosis not present

## 2018-02-19 DIAGNOSIS — F419 Anxiety disorder, unspecified: Secondary | ICD-10-CM | POA: Diagnosis not present

## 2018-02-19 DIAGNOSIS — R030 Elevated blood-pressure reading, without diagnosis of hypertension: Secondary | ICD-10-CM | POA: Diagnosis not present

## 2018-02-20 DIAGNOSIS — F4321 Adjustment disorder with depressed mood: Secondary | ICD-10-CM | POA: Diagnosis not present

## 2018-02-21 DIAGNOSIS — I739 Peripheral vascular disease, unspecified: Secondary | ICD-10-CM | POA: Diagnosis not present

## 2018-02-21 DIAGNOSIS — B351 Tinea unguium: Secondary | ICD-10-CM | POA: Diagnosis not present

## 2018-02-27 DIAGNOSIS — F4321 Adjustment disorder with depressed mood: Secondary | ICD-10-CM | POA: Diagnosis not present

## 2018-03-01 DIAGNOSIS — I10 Essential (primary) hypertension: Secondary | ICD-10-CM | POA: Diagnosis not present

## 2018-03-01 DIAGNOSIS — E782 Mixed hyperlipidemia: Secondary | ICD-10-CM | POA: Diagnosis not present

## 2018-03-06 DIAGNOSIS — F4321 Adjustment disorder with depressed mood: Secondary | ICD-10-CM | POA: Diagnosis not present

## 2018-03-12 DIAGNOSIS — E785 Hyperlipidemia, unspecified: Secondary | ICD-10-CM | POA: Diagnosis not present

## 2018-03-12 DIAGNOSIS — R918 Other nonspecific abnormal finding of lung field: Secondary | ICD-10-CM | POA: Diagnosis not present

## 2018-03-12 DIAGNOSIS — R030 Elevated blood-pressure reading, without diagnosis of hypertension: Secondary | ICD-10-CM | POA: Diagnosis not present

## 2018-03-12 DIAGNOSIS — G4700 Insomnia, unspecified: Secondary | ICD-10-CM | POA: Diagnosis not present

## 2018-03-12 DIAGNOSIS — R799 Abnormal finding of blood chemistry, unspecified: Secondary | ICD-10-CM | POA: Diagnosis not present

## 2018-03-12 DIAGNOSIS — J309 Allergic rhinitis, unspecified: Secondary | ICD-10-CM | POA: Diagnosis not present

## 2018-03-12 DIAGNOSIS — M79646 Pain in unspecified finger(s): Secondary | ICD-10-CM | POA: Diagnosis not present

## 2018-03-12 DIAGNOSIS — H6121 Impacted cerumen, right ear: Secondary | ICD-10-CM | POA: Diagnosis not present

## 2018-03-13 DIAGNOSIS — F4321 Adjustment disorder with depressed mood: Secondary | ICD-10-CM | POA: Diagnosis not present

## 2018-04-02 DIAGNOSIS — E782 Mixed hyperlipidemia: Secondary | ICD-10-CM | POA: Diagnosis not present

## 2018-04-02 DIAGNOSIS — I10 Essential (primary) hypertension: Secondary | ICD-10-CM | POA: Diagnosis not present

## 2018-05-22 DIAGNOSIS — E782 Mixed hyperlipidemia: Secondary | ICD-10-CM | POA: Diagnosis not present

## 2018-05-22 DIAGNOSIS — G4700 Insomnia, unspecified: Secondary | ICD-10-CM | POA: Diagnosis not present

## 2018-05-22 DIAGNOSIS — J309 Allergic rhinitis, unspecified: Secondary | ICD-10-CM | POA: Diagnosis not present

## 2018-06-06 DIAGNOSIS — I70203 Unspecified atherosclerosis of native arteries of extremities, bilateral legs: Secondary | ICD-10-CM | POA: Diagnosis not present

## 2018-06-06 DIAGNOSIS — B351 Tinea unguium: Secondary | ICD-10-CM | POA: Diagnosis not present

## 2018-06-11 DIAGNOSIS — S39012A Strain of muscle, fascia and tendon of lower back, initial encounter: Secondary | ICD-10-CM | POA: Diagnosis not present

## 2018-08-19 DIAGNOSIS — E782 Mixed hyperlipidemia: Secondary | ICD-10-CM | POA: Diagnosis not present

## 2018-08-19 DIAGNOSIS — J309 Allergic rhinitis, unspecified: Secondary | ICD-10-CM | POA: Diagnosis not present

## 2018-08-21 DIAGNOSIS — M79675 Pain in left toe(s): Secondary | ICD-10-CM | POA: Diagnosis not present

## 2018-08-21 DIAGNOSIS — B351 Tinea unguium: Secondary | ICD-10-CM | POA: Diagnosis not present

## 2018-08-21 DIAGNOSIS — M79674 Pain in right toe(s): Secondary | ICD-10-CM | POA: Diagnosis not present

## 2024-10-23 DEATH — deceased
